# Patient Record
Sex: Male | Born: 1945 | Race: Black or African American | Hispanic: No | Marital: Married | Smoking: Former smoker
Health system: Southern US, Community
[De-identification: ages and names within clinical notes are randomized; demographics above are authoritative.]

## PROBLEM LIST (undated history)

## (undated) DIAGNOSIS — K219 Gastro-esophageal reflux disease without esophagitis: Secondary | ICD-10-CM

## (undated) DIAGNOSIS — I10 Essential (primary) hypertension: Secondary | ICD-10-CM

## (undated) DIAGNOSIS — E7800 Pure hypercholesterolemia, unspecified: Secondary | ICD-10-CM

## (undated) DIAGNOSIS — H409 Unspecified glaucoma: Secondary | ICD-10-CM

## (undated) DIAGNOSIS — D496 Neoplasm of unspecified behavior of brain: Secondary | ICD-10-CM

## (undated) DIAGNOSIS — C61 Malignant neoplasm of prostate: Secondary | ICD-10-CM

## (undated) DIAGNOSIS — K529 Noninfective gastroenteritis and colitis, unspecified: Secondary | ICD-10-CM

---

## 1992-10-23 HISTORY — PX: BRAIN TUMOR EXCISION: SHX577

## 2006-07-24 ENCOUNTER — Ambulatory Visit: Payer: Self-pay | Admitting: Gastroenterology

## 2006-07-24 ENCOUNTER — Ambulatory Visit (HOSPITAL_COMMUNITY): Admission: RE | Admit: 2006-07-24 | Discharge: 2006-07-24 | Payer: Self-pay | Admitting: Gastroenterology

## 2011-07-13 ENCOUNTER — Encounter: Payer: Self-pay | Admitting: Gastroenterology

## 2011-07-17 ENCOUNTER — Encounter: Payer: Self-pay | Admitting: Gastroenterology

## 2013-10-24 ENCOUNTER — Other Ambulatory Visit (HOSPITAL_COMMUNITY): Payer: Self-pay | Admitting: Urology

## 2013-10-24 DIAGNOSIS — C61 Malignant neoplasm of prostate: Secondary | ICD-10-CM

## 2013-10-28 ENCOUNTER — Other Ambulatory Visit: Payer: Self-pay | Admitting: Urology

## 2013-11-05 ENCOUNTER — Encounter (HOSPITAL_COMMUNITY): Payer: Self-pay

## 2013-11-07 ENCOUNTER — Encounter (HOSPITAL_COMMUNITY)
Admission: RE | Admit: 2013-11-07 | Discharge: 2013-11-07 | Disposition: A | Discharge status: Home / Self Care | Payer: 59 | Source: Ambulatory Visit | Attending: Urology | Admitting: Urology

## 2013-11-07 ENCOUNTER — Encounter (HOSPITAL_COMMUNITY): Payer: Self-pay | Admitting: Pharmacy Technician

## 2013-11-07 DIAGNOSIS — C61 Malignant neoplasm of prostate: Secondary | ICD-10-CM | POA: Insufficient documentation

## 2013-11-07 MED ORDER — TECHNETIUM TC 99M MEDRONATE IV KIT
25.0000 | PACK | Freq: Once | INTRAVENOUS | Status: AC | PRN
  Administered 2013-11-07: 25 via INTRAVENOUS

## 2013-11-11 ENCOUNTER — Encounter (HOSPITAL_COMMUNITY)
Admission: RE | Admit: 2013-11-11 | Discharge: 2013-11-11 | Disposition: A | Discharge status: Home / Self Care | Payer: 59 | Source: Ambulatory Visit | Attending: Urology | Admitting: Urology

## 2013-11-11 ENCOUNTER — Ambulatory Visit (HOSPITAL_COMMUNITY)
Admission: RE | Admit: 2013-11-11 | Discharge: 2013-11-11 | Disposition: A | Discharge status: Home / Self Care | Payer: 59 | Source: Ambulatory Visit | Attending: Urology | Admitting: Urology

## 2013-11-11 ENCOUNTER — Encounter (HOSPITAL_COMMUNITY): Payer: Self-pay

## 2013-11-11 ENCOUNTER — Other Ambulatory Visit (HOSPITAL_COMMUNITY): Payer: Self-pay | Admitting: Urology

## 2013-11-11 DIAGNOSIS — Z0183 Encounter for blood typing: Secondary | ICD-10-CM | POA: Insufficient documentation

## 2013-11-11 DIAGNOSIS — Z01810 Encounter for preprocedural cardiovascular examination: Secondary | ICD-10-CM | POA: Insufficient documentation

## 2013-11-11 DIAGNOSIS — Z01818 Encounter for other preprocedural examination: Secondary | ICD-10-CM | POA: Insufficient documentation

## 2013-11-11 DIAGNOSIS — R52 Pain, unspecified: Secondary | ICD-10-CM

## 2013-11-11 DIAGNOSIS — C61 Malignant neoplasm of prostate: Secondary | ICD-10-CM | POA: Insufficient documentation

## 2013-11-11 DIAGNOSIS — Z01812 Encounter for preprocedural laboratory examination: Secondary | ICD-10-CM | POA: Insufficient documentation

## 2013-11-11 DIAGNOSIS — M47814 Spondylosis without myelopathy or radiculopathy, thoracic region: Secondary | ICD-10-CM | POA: Insufficient documentation

## 2013-11-11 DIAGNOSIS — M259 Joint disorder, unspecified: Secondary | ICD-10-CM | POA: Insufficient documentation

## 2013-11-11 DIAGNOSIS — M949 Disorder of cartilage, unspecified: Secondary | ICD-10-CM

## 2013-11-11 DIAGNOSIS — M899 Disorder of bone, unspecified: Secondary | ICD-10-CM | POA: Insufficient documentation

## 2013-11-11 DIAGNOSIS — I10 Essential (primary) hypertension: Secondary | ICD-10-CM | POA: Insufficient documentation

## 2013-11-11 HISTORY — DX: Essential (primary) hypertension: I10

## 2013-11-11 HISTORY — DX: Gastro-esophageal reflux disease without esophagitis: K21.9

## 2013-11-11 HISTORY — DX: Malignant neoplasm of prostate: C61

## 2013-11-11 LAB — BASIC METABOLIC PANEL
BUN: 11 mg/dL (ref 6–23)
CALCIUM: 9.5 mg/dL (ref 8.4–10.5)
CO2: 27 mEq/L (ref 19–32)
Chloride: 103 mEq/L (ref 96–112)
Creatinine, Ser: 0.99 mg/dL (ref 0.50–1.35)
GFR calc Af Amer: >90 mL/min (ref >90)
GFR, EST NON AFRICAN AMERICAN: 84 mL/min — AB (ref >90)
Glucose, Bld: 109 mg/dL — ABNORMAL HIGH (ref 70–99)
Potassium: 3.4 mEq/L — ABNORMAL LOW (ref 3.7–5.3)
SODIUM: 143 meq/L (ref 137–147)

## 2013-11-11 LAB — CBC
HCT: 45.2 % (ref 39.0–52.0)
Hemoglobin: 15.3 g/dL (ref 13.0–17.0)
MCH: 29.2 pg (ref 26.0–34.0)
MCHC: 33.8 g/dL (ref 30.0–36.0)
MCV: 86.3 fL (ref 78.0–100.0)
PLATELETS: 298 10*3/uL (ref 150–400)
RBC: 5.24 MIL/uL (ref 4.22–5.81)
RDW: 13.8 % (ref 11.5–15.5)
WBC: 8.2 10*3/uL (ref 4.0–10.5)

## 2013-11-11 LAB — ABO/RH: ABO/RH(D): B POS

## 2013-11-11 NOTE — Patient Instructions (Signed)
Curtis Marquez  11/11/2013                           YOUR PROCEDURE IS SCHEDULED ON: 11/13/13               PLEASE REPORT TO SHORT STAY CENTER AT : 5:15 AM               CALL THIS NUMBER IF ANY PROBLEMS THE DAY OF SURGERY :               [phone]                      REMEMBER:   Do not eat food or drink liquids AFTER MIDNIGHT   Take these medicines the morning of surgery with A SIP OF WATER: OMEPRAZOLE   Do not wear jewelry, make-up   Do not wear lotions, powders, or perfumes.   Do not shave legs or underarms 12 hrs. before surgery (men may shave face)  Do not bring valuables to the hospital.  Contacts, dentures or bridgework may not be worn into surgery.  Leave suitcase in the car. After surgery it may be brought to your room.  For patients admitted to the hospital more than one night, checkout time is 11:00                          The day of discharge.   Patients discharged the day of surgery will not be allowed to drive home                             If going home same day of surgery, must have someone stay with you first                           24 hrs at home and arrange for some one to drive you home from hospital.    Special Instructions:   Please read over the following fact sheets that you were given:               1. [employer]ENT[employer]OM[employer]ER                      2. [place]                                                X_____________________________________________________________________        Failure to follow these instructions may result in cancellation of your surgery

## 2013-11-11 NOTE — Progress Notes (Signed)
11/11/13 1018  OBSTRUCTIVE SLEEP APNEA  Have you ever been diagnosed with sleep apnea through a sleep study? No  Do you snore loudly (loud enough to be heard through closed doors)?  1  Do you often feel tired, fatigued, or sleepy during the daytime? 0  Has anyone observed you stop breathing during your sleep? 1  Do you have, or are you being treated for high blood pressure? 1  BMI more than 35 kg/m2? 0  Age over 65 years old? 1  Neck circumference greater than 40 cm/18 inches? 0  Gender: 1  Obstructive Sleep Apnea Score 5  Score 4 or greater  Results sent to PCP

## 2013-11-12 NOTE — Anesthesia Preprocedure Evaluation (Addendum)
Anesthesia Evaluation  Patient identified by MRN, date of birth, ID band Patient awake    Reviewed: Allergy & Precautions, H&P , NPO status , Patient's Chart, lab work & pertinent test results  Airway Mallampati: II TM Distance: >3 FB Neck ROM: Full    Dental no notable dental hx.    Pulmonary neg pulmonary ROS,  breath sounds clear to auscultation  Pulmonary exam normal       Cardiovascular hypertension, Pt. on medications Rhythm:Regular Rate:Normal     Neuro/Psych negative neurological ROS  negative psych ROS   GI/Hepatic Neg liver ROS, GERD-  Medicated,  Endo/Other  negative endocrine ROS  Renal/GU negative Renal ROS  negative genitourinary   Musculoskeletal negative musculoskeletal ROS (+)   Abdominal   Peds negative pediatric ROS (+)  Hematology negative hematology ROS (+)   Anesthesia Other Findings   Reproductive/Obstetrics negative OB ROS                         Anesthesia Physical Anesthesia Plan  ASA: II  Anesthesia Plan: General   Post-op Pain Management:    Induction: Intravenous  Airway Management Planned: Oral ETT  Additional Equipment:   Intra-op Plan:   Post-operative Plan: Extubation in OR  Informed Consent: I have reviewed the patients History and Physical, chart, labs and discussed the procedure including the risks, benefits and alternatives for the proposed anesthesia with the patient or authorized representative who has indicated his/her understanding and acceptance.   Dental advisory given  Plan Discussed with: CRNA and Surgeon  Anesthesia Plan Comments:         Anesthesia Quick Evaluation

## 2013-11-12 NOTE — H&P (Signed)
Chief Complaint Prostate Cancer   Reason For Visit Reason for consult: To discuss treatment options for prostate cancer and specifically to consider a robotic prostatectomy.  Physician requesting consult: Dr. Clyde Lundborg  PCP: Dr. Monico Blitz   History of Present Illness Curtis Marquez is a 68 year old gentleman who was found to have an elevated PSA of 11.5. He was empirically treated with ciprofloxacin and his repeat PSA remained elevated at 12.3. He underwent a prostate biopsy for this reason on 09/23/13. 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 He has no family history of prostate cancer. He did undergo a CT scan of the pelvis on 10/03/13 which demonstrated no pelvic lymphadenopathy or other evidence of metastatic disease.    TNM stage: cT2a N0 M0 (L apical induration)  PSA: 12.3  Gleason score: 4+4=8  Biopsy (09/23/13): 12/16 cores positive    Left: L apex (4+4=8, 65% of tissue, 2/2 cores), L mid (4+3=7, 60% of tissue, 3/3 cores), L base (4+4=8, 5% of tissue, 1/3 cores)    Right: R apex (3+3=6, 6% of tissue, 1/2 cores), R mid (4+3=7, 30% of tissue, 3/3 cores), R base (4+3=7, 30%, 2/3 cores)  Prostate volume: 15.3 cc    Nomogram:  OC disease: 47%  EPE: 44%  SVI: 32%  LNI: 6.7%  PFS (surgery): 64% at 5 years, 50% at 10 years    Urinary function: He does describe some moderate lower urinary tract symptoms including a weak stream, urinary frequency, nocturia, and urgency. IPSS is 9. Overall, his symptoms are not particularly bothersome to him.  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 He states that he can attain an erection adequate for intercourse approximately 50% of the time. He has not previously undergone medical therapy. SHIM score is 17.   Past Medical History Problems  1. History of gastroesophageal reflux (GERD) (V12.79) 2. History of hypercholesterolemia  (V12.29) 3. History of hypertension (V12.59)  Surgical History Problems  1. 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   Current Meds 1. Allergy TABS;  Therapy: (Recorded:02Jan2015) to Recorded 2. Benicar HCT 40-12.5 MG Oral Tablet;  Therapy: (Recorded:02Jan2015) to Recorded 3. CloNIDine HCl TABS;  Therapy: (UYQIHKVQ:[id number]) to Recorded 4. Hydrochlorothiazide 25 MG Oral Tablet;  Therapy: (IEPPIRJJ:[id number]) to Recorded 5. Omeprazole 20 MG Oral Capsule Delayed Release;  Therapy: (Recorded:02Jan2015) to Recorded 6. Tamsulosin HCl - 0.4 MG Oral Capsule;  Therapy: (Recorded:02Jan2015) to Recorded  Allergies Medication  1. No Known Drug Allergies  Family History Problems  1. No pertinent family history : Mother  Social History Problems    Denied: History of Alcohol use   Caffeine use (V49.89)   1 cup   Former smoker [place])  Review of Systems Constitutional, skin, eye, otolaryngeal, hematologic/lymphatic, cardiovascular, pulmonary, endocrine, musculoskeletal, gastrointestinal, neurological and psychiatric system(s) were reviewed and pertinent findings if present are noted.  Gastrointestinal: heartburn.  ENT: sinus problems.  Musculoskeletal: back pain.  Neurological: headache.    Vitals Vital Signs [Data Includes: Last 1 Day]  Recorded: 02Jan2015 11:55AM  Height: 5 ft 8 in Weight: 185 lb  BMI Calculated: 28.13 BSA Calculated: 1.98 Blood Pressure: 209 / 120 Temperature: 98.1 F Heart Rate: 88  Physical Exam Constitutional: Well nourished and well developed . No acute distress.  ENT:. The ears and nose are normal in appearance.  Neck: The appearance of the neck is normal and  no neck mass is present.  Pulmonary: No respiratory distress, normal respiratory rhythm and effort and clear bilateral breath sounds.  Cardiovascular: Heart rate and rhythm are normal . No peripheral edema.  Abdomen: The  abdomen is rounded. The abdomen is soft and nontender. No masses are palpated. No CVA tenderness. No hernias are palpable. No hepatosplenomegaly noted.  Rectal: Rectal exam demonstrates normal sphincter tone, no tenderness and no masses. Prostate size is estimated to be 35 g. There is induration noted toward the left apex of the prostate. There is no definite extraprostatic extension or disease identified. The prostate is not tender. The left seminal vesicle is nonpalpable. The right seminal vesicle is nonpalpable. The perineum is normal on inspection.  Lymphatics: The femoral and inguinal nodes are not enlarged or tender.  Skin: Normal skin turgor, no visible rash and no visible skin lesions.  Neuro/Psych:. Mood and affect are appropriate.    Results/Data Urine [Data Includes: Last 1 Day]   [id number]  COLOR YELLOW   APPEARANCE CLEAR   SPECIFIC GRAVITY 1.015   pH 7.0   GLUCOSE NEG mg/dL  BILIRUBIN NEG   KETONE NEG mg/dL  BLOOD SMALL   PROTEIN NEG mg/dL  UROBILINOGEN 0.2 mg/dL  NITRITE NEG   LEUKOCYTE ESTERASE TRACE   SQUAMOUS EPITHELIAL/HPF RARE   WBC 0-2 WBC/hpf  RBC NONE SEEN RBC/hpf  BACTERIA NONE SEEN   CRYSTALS NONE SEEN   CASTS NONE SEEN   Other OCCASIONAL TRICHOMONADS    I have reviewed his medical records, PSA result, pathology report, and CT scan of the pelvis. Findings are dictated above.   Assessment Assessed  1. Prostate cancer (185)  Plan Health Maintenance  1. UA With REFLEX; [Do Not Release]; Status:Complete;   Done: [id number] 11:50AM Prostate cancer  2. Follow-up Office  Follow-up - Will call to schedule surgery  Status: Hold For -  Appointment,Date of Service  Requested for: 02Jan2015 3. PT/OT Referral Referral  Referral  Status: Hold For - Appointment,PreCert,Date of  Service,Physical Therapy  Requested for: [id number] 4. BONE SCAN; Status:Hold For - Appointment,PreCert,Print,Records; Requested  for:02Jan2015;   Discussion/Summary 1. Prostate cancer: I  had a detailed discussion with Mr. Rosas and his wife today regarding his prostate cancer diagnosis.   The patient was counseled about the natural history of prostate cancer and the standard treatment options that are available for prostate cancer. It was explained to him how his age and life expectancy, clinical stage, Gleason score, and PSA affect his prognosis, the decision to proceed with additional staging studies, as well as how that information influences recommended treatment strategies. We discussed the roles for active surveillance, radiation therapy, surgical therapy, androgen deprivation, as well as ablative therapy options for the treatment of prostate cancer as appropriate to his individual cancer situation. We discussed the risks and benefits of these options with regard to their impact on cancer control and also in terms of potential adverse events, complications, and impact on quiality of life particularly related to urinary, bowel, and sexual function. The patient was encouraged to ask questions throughout the discussion today and all questions were answered to his stated satisfaction. In addition, the patient was provided with and/or directed to appropriate resources and literature for further education about prostate cancer and treatment options.   We discussed surgical therapy for prostate cancer including the different available surgical approaches. We discussed, in detail, the risks and expectations of surgery with regard to cancer control, urinary control, and erectile function as well as the expected postoperative  recovery process. Additional risks of surgery including but not limited to bleeding, infection, hernia formation, nerve damage, lymphocele formation, bowel/rectal injury potentially necessitating colostomy, damage to the urinary tract resulting in urine leakage, urethral stricture, and the cardiopulmonary risks such as myocardial infarction, stroke, death, venothromboembolism,  etc. were explained. The risk of open surgical conversion for robotic/laparoscopic prostatectomy was also discussed.     He will proceed with a bone scan to complete his staging evaluation considering his high-risk disease. Assuming this is negative, we discussed the options of primary surgical therapy versus external beam radiation therapy/androgen deprivation therapy combination is reasonable treatment options. We reviewed the pros and cons of each approach and I did offer him an appointment in the multidisciplinary prostate cancer clinic next week to further review his options and specifically to be seen by radiation oncology. After our discussion today, he adamantly wishes to proceed with primary surgical therapy. Assuming that his bone scan is negative, he will be tentatively scheduled for a robotic-assisted laparoscopic radical prostatectomy and bilateral pelvic lymphadenectomy. We have discussed performing a right nerve sparing procedure although he understands that we will not take any chances compromising his cancer care if there are any findings to suggest more advanced disease on that side. He agrees to proceed with a wide excision on the left side of the prostate considering his digital rectal exam today.    Cc: Dr. Monico Blitz  Dr. Clyde Lundborg    SignaturesElectronically signed by : Raynelle Bring, M.D.; Oct 24 2013  1:13PM EST

## 2013-11-13 ENCOUNTER — Inpatient Hospital Stay (HOSPITAL_COMMUNITY)
Admission: RE | Admit: 2013-11-13 | Discharge: 2013-11-14 | DRG: 708 | Disposition: A | Discharge status: Home / Self Care | Payer: 59 | Source: Ambulatory Visit | Attending: Urology | Admitting: Urology

## 2013-11-13 ENCOUNTER — Encounter (HOSPITAL_COMMUNITY)
Admission: RE | Disposition: A | Discharge status: Home / Self Care | Payer: Self-pay | Source: Ambulatory Visit | Attending: Urology

## 2013-11-13 ENCOUNTER — Encounter (HOSPITAL_COMMUNITY): Payer: Self-pay | Admitting: *Deleted

## 2013-11-13 ENCOUNTER — Inpatient Hospital Stay (HOSPITAL_COMMUNITY): Payer: 59 | Admitting: Anesthesiology

## 2013-11-13 ENCOUNTER — Encounter (HOSPITAL_COMMUNITY): Payer: 59 | Admitting: Anesthesiology

## 2013-11-13 DIAGNOSIS — N529 Male erectile dysfunction, unspecified: Secondary | ICD-10-CM | POA: Diagnosis present

## 2013-11-13 DIAGNOSIS — R351 Nocturia: Secondary | ICD-10-CM | POA: Diagnosis present

## 2013-11-13 DIAGNOSIS — R3989 Other symptoms and signs involving the genitourinary system: Secondary | ICD-10-CM | POA: Diagnosis present

## 2013-11-13 DIAGNOSIS — C61 Malignant neoplasm of prostate: Secondary | ICD-10-CM

## 2013-11-13 DIAGNOSIS — Z87891 Personal history of nicotine dependence: Secondary | ICD-10-CM

## 2013-11-13 DIAGNOSIS — E7800 Pure hypercholesterolemia, unspecified: Secondary | ICD-10-CM | POA: Diagnosis present

## 2013-11-13 DIAGNOSIS — I10 Essential (primary) hypertension: Secondary | ICD-10-CM | POA: Diagnosis present

## 2013-11-13 DIAGNOSIS — R39198 Other difficulties with micturition: Secondary | ICD-10-CM | POA: Diagnosis present

## 2013-11-13 DIAGNOSIS — R350 Frequency of micturition: Secondary | ICD-10-CM | POA: Diagnosis present

## 2013-11-13 DIAGNOSIS — K219 Gastro-esophageal reflux disease without esophagitis: Secondary | ICD-10-CM | POA: Diagnosis present

## 2013-11-13 HISTORY — PX: ROBOT ASSISTED LAPAROSCOPIC RADICAL PROSTATECTOMY: SHX5141

## 2013-11-13 HISTORY — PX: LYMPHADENECTOMY: SHX5960

## 2013-11-13 HISTORY — DX: Malignant neoplasm of prostate: C61

## 2013-11-13 LAB — TYPE AND SCREEN
ABO/RH(D): B POS
Antibody Screen: NEGATIVE

## 2013-11-13 LAB — HEMOGLOBIN AND HEMATOCRIT, BLOOD
HCT: 40.3 % (ref 39.0–52.0)
Hemoglobin: 13.6 g/dL (ref 13.0–17.0)

## 2013-11-13 SURGERY — ROBOTIC ASSISTED LAPAROSCOPIC RADICAL PROSTATECTOMY LEVEL 2
Anesthesia: General

## 2013-11-13 MED ORDER — MIDAZOLAM HCL 2 MG/2ML IJ SOLN
INTRAMUSCULAR | Status: AC
  Filled 2013-11-13: qty 2

## 2013-11-13 MED ORDER — BUPIVACAINE-EPINEPHRINE 0.25% -1:200000 IJ SOLN
INTRAMUSCULAR | Status: DC | PRN
  Administered 2013-11-13: 30 mL

## 2013-11-13 MED ORDER — FENTANYL CITRATE 0.05 MG/ML IJ SOLN
INTRAMUSCULAR | Status: AC
  Filled 2013-11-13: qty 5

## 2013-11-13 MED ORDER — HYDROCHLOROTHIAZIDE 25 MG PO TABS
25.0000 mg | ORAL_TABLET | Freq: Every day | ORAL | Status: DC
  Administered 2013-11-14: 25 mg via ORAL
  Filled 2013-11-13: qty 1

## 2013-11-13 MED ORDER — GLYCOPYRROLATE 0.2 MG/ML IJ SOLN
INTRAMUSCULAR | Status: AC
  Filled 2013-11-13: qty 3

## 2013-11-13 MED ORDER — MIDAZOLAM HCL 5 MG/5ML IJ SOLN
INTRAMUSCULAR | Status: DC | PRN
  Administered 2013-11-13: 2 mg via INTRAVENOUS

## 2013-11-13 MED ORDER — CEFAZOLIN SODIUM-DEXTROSE 2-3 GM-% IV SOLR
INTRAVENOUS | Status: AC
  Filled 2013-11-13: qty 50

## 2013-11-13 MED ORDER — OLMESARTAN MEDOXOMIL-HCTZ 40-25 MG PO TABS: 1.0000 | ORAL_TABLET | Freq: Every morning | ORAL | Status: DC

## 2013-11-13 MED ORDER — CEFAZOLIN SODIUM 1-5 GM-% IV SOLN
1.0000 g | Freq: Three times a day (TID) | INTRAVENOUS | Status: AC
  Administered 2013-11-13 – 2013-11-14 (×2): 1 g via INTRAVENOUS
  Filled 2013-11-13 (×2): qty 50

## 2013-11-13 MED ORDER — ONDANSETRON HCL 4 MG/2ML IJ SOLN
INTRAMUSCULAR | Status: DC | PRN
  Administered 2013-11-13: 4 mg via INTRAVENOUS

## 2013-11-13 MED ORDER — PHENYLEPHRINE 40 MCG/ML (10ML) SYRINGE FOR IV PUSH (FOR BLOOD PRESSURE SUPPORT)
PREFILLED_SYRINGE | INTRAVENOUS | Status: AC
  Filled 2013-11-13: qty 10

## 2013-11-13 MED ORDER — KETOROLAC TROMETHAMINE 15 MG/ML IJ SOLN
15.0000 mg | Freq: Four times a day (QID) | INTRAMUSCULAR | Status: DC
  Administered 2013-11-13 – 2013-11-14 (×4): 15 mg via INTRAVENOUS
  Filled 2013-11-13 (×6): qty 1

## 2013-11-13 MED ORDER — BUPIVACAINE-EPINEPHRINE PF 0.25-1:200000 % IJ SOLN
INTRAMUSCULAR | Status: AC
  Filled 2013-11-13: qty 30

## 2013-11-13 MED ORDER — MORPHINE SULFATE 2 MG/ML IJ SOLN: 2.0000 mg | INTRAMUSCULAR | Status: DC | PRN

## 2013-11-13 MED ORDER — IRBESARTAN 300 MG PO TABS
300.0000 mg | ORAL_TABLET | Freq: Every day | ORAL | Status: DC
  Administered 2013-11-14: 300 mg via ORAL
  Filled 2013-11-13: qty 1

## 2013-11-13 MED ORDER — ROCURONIUM BROMIDE 100 MG/10ML IV SOLN
INTRAVENOUS | Status: DC | PRN
  Administered 2013-11-13 (×2): 10 mg via INTRAVENOUS
  Administered 2013-11-13: 60 mg via INTRAVENOUS

## 2013-11-13 MED ORDER — PANTOPRAZOLE SODIUM 40 MG PO TBEC
40.0000 mg | DELAYED_RELEASE_TABLET | Freq: Every day | ORAL | Status: DC
Start: 2013-11-13 — End: 2013-11-14
  Administered 2013-11-13 – 2013-11-14 (×2): 40 mg via ORAL
  Filled 2013-11-13 (×2): qty 1

## 2013-11-13 MED ORDER — KCL IN DEXTROSE-NACL 20-5-0.45 MEQ/L-%-% IV SOLN
INTRAVENOUS | Status: DC
  Administered 2013-11-13 – 2013-11-14 (×3): via INTRAVENOUS
  Filled 2013-11-13 (×5): qty 1000

## 2013-11-13 MED ORDER — CEFAZOLIN SODIUM-DEXTROSE 2-3 GM-% IV SOLR
2.0000 g | INTRAVENOUS | Status: AC
  Administered 2013-11-13: 2 g via INTRAVENOUS

## 2013-11-13 MED ORDER — KCL IN DEXTROSE-NACL 20-5-0.45 MEQ/L-%-% IV SOLN
INTRAVENOUS | Status: AC
  Filled 2013-11-13: qty 1000

## 2013-11-13 MED ORDER — ONDANSETRON HCL 4 MG/2ML IJ SOLN
INTRAMUSCULAR | Status: AC
  Filled 2013-11-13: qty 2

## 2013-11-13 MED ORDER — NEOSTIGMINE METHYLSULFATE 1 MG/ML IJ SOLN
INTRAMUSCULAR | Status: AC
  Filled 2013-11-13: qty 10

## 2013-11-13 MED ORDER — DIPHENHYDRAMINE HCL 50 MG/ML IJ SOLN: 12.5000 mg | Freq: Four times a day (QID) | INTRAMUSCULAR | Status: DC | PRN

## 2013-11-13 MED ORDER — DIPHENHYDRAMINE HCL 12.5 MG/5ML PO ELIX
12.5000 mg | ORAL_SOLUTION | Freq: Four times a day (QID) | ORAL | Status: DC | PRN
Start: 2013-11-13 — End: 2013-11-14

## 2013-11-13 MED ORDER — FENTANYL CITRATE 0.05 MG/ML IJ SOLN
INTRAMUSCULAR | Status: DC | PRN
  Administered 2013-11-13: 100 ug via INTRAVENOUS
  Administered 2013-11-13 (×3): 50 ug via INTRAVENOUS

## 2013-11-13 MED ORDER — SODIUM CHLORIDE 0.9 % IR SOLN
Status: DC | PRN
  Administered 2013-11-13: 1000 mL via INTRAVESICAL

## 2013-11-13 MED ORDER — LIDOCAINE HCL (CARDIAC) 20 MG/ML IV SOLN
INTRAVENOUS | Status: DC | PRN
  Administered 2013-11-13: 80 mg via INTRAVENOUS

## 2013-11-13 MED ORDER — ACETAMINOPHEN 325 MG PO TABS
650.0000 mg | ORAL_TABLET | ORAL | Status: DC | PRN
  Administered 2013-11-13: 650 mg via ORAL
  Filled 2013-11-13: qty 2

## 2013-11-13 MED ORDER — EPHEDRINE SULFATE 50 MG/ML IJ SOLN
INTRAMUSCULAR | Status: DC | PRN
  Administered 2013-11-13 (×2): 5 mg via INTRAVENOUS

## 2013-11-13 MED ORDER — HEPARIN SODIUM (PORCINE) 1000 UNIT/ML IJ SOLN
INTRAMUSCULAR | Status: AC
  Filled 2013-11-13: qty 1

## 2013-11-13 MED ORDER — NEOSTIGMINE METHYLSULFATE 1 MG/ML IJ SOLN
INTRAMUSCULAR | Status: DC | PRN
  Administered 2013-11-13: 4 mg via INTRAVENOUS

## 2013-11-13 MED ORDER — LACTATED RINGERS IV SOLN
INTRAVENOUS | Status: DC | PRN
  Administered 2013-11-13 (×2): via INTRAVENOUS

## 2013-11-13 MED ORDER — SODIUM CHLORIDE 0.9 % IJ SOLN
INTRAMUSCULAR | Status: AC
  Filled 2013-11-13: qty 10

## 2013-11-13 MED ORDER — HYDROCODONE-ACETAMINOPHEN 5-325 MG PO TABS: 1.0000 | ORAL_TABLET | Freq: Four times a day (QID) | ORAL | Status: DC | PRN

## 2013-11-13 MED ORDER — HYDROMORPHONE HCL PF 1 MG/ML IJ SOLN
INTRAMUSCULAR | Status: AC
  Administered 2013-11-13: 12:00:00
  Filled 2013-11-13: qty 1

## 2013-11-13 MED ORDER — PROPOFOL 10 MG/ML IV BOLUS
INTRAVENOUS | Status: DC | PRN
  Administered 2013-11-13: 160 mg via INTRAVENOUS

## 2013-11-13 MED ORDER — SODIUM CHLORIDE 0.9 % IV BOLUS (SEPSIS)
1000.0000 mL | Freq: Once | INTRAVENOUS | Status: AC
  Administered 2013-11-13: 1000 mL via INTRAVENOUS

## 2013-11-13 MED ORDER — HYDROMORPHONE HCL PF 2 MG/ML IJ SOLN
INTRAMUSCULAR | Status: AC
  Filled 2013-11-13: qty 1

## 2013-11-13 MED ORDER — DOCUSATE SODIUM 100 MG PO CAPS
100.0000 mg | ORAL_CAPSULE | Freq: Two times a day (BID) | ORAL | Status: DC
  Administered 2013-11-13 – 2013-11-14 (×2): 100 mg via ORAL
  Filled 2013-11-13 (×3): qty 1

## 2013-11-13 MED ORDER — PROMETHAZINE HCL 25 MG/ML IJ SOLN: 6.2500 mg | INTRAMUSCULAR | Status: DC | PRN

## 2013-11-13 MED ORDER — ROCURONIUM BROMIDE 100 MG/10ML IV SOLN
INTRAVENOUS | Status: AC
  Filled 2013-11-13: qty 1

## 2013-11-13 MED ORDER — HYDROMORPHONE HCL PF 1 MG/ML IJ SOLN
INTRAMUSCULAR | Status: AC
  Administered 2013-11-13: 11:00:00
  Filled 2013-11-13: qty 1

## 2013-11-13 MED ORDER — LACTATED RINGERS IV SOLN
INTRAVENOUS | Status: DC | PRN
  Administered 2013-11-13: 08:00:00

## 2013-11-13 MED ORDER — LIDOCAINE HCL (CARDIAC) 20 MG/ML IV SOLN
INTRAVENOUS | Status: AC
  Filled 2013-11-13: qty 5

## 2013-11-13 MED ORDER — PROPOFOL 10 MG/ML IV BOLUS
INTRAVENOUS | Status: AC
  Filled 2013-11-13: qty 20

## 2013-11-13 MED ORDER — GLYCOPYRROLATE 0.2 MG/ML IJ SOLN
INTRAMUSCULAR | Status: DC | PRN
  Administered 2013-11-13: 0.6 mg via INTRAVENOUS

## 2013-11-13 MED ORDER — EPHEDRINE SULFATE 50 MG/ML IJ SOLN
INTRAMUSCULAR | Status: AC
  Filled 2013-11-13: qty 1

## 2013-11-13 MED ORDER — CIPROFLOXACIN HCL 500 MG PO TABS: 500.0000 mg | ORAL_TABLET | Freq: Two times a day (BID) | ORAL | Status: DC

## 2013-11-13 MED ORDER — HYDROMORPHONE HCL PF 1 MG/ML IJ SOLN
0.2500 mg | INTRAMUSCULAR | Status: DC | PRN
  Administered 2013-11-13 (×4): 0.5 mg via INTRAVENOUS

## 2013-11-13 SURGICAL SUPPLY — 44 items
ADH SKN CLS APL DERMABOND .7 (GAUZE/BANDAGES/DRESSINGS) ×2
CABLE HIGH FREQUENCY MONO STRZ (ELECTRODE) ×4 IMPLANT
CANISTER SUCTION 2500CC (MISCELLANEOUS) ×1 IMPLANT
CATH FOLEY 2WAY SLVR 18FR 30CC (CATHETERS) ×4 IMPLANT
CATH ROBINSON RED A/P 16FR (CATHETERS) ×4 IMPLANT
CATH ROBINSON RED A/P 8FR (CATHETERS) ×4 IMPLANT
CATH TIEMANN FOLEY 18FR 5CC (CATHETERS) ×4 IMPLANT
CHLORAPREP W/TINT 26ML (MISCELLANEOUS) ×4 IMPLANT
CLIP LIGATING HEM O LOK PURPLE (MISCELLANEOUS) ×8 IMPLANT
CLOTH BEACON ORANGE TIMEOUT ST (SAFETY) ×4 IMPLANT
COVER SURGICAL LIGHT HANDLE (MISCELLANEOUS) ×4 IMPLANT
COVER TIP SHEARS 8 DVNC (MISCELLANEOUS) ×2 IMPLANT
COVER TIP SHEARS 8MM DA VINCI (MISCELLANEOUS) ×2
CUTTER ECHEON FLEX ENDO 45 340 (ENDOMECHANICALS) ×4 IMPLANT
DECANTER SPIKE VIAL GLASS SM (MISCELLANEOUS) ×2 IMPLANT
DERMABOND ADVANCED (GAUZE/BANDAGES/DRESSINGS) ×2
DERMABOND ADVANCED .7 DNX12 (GAUZE/BANDAGES/DRESSINGS) IMPLANT
DRAPE SURG IRRIG POUCH 19X23 (DRAPES) ×4 IMPLANT
DRSG TEGADERM 4X4.75 (GAUZE/BANDAGES/DRESSINGS) ×4 IMPLANT
DRSG TEGADERM 6X8 (GAUZE/BANDAGES/DRESSINGS) ×8 IMPLANT
ELECT REM PT RETURN 9FT ADLT (ELECTROSURGICAL) ×4
ELECTRODE REM PT RTRN 9FT ADLT (ELECTROSURGICAL) ×2 IMPLANT
GLOVE BIO SURGEON STRL SZ 6.5 (GLOVE) ×3 IMPLANT
GLOVE BIO SURGEONS STRL SZ 6.5 (GLOVE) ×1
GLOVE BIOGEL M STRL SZ7.5 (GLOVE) ×16 IMPLANT
GOWN STRL REUS W/TWL LRG LVL3 (GOWN DISPOSABLE) ×18 IMPLANT
GOWN STRL REUS W/TWL XL LVL3 (GOWN DISPOSABLE) ×5 IMPLANT
HOLDER FOLEY CATH W/STRAP (MISCELLANEOUS) ×4 IMPLANT
IV LACTATED RINGERS 1000ML (IV SOLUTION) ×1 IMPLANT
KIT ACCESSORY DA VINCI DISP (KITS) ×2
KIT ACCESSORY DVNC DISP (KITS) ×2 IMPLANT
NDL SAFETY ECLIPSE 18X1.5 (NEEDLE) ×2 IMPLANT
NEEDLE HYPO 18GX1.5 SHARP (NEEDLE) ×4
PACK ROBOT UROLOGY CUSTOM (CUSTOM PROCEDURE TRAY) ×4 IMPLANT
RELOAD GREEN ECHELON 45 (STAPLE) ×4 IMPLANT
SET TUBE IRRIG SUCTION NO TIP (IRRIGATION / IRRIGATOR) ×4 IMPLANT
SOLUTION ELECTROLUBE (MISCELLANEOUS) ×4 IMPLANT
SUT ETHILON 3 0 PS 1 (SUTURE) ×4 IMPLANT
SUT MNCRL AB 4-0 PS2 18 (SUTURE) ×8 IMPLANT
SUT VICRYL 0 UR6 27IN ABS (SUTURE) ×8 IMPLANT
SYR 27GX1/2 1ML LL SAFETY (SYRINGE) ×4 IMPLANT
TOWEL OR 17X26 10 PK STRL BLUE (TOWEL DISPOSABLE) ×4 IMPLANT
TOWEL OR NON WOVEN STRL DISP B (DISPOSABLE) ×4 IMPLANT
WATER STERILE IRR 1500ML POUR (IV SOLUTION) ×8 IMPLANT

## 2013-11-13 NOTE — Discharge Instructions (Signed)
1. Activity:  You are encouraged to ambulate frequently (about every hour during waking hours) to help prevent blood clots from forming in your legs or lungs.  However, you should not engage in any heavy lifting (> 10-15 lbs), strenuous activity, or straining. 2. Diet: You should continue a clear liquid diet until passing gas from below.  Once this occurs, you may advance your diet to a soft diet that would be easy to digest (i.e soups, scrambled eggs, mashed potatoes, etc.) for 24 hours just as you would if getting over a bad stomach flu.  If tolerating this diet well for 24 hours, you may then begin eating regular food.  It will be normal to have some amount of bloating, nausea, and abdominal discomfort intermittently. 3. Prescriptions:  You will be provided a prescription for pain medication to take as needed.  If your pain is not severe enough to require the prescription pain medication, you may take extra strength Tylenol instead.  You should also take an over the counter stool softener (Colace 100 mg twice daily) to avoid straining with bowel movements as the pain medication may constipate you. Finally, you will also be provided a prescription for an antibiotic to begin the day prior to your return visit in the office for catheter removal. 4. Catheter care: You will be taught how to take care of the catheter by the nursing staff prior to discharge from the hospital.  You may use both a leg bag and the larger bedside bag but it is recommended to at least use the bigger bedside bag at nighttime as the leg bag is small and will fill up overnight and also does not drain as well when lying flat. You may periodically feel a strong urge to void with the catheter in place.  This is a bladder spasm and most often can occur when having a bowel movement or when you are moving around. It is typically self-limited and usually will stop after a few minutes.  You may use some Vaseline or Neosporin around the tip of the  catheter to reduce friction at the tip of the penis. 5. Incisions: You may remove your dressing bandages the 2nd day after surgery.  You most likely will have a few small staples in each of the incisions and once the bandages are removed, the incisions may stay open to air.  You may start showering (not soaking or bathing in water) 48 hours after surgery and the incisions simply need to be patted dry after the shower.  No additional care is needed. 6. What to call us about: You should call the office [phone]) if you develop fever > 101, persistent vomiting, or the catheter stops draining. Also, feel free to call with any other questions you may have and remember the handout that was provided to you as a reference preoperatively which answers many of the common questions that arise after surgery.  You may resume aspirin, vitamins, supplements, and advil 7 days after surgery.

## 2013-11-13 NOTE — Op Note (Signed)
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 Robotic assisted laparoscopic radical prostatectomy (right nerve sparing) 2. Bilateral robotic assisted laparoscopic pelvic lymphadenectomy  Surgeon: [name]. M.D.  Assistant(s): Leta Baptist, PA-C  Anesthesia: General  Complications: None  EBL: 150 mL  IVF:  1500 mL crystalloid  Specimens: 1. Prostate and seminal vesicles 2. Right pelvic lymph nodes 3. Left pelvic lymph nodes  Disposition of specimens: Pathology  Drains: 1. 20 Fr coude catheter 2. # 19 Blake pelvic drain  Indication: Curtis Marquez is a 68 y.o. patient with clinically localized prostate cancer.  After a thorough review of the management options for treatment of prostate cancer, he elected to proceed with surgical therapy and the above procedure(s).  We have discussed the potential benefits and risks of the procedure, side effects of the proposed treatment, the likelihood of the patient achieving the goals of the procedure, and any potential problems that might occur during the procedure or recuperation. Informed consent has been obtained.  Description of procedure:  The patient was taken to the operating room and a general anesthetic was administered. He was given preoperative antibiotics, placed in the dorsal lithotomy position, and prepped and draped in the usual sterile fashion. Next a preoperative timeout was performed. A urethral catheter was placed into the bladder and a site was selected near the umbilicus for placement of the camera port. This was placed using a standard open Hassan technique which allowed entry into the peritoneal cavity under direct vision and without difficulty. A 12 mm port was placed and a pneumoperitoneum established. The camera was then used to  inspect the abdomen and there was no evidence of any intra-abdominal injuries or other abnormalities. The remaining abdominal ports were then placed. 8 mm robotic ports were placed in the right lower quadrant, left lower quadrant, and far left lateral abdominal wall. A 5 mm port was placed in the right upper quadrant and a 12 mm port was placed in the right lateral abdominal wall for laparoscopic assistance. All ports were placed under direct vision without difficulty. The surgical cart was then docked.   Utilizing the cautery scissors, the bladder was reflected posteriorly allowing entry into the space of Retzius and identification of the endopelvic fascia and prostate. The periprostatic fat was then removed from the prostate allowing full exposure of the endopelvic fascia. The endopelvic fascia was then incised from the apex back to the base of the prostate bilaterally and the underlying levator muscle fibers were swept laterally off the prostate thereby isolating the dorsal venous complex. The dorsal vein was then stapled and divided with a 45 mm Flex Echelon stapler. Attention then turned to the bladder neck which was divided anteriorly thereby allowing entry into the bladder and exposure of the urethral catheter. The catheter balloon was deflated and the catheter was brought into the operative field and used to retract the prostate anteriorly. The posterior bladder neck was then examined and was divided allowing further dissection between the bladder and prostate posteriorly until the vasa deferentia and seminal vessels were identified. The vasa deferentia were isolated, divided, and lifted anteriorly. The seminal vesicles were dissected down to their tips with care to control the seminal vascular arterial blood supply. These structures were then lifted anteriorly and the space between Denonvillier's fascia and the anterior rectum was developed with a combination of sharp and  blunt dissection. This isolated  the vascular pedicles of the prostate.  The lateral prostatic fascia on the right side of the prostate was then sharply incised allowing release of the neurovascular bundle. The vascular pedicle of the prostate on the right side was then ligated with Weck clips between the prostate and neurovascular bundle and divided with sharp cold scissor dissection resulting in neurovascular bundle preservation. On the left side, a wide non nerve sparing dissection was performed with Weck clips used to ligate the vascular pedicle of the prostate. The neurovascular bundle on the right side was then separated off the apex of the prostate and urethra.  The urethra was then sharply transected allowing the prostate specimen to be disarticulated. The pelvis was copiously irrigated and hemostasis was ensured. There was no evidence for rectal injury.  Attention then turned to the right pelvic sidewall. The fibrofatty tissue between the external iliac vein, confluence of the iliac vessels, hypogastric artery, and Cooper's ligament was dissected free from the pelvic sidewall with care to preserve the obturator nerve. Weck clips were used for lymphostasis and hemostasis. An identical procedure was performed on the contralateral side and the lymphatic packets were removed for permanent pathologic analysis.  Attention then turned to the urethral anastomosis. A 2-0 Vicryl slip knot was placed between Denonvillier's fascia, the posterior bladder neck, and the posterior urethra to reapproximate these structures. A double-armed 3-0 Monocryl suture was then used to perform a 360 running tension-free anastomosis between the bladder neck and urethra. A new urethral catheter was then placed into the bladder and irrigated. There were no blood clots within the bladder and the anastomosis appeared to be watertight. A #19 Blake drain was then brought through the left lateral 8 mm port site and positioned appropriately within the pelvis. It was  secured to the skin with a nylon suture. The surgical cart was then undocked. The right lateral 12 mm port site was closed at the fascial level with a 0 Vicryl suture placed laparoscopically. All remaining ports were then removed under direct vision. The prostate specimen was removed intact within the Endopouch retrieval bag via the periumbilical camera port site. This fascial opening was closed with two running 0 Vicryl sutures. 0.25% Marcaine was then injected into all port sites and all incisions were reapproximated at the skin level with 4-0 Monocryl subcuticular sutures and Dermabond. Sterile dressings were applied. The patient appeared to tolerate the procedure well and without complications. The patient was able to be extubated and transferred to the recovery unit in satisfactory condition.   [name] MD

## 2013-11-13 NOTE — Interval H&P Note (Signed)
History and Physical Interval Note:  11/13/2013 7:23 AM  Curtis Marquez  has presented today for surgery, with the diagnosis of PROSTATE CANCER  The various methods of treatment have been discussed with the patient and family. After consideration of risks, benefits and other options for treatment, the patient has consented to  Procedure(s): ROBOTIC ASSISTED LAPAROSCOPIC RADICAL PROSTATECTOMY LEVEL 2 (N/A) LYMPHADENECTOMY "PELVIC LYMPH NODE DISSECTION" (Bilateral) as a surgical intervention .  The patient's history has been reviewed, patient examined, no change in status, stable for surgery.  I have reviewed the patient's chart and labs.  Questions were answered to the patient's satisfaction.    We have discussed his bone scan and plain film findings.  There is a suspicion for possible metastatic disease but this is far from definite.  After discussion, the patient wishes to proceed with an attempt at curative therapy and this would appear to be very reasonable.   Zanyla Klebba,LES

## 2013-11-13 NOTE — Progress Notes (Signed)
Patient ID: Curtis Marquez, male   DOB: [birth year], 68 y.o.   MRN: [id number] Post-op note  Subjective: The patient is doing well.  No complaints.  Denies N/V. Wants to amb.  Objective: Vital signs in last 24 hours: Temp:  [97.6 F (36.4 C)-98 F (36.7 C)] 98 F (36.7 C) (01/22 1145) Pulse Rate:  [75-92] 86 (01/22 1145) Resp:  [15-18] 18 (01/22 1145) BP: (145-191)/(82-115) 191/106 mmHg (01/22 1145) SpO2:  [99 %-100 %] 99 % (01/22 1145) Weight:  [82.555 kg (182 lb)-88.633 kg (195 lb 6.4 oz)] 88.633 kg (195 lb 6.4 oz) (01/22 1145)  Intake/Output from previous day: 01/21 0701 - 01/22 0700 In: -  Out: 275 [Urine:275] Intake/Output this shift: Total I/O In: 2300 [I.V.:1600; IV Piggyback:700] Out: 1725 [Urine:1475; Drains:100; Blood:150]  Physical Exam:  General: Alert and oriented. Abdomen: Soft, Nondistended. Incisions: Clean and dry. Urine: pink  Lab Results:  Recent Labs  11/11/13 1055 11/13/13 1114  HGB 15.3 13.6  HCT 45.2 40.3    Assessment/Plan: POD#0   1) Continue to monitor  2) DVT prophy, clears, amb, IS, pain control     LOS: 0 days   Marcie Bal. 11/13/2013, 2:21 PM

## 2013-11-13 NOTE — Care Management Note (Addendum)
    Page 1 of 1   11/14/2013     2:20:30 PM   CARE MANAGEMENT NOTE 11/14/2013  Patient:  Curtis Marquez, Curtis Marquez   Account Number:  [id number]  Date Initiated:  11/13/2013  Documentation initiated by:  Curtis Marquez  Subjective/Objective Assessment:   68 Y/O M ADMITTED W/PROSTATE CA.     Action/Plan:   FROM HOME.HAS PCP,PHARMACY.   Anticipated DC Date:  11/14/2013   Anticipated DC Plan:  [name]  CM consult      Choice offered to / List presented to:             Status of service:  Completed, signed off Medicare Important Message given?   (If response is "NO", the following Medicare IM given date fields will be blank) Date Medicare IM given:   Date Additional Medicare IM given:    Discharge Disposition:  HOME/SELF CARE  Per UR Regulation:  Reviewed for med. necessity/level of care/duration of stay  If discussed at [place] Curtis Marquez of Stay Meetings, dates discussed:    Comments:  11/14/13 Curtis Nagele RN,BSN NCM [phone] D/C [place].  11/13/13 Curtis Rumer RN,BSN NCM [phone] S/P LAP RAD PROSTATECTOMY.NO ANTICIPATED D/C NEEDS.

## 2013-11-13 NOTE — Preoperative (Signed)
Beta Blockers   Reason not to administer Beta Blockers:Not Applicable 

## 2013-11-13 NOTE — Anesthesia Postprocedure Evaluation (Signed)
  Anesthesia Post-op Note  Patient: Curtis Marquez  Procedure(s) Performed: Procedure(s) (LRB): ROBOTIC ASSISTED LAPAROSCOPIC RADICAL PROSTATECTOMY LEVEL 2 (N/A) LYMPHADENECTOMY "PELVIC LYMPH NODE DISSECTION" (Bilateral)  Patient Location: PACU  Anesthesia Type: General  Level of Consciousness: awake and alert   Airway and Oxygen Therapy: Patient Spontanous Breathing  Post-op Pain: mild  Post-op Assessment: Post-op Vital signs reviewed, Patient's Cardiovascular Status Stable, Respiratory Function Stable, Patent Airway and No signs of Nausea or vomiting  Last Vitals:  Filed Vitals:   11/13/13 2112  BP: 171/95  Pulse: 104  Temp: 38.3 C  Resp: 18    Post-op Vital Signs: stable   Complications: No apparent anesthesia complications

## 2013-11-13 NOTE — Progress Notes (Signed)
The vitals were: 101 F, HR 104,RR 18,B/P 171/95 and 98% RA  oxygen sats. The PCP on call was notified.

## 2013-11-13 NOTE — Transfer of Care (Signed)
Immediate Anesthesia Transfer of Care Note  Patient: Gillian Halpert  Procedure(s) Performed: Procedure(s): ROBOTIC ASSISTED LAPAROSCOPIC RADICAL PROSTATECTOMY LEVEL 2 (N/A) LYMPHADENECTOMY "PELVIC LYMPH NODE DISSECTION" (Bilateral)  Patient Location: PACU  Anesthesia Type:General  Level of Consciousness: awake, alert  and oriented  Airway & Oxygen Therapy: Patient Spontanous Breathing and Patient connected to face mask oxygen  Post-op Assessment: Report given to PACU RN, Post -op Vital signs reviewed and stable and Patient moving all extremities X 4  Post vital signs: Reviewed and stable  Complications: No apparent anesthesia complications

## 2013-11-14 ENCOUNTER — Encounter (HOSPITAL_COMMUNITY): Payer: Self-pay | Admitting: Urology

## 2013-11-14 ENCOUNTER — Encounter (HOSPITAL_COMMUNITY): Payer: Self-pay

## 2013-11-14 ENCOUNTER — Ambulatory Visit (HOSPITAL_COMMUNITY): Payer: Self-pay

## 2013-11-14 LAB — URINE CULTURE
COLONY COUNT: NO GROWTH
CULTURE: NO GROWTH

## 2013-11-14 LAB — HEMOGLOBIN AND HEMATOCRIT, BLOOD
HCT: 40.8 % (ref 39.0–52.0)
Hemoglobin: 13.4 g/dL (ref 13.0–17.0)

## 2013-11-14 MED ORDER — BISACODYL 10 MG RE SUPP
10.0000 mg | Freq: Once | RECTAL | Status: AC
  Administered 2013-11-14: 10 mg via RECTAL
  Filled 2013-11-14: qty 1

## 2013-11-14 MED ORDER — HYDROCODONE-ACETAMINOPHEN 5-325 MG PO TABS: 1.0000 | ORAL_TABLET | Freq: Four times a day (QID) | ORAL | Status: DC | PRN

## 2013-11-14 NOTE — Discharge Summary (Signed)
  Date of admission: 11/13/2013  Date of discharge: 11/14/2013  Admission diagnosis: Prostate Cancer  Discharge diagnosis: Prostate Cancer  History and Physical: For full details, please see admission history and physical. Briefly, Curtis Marquez is a 68 y.o. gentleman with localized prostate cancer.  After discussing management/treatment options, he elected to proceed with surgical treatment.  Hospital Course: Curtis Marquez was taken to the operating room on 11/13/2013 and underwent a robotic assisted laparoscopic radical prostatectomy. He tolerated this procedure well and without complications. Postoperatively, he was able to be transferred to a regular hospital room following recovery from anesthesia.  He was able to begin ambulating the night of surgery. He remained hemodynamically stable overnight.  He had excellent urine output with appropriately minimal output from his pelvic drain and his pelvic drain was removed on POD #1.  He was transitioned to oral pain medication, tolerated a clear liquid diet, and had met all discharge criteria and was able to be discharged home later on POD#1.  Laboratory values:  Recent Labs  11/13/13 1114 11/14/13 0502  HGB 13.6 13.4  HCT 40.3 40.8    Disposition: Home  Discharge instruction: He was instructed to be ambulatory but to refrain from heavy lifting, strenuous activity, or driving. He was instructed on urethral catheter care.  Discharge medications:     Medication List    STOP taking these medications       ibuprofen 200 MG tablet  Commonly known as:  ADVIL,MOTRIN      TAKE these medications       ciprofloxacin 500 MG tablet  Commonly known as:  CIPRO  Take 1 tablet (500 mg total) by mouth 2 (two) times daily. Start day prior to office visit for foley removal     HYDROcodone-acetaminophen 5-325 MG per tablet  Commonly known as:  NORCO  Take 1-2 tablets by mouth every 6 (six) hours as needed.     olmesartan-hydrochlorothiazide 40-25 MG  per tablet  Commonly known as:  BENICAR HCT  Take 1 tablet by mouth every morning.     omeprazole 20 MG capsule  Commonly known as:  PRILOSEC  Take 20 mg by mouth daily.        Followup: He will followup in 1 week for catheter removal and to discuss his surgical pathology results.

## 2013-11-14 NOTE — Progress Notes (Signed)
Patient ID: Curtis Marquez, male   DOB: 04-05-1946, 68 y.o.   MRN: [id number]  1 Day Post-Op Subjective: The patient is doing well.  No nausea or vomiting. Pain is adequately controlled.  Objective: Vital signs in last 24 hours: Temp:  [97.6 F (36.4 C)-101 F (38.3 C)] 99.3 F (37.4 C) (01/23 0555) Pulse Rate:  [75-115] 98 (01/23 0555) Resp:  [15-20] 18 (01/23 0555) BP: (145-191)/(80-109) 155/93 mmHg (01/23 0555) SpO2:  [97 %-100 %] 100 % (01/23 0555) Weight:  [82.555 kg (182 lb)-88.633 kg (195 lb 6.4 oz)] 88.633 kg (195 lb 6.4 oz) (01/22 1145)  Intake/Output from previous day: 01/22 0701 - 01/23 0700 In: 6065 [P.O.:960; I.V.:4305; IV Piggyback:800] Out: 1438 [Urine:3800; Drains:235; Blood:150] Intake/Output this shift:    Physical Exam:  General: Alert and oriented. CV: RRR Lungs: Clear bilaterally. GI: Soft, Nondistended. Incisions: Dressings intact. Urine: Clear Extremities: Nontender, no erythema, no edema.  Lab Results:  Recent Labs  11/11/13 1055 11/13/13 1114 11/14/13 0502  HGB 15.3 13.6 13.4  HCT 45.2 40.3 40.8      Assessment/Plan: POD# 1 s/p robotic prostatectomy.  1) SL IVF 2) Ambulate, Incentive spirometry 3) Transition to oral pain medication 4) Dulcolax suppository 5) D/C pelvic drain 6) Plan for likely discharge later today   Curtis Marquez. MD   LOS: 1 day   Curtis Marquez,LES 11/14/2013, 7:50 AM

## 2015-03-31 ENCOUNTER — Ambulatory Visit: Payer: Self-pay | Admitting: Radiation Oncology

## 2015-05-04 ENCOUNTER — Telehealth: Payer: Self-pay | Admitting: Medical Oncology

## 2015-05-04 NOTE — Telephone Encounter (Signed)
Oncology Nurse Navigator Documentation  Oncology Nurse Navigator Flowsheets 05/04/2015  Referral date to RadOnc/MedOnc 04/29/2015  Navigator Encounter Type Introductory phone call   I called pt to introduce myself as the Prostate Nurse Navigator and the Coordinator of the Prostate [place].  1. I confirmed with the patient he is aware of his referral to the clinic July 26 arriving at 12:15 pm.   2. I discussed the format of the clinic and the physicians he will be seeing that day.  3. I discussed where the clinic is located and how to contact me.  4. I confirmed his address and informed him I would be mailing a packet of information and forms to be completed. I asked him to bring them with him the day of his appointment.   He voiced understanding of the above. I asked him to call me if he has any questions or concerns regarding his appointments or the forms he needs to complete.   Cira Rue, RN, BSN, CRNI Prostate Nurse [place] Office [phone] Fax [phone]

## 2015-05-13 ENCOUNTER — Encounter: Payer: Self-pay | Admitting: Radiation Oncology

## 2015-05-13 NOTE — Progress Notes (Signed)
GU Location of Tumor / Histology: Adenocarcinoma of the Prostate  If Prostate Cancer, Gleason Score is ( 4+3 ) and PSA is (12.3) in 2015  Adonis Brook presented for prostate cancer surveillance approximately 15 months out from his radical prostatectomy  with "persistently elevated PSA, although below the threshold definition of cancer recurrence". However, it has been stable    Biopsies of Prostate Biopsy  11/13/13 Radical Resection of the Prostate  1. Prostate, radical resection    - PROSTACTIC ADENOCARCINOMA, GLEASON'S SCORE 4+3=7, INVOLVING BOTH LOBES    - NO EVIDENCE OF ANGIOLYMPHATIC INVASION, EXTRAPROSTATIC EXTENSION OR SEMINAL VESICAL INVOLVEMENT IDENTIFIED 2. Lymph nodes,regional resection    - EIGHT LYMPH NODES, NEGATIVE FOR METASTATIC CARCINOMA (0/8) 3.  Lymph nodes, regional resection    - FIVE LYMPH NODES, NEGATIVE FOR METASTATIC CARCINOMA (0/5)  Past/Anticipated interventions by urology, if any: Dr. Raynelle Bring- Prostate [facility]  Past/Anticipated interventions by medical oncology, if any: No  Weight changes, if any: None  Bowel/Bladder complaints, if any:  1 pad per day for protective purposes. as of April 2015.  Nocturia x 1.  /denies any urgency, straining, nor forced stream  Nausea/Vomiting, if any: None  Pain issues, if any: None  SAFETY ISSUES:  Prior radiation? No  Pacemaker/ICD? No  Possible current pregnancy? N/A  Is the patient on methotrexate? No  Current Complaints / other details:  Partial Nocturnal erections with difficult time obtaining an erection on demand.   SHIM Scores 2

## 2015-05-17 ENCOUNTER — Telehealth: Payer: Self-pay | Admitting: Medical Oncology

## 2015-05-17 NOTE — Telephone Encounter (Signed)
Oncology Nurse Navigator Documentation  Oncology Nurse Navigator Flowsheets 05/04/2015 05/17/2015  Referral date to RadOnc/MedOnc 04/29/2015 -  Navigator Encounter Type Introductory phone call Telephone- Called Curtis Marquez and spoke with his wife to confirm appointment 05/18/15 in the Prostate [place] arriving at 12:15pm. I reminded them to bring completed medical forms and to have lunch. She voiced understanding and is aware of our location.   Time Spent with Patient 15 -

## 2015-05-18 ENCOUNTER — Ambulatory Visit
Admission: RE | Admit: 2015-05-18 | Discharge: 2015-05-18 | Disposition: A | Discharge status: Home / Self Care | Payer: Medicare Other | Source: Ambulatory Visit | Attending: Radiation Oncology | Admitting: Radiation Oncology

## 2015-05-18 ENCOUNTER — Encounter: Payer: Self-pay | Admitting: General Practice

## 2015-05-18 ENCOUNTER — Ambulatory Visit (HOSPITAL_BASED_OUTPATIENT_CLINIC_OR_DEPARTMENT_OTHER): Payer: Medicare Other | Admitting: Oncology

## 2015-05-18 ENCOUNTER — Encounter: Payer: Self-pay | Admitting: Radiation Oncology

## 2015-05-18 ENCOUNTER — Encounter: Payer: Self-pay | Admitting: Medical Oncology

## 2015-05-18 VITALS — BP 168/103 | HR 116 | Temp 97.5°F | Ht 68.0 in

## 2015-05-18 DIAGNOSIS — C61 Malignant neoplasm of prostate: Secondary | ICD-10-CM

## 2015-05-18 HISTORY — DX: Pure hypercholesterolemia, unspecified: E78.00

## 2015-05-18 NOTE — Progress Notes (Signed)
Spiritual Care Note  Met with Curtis Marquez and his wife in [facility] to introduce [facility] team/resources, providing pastoral presence, reflective listening, and print materials re resources/contact info.  Per pt, he gets bored/restless and likes to keep busy; introduced support center programming as additional resource for meaning-making activity. He verbalized little distress.  Pt's wife shared extensively about other situations of cancer, caregiving, and loss that she has experienced; provided emotional support, normalization of feelings, and encouragement to use Support Team and caregiver workshop for additional support.  She reports that hospice has been a very helpful resource for her coping, as well.    Pt/family aware of ongoing chaplain availability for support, but please also page as needs arise.  Thank you.  [place], [place] Pager [phone] Voicemail  [phone]

## 2015-05-18 NOTE — Consult Note (Addendum)
Chief Complaint  Prostate Cancer   Reason For Visit  Location of consult: Prostate Cancer Multidisciplinary Clinic at the [facility]   History of Present Illness  Curtis Marquez is a 69 year old who was noted to have an elevated PSA of 12.3 with left apical induration of the prostate.  He ultimately underwent a biopsy and was diagnosed with Gleason 8 prostate cancer. His staging evaluation including a CT scan of the pelvis that was negative for lymphadenopathy and a bone scan that demonstrated an area of focal uptake near the right scapula.  Plain films demonstrated a possible lytic area at the right 5th rib and it was felt that this could have explained his bone scan findings.  No blastic lesions were identified.  He ultimately elected to undergo primary surgical therapy and is s/p a UNS RAL radical prostatectomy and BPLND on 11/13/13.  His PSA was persistently elevated at 0.08 postoperatively but remained at this level until April 2016 when his PSA increased to 0.19. His PSA further increased to 0.24 when checked last week.  Diagnosis: pT2c N0 Mx, Gleason 4+3=7 adenocarcinoma with negative surgical margins Pretreatment PSA: 12.3 Pretreatment SHIM: 17 (He was able to achieve adequate erections about 50% of the time preoperatively.)  Interval history:  Curtis Marquez follows up today to discuss options for management of his biochemically recurrent prostate cancer.  He remains asymptomatic.  He does continue to use one pack per day for safety protective purposes.  He continues to have erectile dysfunction obtained partial erections.  PDE 5 inhibitor has not been helpful.     Past Medical History  1. History of gastroesophageal reflux (GERD) (Z87.19)  2. History of hypercholesterolemia (Z86.39)  3. History of hypertension (Z86.79)  Surgical History  1. History of Brain Surgery  2. History of Laparoscopy With Bilateral Total Pelvic Lymphadenectomy  3. History of Prostatect Retropubic  Radical W/ Nerve Sparing Laparoscopic  Current Meds  1. Benicar HCT 40-25 MG Oral Tablet;  Therapy: [id number] to Recorded  2. Cialis 20 MG Oral Tablet; TAKE 1 TABLET As Directed;  Therapy: 27Oct2015 to (Evaluate:24Apr2016)  Requested for: 27Oct2015; Last  Rx:27Oct2015 Ordered  3. Claritin-D 12 Hour TB12;  Therapy: (Recorded:27Apr2016) to Recorded  4. CloNIDine HCl TABS;  Therapy: (HGDJMEQA:[id number]) to Recorded  5. Fluticasone Propionate 50 MCG/ACT Nasal Suspension;  Therapy: 16Apr2015 to Recorded  6. Hydrochlorothiazide 25 MG Oral Tablet;  Therapy: (Recorded:02Jan2015) to Recorded  7. Nystatin 100000 UNIT/GM External Ointment;  Therapy: [id number] to Recorded  8. Omeprazole 20 MG Oral Capsule Delayed Release;  Therapy: (Recorded:02Jan2015) to Recorded  Allergies  1. No Known Drug Allergies  Family History  1. No pertinent family history : Mother  Social History   Denied: History of Alcohol use   Exercise: Walking   Former smoker [phone])  Physical Exam Constitutional: Well nourished and well developed . No acute distress.    Results/Data  We have reviewed his medical records, PSA results, pathology reports, and imaging reports today in conference.  Furthermore, I have independently reviewed his pathology slides and imaging studies.  Specifically, we have reviewed his initial imaging studies from January 2015.  Although there was an area on the right fifth rib that raise concern, it was clear that this was a lytic area and unlikely to represent solitary bone metastasis.  Considering his PSA decreased following surgery, it was also felt that this would be unlikely.  Nonetheless, this area remains of mild suspicion.     Assessment  1.  Prostate cancer (C61)  Discussion/Summary  1.  Biochemically recurrent prostate cancer: I explained to Mr. Thau and his wife that it appears confirm that he does have biochemically recurrent prostate cancer.  Considering this finding, we  discussed the option of additional treatment.  Specifically, he understands the option of salvage radiation therapy which would be best performed while his PSA is still low.  That being said, considering his various disease parameters from his pathology report which suggested an organ confined cancer and considering the fairly quick time to development of biochemical recurrence, and understands the likelihood of salvage cure with radiation is probably low and may be less than 10%.  He did meet with Dr. Valere Dross earlier today and has a clear understanding about the potential risks involved with radiation therapy and understands that he needs to balance his potential risks against the success rates as discussed.  Currently, he plans to consider his options.  Tentatively, he will the very least is scheduled for an appointment with me in 3 months for his next PSA.  If he does elect to proceed with radiation therapy, he would likely benefit him to further evaluate his right rib lesion with a repeat bone scan followed by possible CT imaging if the lesion continues to raise concern on bone scan imaging.  He then may benefit from biopsy to absolutely determine whether he has systemic metastatic disease.  If he does not elect to proceed with salvage curative radiation therapy, it is likely of no clinical significance to further evaluate his rib lesion as further therapy would be systemic therapy without curative intent regardless.  All questions were answered to his stated satisfaction.  He feels well informed and will notify me how he would like to proceed.  Cc: Dr. Zola Button Dr. Arloa Koh Dr. Monico Blitz   A total of 25 minutes were spent in the overall care of the patient today with 25 minutes in direct face to face consultation.    Signatures Electronically signed by : Raynelle Bring, M.D.; May 18 2015  4:07PM EST Electronically signed by : Raynelle Bring, M.D.; May 18 2015  4:08PM EST

## 2015-05-18 NOTE — Consult Note (Signed)
Reason for Referral: prostate cancer.  HPI: 69 year old gentleman of [place] where he lived the majority of his life. He is a gentleman in reasonably good health with history of hyperlipidemia and hypotension. He was found to have an elevated PSA up to 12.3 in December 2014. He underwent a biopsy that showed prostate cancer at that time with a Gleason score 4+3 = 7. His staging workup including a bone scan and a CT scan is essentially unremarkable. There was one area of sclerotic lesion at the scapula that was rather subtle and could not be confirmed on plain film imaging. At that time, he underwent a robotic-assisted radical prostatectomy and bilateral lymphadenectomy on 11/13/2013. This was under the care of Dr. Alinda Money and have been on follow-up since that time. His pathology at that time revealed the presence of T2cN0 disease. The final pathology showed a Gleason score 4+3 = 7 involving both lobes without any evidence of angiolymphatic invasion.  His follow-up PSAs over. Time showed slight increase. PSA nadir was 0.08 that increased to 0.19 in April 2016 and up to 0.24 in July 2016. Patient is asymptomatic at this time and was seen as a part of the prostate cancer multidisciplinary clinic.  Clinically he does not report any headaches, blurry vision, syncope or seizures. He does not report any fevers or chills or sweats. Does not report any cough, hemoptysis or hematemesis. Does not report any nausea, vomiting or abdominal pain. He does not report any frequency urgency or hesitancy. He is not report any skeletal complaints of arthralgias or myalgias. Remaining review of systems unremarkable.  Past Medical History  Diagnosis Date  . Hypertension   . GERD (gastroesophageal reflux disease)   . Prostate cancer 11/13/13  . Hypercholesteremia   :  Past Surgical History  Procedure Laterality Date  . Brain tumor excision  1994  . Robot assisted laparoscopic radical prostatectomy N/A  11/13/2013    Procedure: ROBOTIC ASSISTED LAPAROSCOPIC RADICAL PROSTATECTOMY LEVEL 2;  Surgeon: Dutch Gray, MD;  Location: [facility];  Service: Urology;  Laterality: N/A;  . Lymphadenectomy Bilateral 11/13/2013    Procedure: LYMPHADENECTOMY "PELVIC LYMPH NODE DISSECTION";  Surgeon: Dutch Gray, MD;  Location: [facility];  Service: Urology;  Laterality: Bilateral;  :   Current outpatient prescriptions:  .  Loratadine-Pseudoephedrine (CLARITIN-D 12 HOUR PO), Take by mouth., Disp: , Rfl:  .  losartan (COZAAR) 25 MG tablet, Take 25 mg by mouth daily., Disp: , Rfl:  .  ranitidine (ZANTAC) 150 MG tablet, Take 150 mg by mouth 2 (two) times daily., Disp: , Rfl: :  No Known Allergies:  Family History  Problem Relation Age of Onset  . Other Mother   :  History   Social History  . Marital Status: Married    Spouse Name: N/A  . Number of Children: N/A  . Years of Education: N/A   Occupational History  . Not on file.   Social History Main Topics  . Smoking status: Former Research scientist (life sciences)  . Smokeless tobacco: Not on file  . Alcohol Use: No  . Drug Use: No  . Sexual Activity: Not on file   Other Topics Concern  . Not on file   Social History Narrative  :  Pertinent items are noted in HPI.  Exam: ECOG 0 General appearance: alert and cooperative Nose: Nares normal. Septum midline. Mucosa normal. No drainage or sinus tenderness. Throat: lips, mucosa, and tongue normal; teeth and gums normal Neck: no adenopathy Back: negative Resp: clear to auscultation  bilaterally Chest wall: no tenderness Cardio: regular rate and rhythm, S1, S2 normal, no murmur, click, rub or gallop GI: soft, non-tender; bowel sounds normal; no masses,  no organomegaly Extremities: extremities normal, atraumatic, no cyanosis or edema Pulses: 2+ and symmetric Skin: Skin color, texture, turgor normal. No rashes or lesions Lymph nodes: Cervical, supraclavicular, and axillary nodes normal.  CBC    Component Value Date/Time    WBC 8.2 11/11/2013 1055   RBC 5.24 11/11/2013 1055   HGB 13.4 11/14/2013 0502   HCT 40.8 11/14/2013 0502   PLT 298 11/11/2013 1055   MCV 86.3 11/11/2013 1055   MCH 29.2 11/11/2013 1055   MCHC 33.8 11/11/2013 1055   RDW 13.8 11/11/2013 1055      Assessment and Plan:   69 year old gentleman with the following issues:  1. Prostate cancer diagnosed in January 2015 with a PSA of 12.3, Gleason score 4+3 = 7 and a pathological staging of T2cN0 after a radical prostatectomy. His PSA nadir was 0.08 and over a period of time it has risen up to 0.24 in July 2016. The case was reviewed today and the prostate cancer multidisciplinary clinic. His pathology was discussed with the reviewing pathologist. Imaging studies were also reviewed by radiology.  Options of treatment were discussed with the patient today. These options would include continued observation and surveillance and following his PSA. His PSA continues to rise rapidly than he might require systemic therapy with androgen depravation.  Alternatively, salvage radiation therapy could be offered. Given the slow rise in his PSA and might offer him salvage therapy although the therapeutic effect is marginal. Complications associated with radiation therapy was discussed by Dr. Valere Dross today.  I discussed with him the role of adding in vision depravation to salvage radiation therapy. This is a data that is available to [place] based on the RTOG 9601 study that showed significant improvement in overall survival after adding 2 years of antigen deprivation to salvage radiation therapy.  Complications associated with this therapy were discussed in detail and all his questions were answered. Complications include hot flashes, breast tenderness, weight gain, muscle mass loss among others.  If he is to consider salvage radiation therapy, he'll probably require restaging workup to ensure he does not have metastatic disease.  2. Questionable lytic lesion in the  scapula: I doubt this is of any significance but certainly worth monitoring. I see no signs to suggest multiple myeloma with a normal hemoglobin, calcium and kidney function. Obtaining a serum protein electrophoresis would be low yield at this time.

## 2015-05-18 NOTE — Progress Notes (Signed)
                               Care Plan Summary  Name: Curtis Marquez DOB: 11/25/1945   Your Medical Team:   Urologist -  Dr. Raynelle Bring, [facility]  Radiation Oncologist - Dr. Arloa Koh, [facility]   Medical Oncologist - Dr. Zola Button, [facility]  Recommendations: 1) Continue to follow PSA and see Dr. Alinda Money  2) Radiation Therapy   * These recommendations are based on information available as of today's consult.      Recommendations may change depending on the results of further tests or exams.    Next Steps: 1) Follow up with Dr. Alinda Money with PSA 2) If you  decide to proceed with radiation before visit with Dr. Alinda Money, please call Cira Rue, RN or Dr. Valere Dross  When appointments need to be scheduled, you will be contacted by [facility] and/or [facility].  Questions?  Please do not hesitate to call Cira Rue, RN, BSN, CRNI at [phone] any questions or concerns.  Shirlean Mylar is your Oncology Nurse Navigator and is available to assist you while you're receiving your medical care at [facility].

## 2015-05-18 NOTE — Progress Notes (Signed)
[facility] NEW PATIENT EVALUATION  Name: Baltasar Visalli MRN: [id number]  Date:   05/18/2015           DOB: 01-10-1946  Status: outpatient   CC: Minta Balsam, MD  Raynelle Bring, MD    REFERRING PHYSICIAN: Raynelle Bring, MD   DIAGNOSIS: PSA recurrent carcinoma the prostate   HISTORY OF PRESENT ILLNESS:  Jaking Brasington is a 69 y.o. male who is seen today through the courtesy of Dr. Alinda Money at the prostate multidisciplinary clinic for discussion of possible salvage radiation therapy in the management of his PSA recurrent carcinoma the prostate.  He presented in 2014 with an elevated PSA of 11.5 and then a repeat PSA following antibiotic support 12.3.  He was felt to have palpable disease along the left apex.  Biopsies outside were diagnostic for Gleason 8 (4+4) adenocarcinoma on 09/23/2013.  1216 cores were involved.  His staging workup at that time did show uptake along the left scapula or right rib with plain films showing a subtle lytic lesion along the posterior lateral right fifth rib adjacent to the scapula.  Being isolated, this was not felt to represent metastatic disease.  He proceeded with a robotic prostatectomy on 11/13/2013.  He was found have Gleason 7 (4+3) involving both lobes.  25% of the prostate tissue was involved by tumor.  Margins were negative and there was no extra prostatic extension or involvement of the seminal vesicles.  13 lymph nodes were free of metastatic disease.  He did well postoperatively and his first PSA was 0.08 on 01/08/2014.  A follow-up PSA was 0.08 on 04/01/2014 and also on 07/17/2014.  His PSA rose to 0.19 on 02/10/2015 and 0.24 this month.  He is doing well from a GU standpoint, and he is dry.  No GI difficulties.  He lives in [place].  PREVIOUS RADIATION THERAPY: No   PAST MEDICAL HISTORY:  has a past medical history of Hypertension; GERD (gastroesophageal reflux disease); Prostate cancer (11/13/13); and Hypercholesteremia.     PAST  SURGICAL HISTORY:  Past Surgical History  Procedure Laterality Date  . Brain tumor excision  1994  . Robot assisted laparoscopic radical prostatectomy N/A 11/13/2013    Procedure: ROBOTIC ASSISTED LAPAROSCOPIC RADICAL PROSTATECTOMY LEVEL 2;  Surgeon: Dutch Gray, MD;  Location: [facility];  Service: Urology;  Laterality: N/A;  . Lymphadenectomy Bilateral 11/13/2013    Procedure: LYMPHADENECTOMY "PELVIC LYMPH NODE DISSECTION";  Surgeon: Dutch Gray, MD;  Location: [facility];  Service: Urology;  Laterality: Bilateral;     FAMILY HISTORY: family history includes Other in his mother.  His father died at age 84, unknown cause.  His mother is alive and well at age 72.  No family history of prostate cancer.     SOCIAL HISTORY:  reports that he has quit smoking. He does not have any smokeless tobacco history on file. He reports that he does not drink alcohol or use illicit drugs.  Married, one child.  He worked in Product/process development scientist.     ALLERGIES: Review of patient's allergies indicates no known allergies.   MEDICATIONS:  Current Outpatient Prescriptions  Medication Sig Dispense Refill  . Loratadine-Pseudoephedrine (CLARITIN-D 12 HOUR PO) Take by mouth.    . losartan (COZAAR) 25 MG tablet Take 25 mg by mouth daily.    . ranitidine (ZANTAC) 150 MG tablet Take 150 mg by mouth 2 (two) times daily.     No current facility-administered medications for this encounter.     REVIEW OF  SYSTEMS:  Pertinent items are noted in HPI.    PHYSICAL EXAM:  height is 5\' 8"  (1.727 m). His temperature is 97.5 F (36.4 C). His blood pressure is 168/103 and his pulse is 116.   Alert and oriented 69 year old African-American male appearing younger than his stated age.  Rectal examination not performed today.     LABORATORY DATA:  Lab Results  Component Value Date   WBC 8.2 11/11/2013   HGB 13.4 11/14/2013   HCT 40.8 11/14/2013   MCV 86.3 11/11/2013   PLT 298 11/11/2013   Lab Results  Component Value Date    NA 143 11/11/2013   K 3.4* 11/11/2013   CL 103 11/11/2013   CO2 27 11/11/2013   No results found for: ALT, AST, GGT, ALKPHOS, BILITOT    PSA 0.24 from earlier this month.  IMPRESSION: PSA recurrent carcinoma the prostate.  I explained to Mr. Roughton  That clinical predictors for local recurrence alone, and therefore possible cure with radiation therapy include a positive surgical margin, extracapsular extension, seminal vesicle involvement, a disease-free interval and initial PSA of less than 10 and Gleason score 6 or 7.  He does not have any these clinical predictors.  Therefore, he is likely to have occult nodal or distant metastases.  I explained to him that the likelihood for cure with localized radiation therapy is probably no higher than 10-15%.  We discussed the potential acute and late toxicities of radiation therapy.  If he wants radiation therapy he would benefit from more technically advanced treatment here in [facility] rather than [facility] where there is no image guidance to safely deliver the recommended target dose of at least 6500 cGy.  There would not be image guidance to assess bladder filling which would also be recommended to decrease urinary toxicity.  He will think things over and decide on whether not he would like to consider possible salvage radiation therapy.  If he want to consider salvage radiation therapy then we should restage him with a bone scan and perhaps plain films of his right lateral fifth rib.   I spent 30 minutes face to face with the patient and more than 50% of that time was spent in counseling and/or coordination of care.

## 2015-05-18 NOTE — Progress Notes (Signed)
Please see consult note.  

## 2015-06-01 ENCOUNTER — Encounter: Payer: Self-pay | Admitting: Medical Oncology

## 2015-09-07 ENCOUNTER — Telehealth: Payer: Self-pay | Admitting: Medical Oncology

## 2015-09-07 ENCOUNTER — Other Ambulatory Visit (HOSPITAL_COMMUNITY): Payer: Self-pay | Admitting: Urology

## 2015-09-07 DIAGNOSIS — C61 Malignant neoplasm of prostate: Secondary | ICD-10-CM

## 2015-09-07 NOTE — Progress Notes (Signed)
Oncology Nurse Navigator Documentation  Oncology Nurse Navigator Flowsheets 05/18/2015 06/01/2015 09/07/2015  Referral date to RadOnc/MedOnc 05/18/2015 - -  Navigator Encounter Type Clinic/MDC Telephone Telephone-Mr. Grady working but spoke with his wife. He was seen yesterday by Dr. Lynne Logan PA. He is scheduled for a bone scan later this month. He has decided to do the radiation treatments. I will continue to follow.  Patient Visit Type - Follow-up Follow-up  Time Spent with Patient [id number]

## 2015-09-07 NOTE — Telephone Encounter (Signed)
I called Mr. Timbers but unable to reach him due to work. Spoke with his wife and she states they saw Dr. Lynne Logan PA yesterday. He is scheduled for a bone scan and then he will proceed with radiation. I will continue to follow. I asked them to call me with any questions or concerns. She voiced understanding.

## 2015-09-15 ENCOUNTER — Ambulatory Visit (HOSPITAL_COMMUNITY)
Admission: RE | Admit: 2015-09-15 | Discharge: 2015-09-15 | Disposition: A | Discharge status: Home / Self Care | Payer: Medicare Other | Source: Ambulatory Visit | Attending: Urology | Admitting: Urology

## 2015-09-15 ENCOUNTER — Encounter (HOSPITAL_COMMUNITY)
Admission: RE | Admit: 2015-09-15 | Discharge: 2015-09-15 | Disposition: A | Discharge status: Home / Self Care | Payer: Medicare Other | Source: Ambulatory Visit | Attending: Urology | Admitting: Urology

## 2015-09-15 DIAGNOSIS — R937 Abnormal findings on diagnostic imaging of other parts of musculoskeletal system: Secondary | ICD-10-CM | POA: Insufficient documentation

## 2015-09-15 DIAGNOSIS — C61 Malignant neoplasm of prostate: Secondary | ICD-10-CM | POA: Diagnosis not present

## 2015-09-15 MED ORDER — TECHNETIUM TC 99M MEDRONATE IV KIT
26.8000 | PACK | Freq: Once | INTRAVENOUS | Status: AC | PRN
  Administered 2015-09-15: 26.8 via INTRAVENOUS

## 2015-10-26 DIAGNOSIS — Z79899 Other long term (current) drug therapy: Secondary | ICD-10-CM | POA: Diagnosis not present

## 2015-10-26 DIAGNOSIS — I10 Essential (primary) hypertension: Secondary | ICD-10-CM | POA: Diagnosis not present

## 2015-10-26 DIAGNOSIS — Z87891 Personal history of nicotine dependence: Secondary | ICD-10-CM | POA: Diagnosis not present

## 2015-10-26 DIAGNOSIS — Z9079 Acquired absence of other genital organ(s): Secondary | ICD-10-CM | POA: Diagnosis not present

## 2015-10-26 DIAGNOSIS — K219 Gastro-esophageal reflux disease without esophagitis: Secondary | ICD-10-CM | POA: Diagnosis not present

## 2015-10-26 DIAGNOSIS — E7800 Pure hypercholesterolemia, unspecified: Secondary | ICD-10-CM | POA: Diagnosis not present

## 2015-10-26 DIAGNOSIS — C61 Malignant neoplasm of prostate: Secondary | ICD-10-CM | POA: Diagnosis not present

## 2015-10-26 DIAGNOSIS — R9721 Rising PSA following treatment for malignant neoplasm of prostate: Secondary | ICD-10-CM | POA: Diagnosis not present

## 2015-11-08 DIAGNOSIS — C61 Malignant neoplasm of prostate: Secondary | ICD-10-CM | POA: Diagnosis not present

## 2015-11-24 DIAGNOSIS — Z510 Encounter for antineoplastic radiation therapy: Secondary | ICD-10-CM | POA: Diagnosis not present

## 2015-11-24 DIAGNOSIS — C61 Malignant neoplasm of prostate: Secondary | ICD-10-CM | POA: Diagnosis not present

## 2015-12-08 DIAGNOSIS — C61 Malignant neoplasm of prostate: Secondary | ICD-10-CM | POA: Diagnosis not present

## 2015-12-09 DIAGNOSIS — C61 Malignant neoplasm of prostate: Secondary | ICD-10-CM | POA: Diagnosis not present

## 2015-12-10 DIAGNOSIS — C61 Malignant neoplasm of prostate: Secondary | ICD-10-CM | POA: Diagnosis not present

## 2015-12-13 DIAGNOSIS — C61 Malignant neoplasm of prostate: Secondary | ICD-10-CM | POA: Diagnosis not present

## 2015-12-14 DIAGNOSIS — C61 Malignant neoplasm of prostate: Secondary | ICD-10-CM | POA: Diagnosis not present

## 2015-12-15 DIAGNOSIS — C61 Malignant neoplasm of prostate: Secondary | ICD-10-CM | POA: Diagnosis not present

## 2015-12-16 DIAGNOSIS — C61 Malignant neoplasm of prostate: Secondary | ICD-10-CM | POA: Diagnosis not present

## 2015-12-17 DIAGNOSIS — C61 Malignant neoplasm of prostate: Secondary | ICD-10-CM | POA: Diagnosis not present

## 2015-12-20 DIAGNOSIS — C61 Malignant neoplasm of prostate: Secondary | ICD-10-CM | POA: Diagnosis not present

## 2015-12-21 DIAGNOSIS — C61 Malignant neoplasm of prostate: Secondary | ICD-10-CM | POA: Diagnosis not present

## 2015-12-22 DIAGNOSIS — Z510 Encounter for antineoplastic radiation therapy: Secondary | ICD-10-CM | POA: Diagnosis not present

## 2015-12-22 DIAGNOSIS — R300 Dysuria: Secondary | ICD-10-CM | POA: Diagnosis not present

## 2015-12-22 DIAGNOSIS — I10 Essential (primary) hypertension: Secondary | ICD-10-CM | POA: Diagnosis not present

## 2015-12-22 DIAGNOSIS — R3915 Urgency of urination: Secondary | ICD-10-CM | POA: Diagnosis not present

## 2015-12-22 DIAGNOSIS — C61 Malignant neoplasm of prostate: Secondary | ICD-10-CM | POA: Diagnosis not present

## 2015-12-22 DIAGNOSIS — R350 Frequency of micturition: Secondary | ICD-10-CM | POA: Diagnosis not present

## 2015-12-23 DIAGNOSIS — C61 Malignant neoplasm of prostate: Secondary | ICD-10-CM | POA: Diagnosis not present

## 2015-12-24 DIAGNOSIS — C61 Malignant neoplasm of prostate: Secondary | ICD-10-CM | POA: Diagnosis not present

## 2015-12-27 DIAGNOSIS — C61 Malignant neoplasm of prostate: Secondary | ICD-10-CM | POA: Diagnosis not present

## 2015-12-28 DIAGNOSIS — C61 Malignant neoplasm of prostate: Secondary | ICD-10-CM | POA: Diagnosis not present

## 2015-12-29 DIAGNOSIS — C61 Malignant neoplasm of prostate: Secondary | ICD-10-CM | POA: Diagnosis not present

## 2015-12-30 DIAGNOSIS — C61 Malignant neoplasm of prostate: Secondary | ICD-10-CM | POA: Diagnosis not present

## 2015-12-31 DIAGNOSIS — C61 Malignant neoplasm of prostate: Secondary | ICD-10-CM | POA: Diagnosis not present

## 2016-01-03 DIAGNOSIS — C61 Malignant neoplasm of prostate: Secondary | ICD-10-CM | POA: Diagnosis not present

## 2016-01-04 DIAGNOSIS — C61 Malignant neoplasm of prostate: Secondary | ICD-10-CM | POA: Diagnosis not present

## 2016-01-05 DIAGNOSIS — C61 Malignant neoplasm of prostate: Secondary | ICD-10-CM | POA: Diagnosis not present

## 2016-01-06 DIAGNOSIS — C61 Malignant neoplasm of prostate: Secondary | ICD-10-CM | POA: Diagnosis not present

## 2016-01-07 DIAGNOSIS — C61 Malignant neoplasm of prostate: Secondary | ICD-10-CM | POA: Diagnosis not present

## 2016-01-10 DIAGNOSIS — C61 Malignant neoplasm of prostate: Secondary | ICD-10-CM | POA: Diagnosis not present

## 2016-01-11 DIAGNOSIS — C61 Malignant neoplasm of prostate: Secondary | ICD-10-CM | POA: Diagnosis not present

## 2016-01-12 DIAGNOSIS — C61 Malignant neoplasm of prostate: Secondary | ICD-10-CM | POA: Diagnosis not present

## 2016-01-13 DIAGNOSIS — C61 Malignant neoplasm of prostate: Secondary | ICD-10-CM | POA: Diagnosis not present

## 2016-01-14 DIAGNOSIS — C61 Malignant neoplasm of prostate: Secondary | ICD-10-CM | POA: Diagnosis not present

## 2016-01-17 DIAGNOSIS — C61 Malignant neoplasm of prostate: Secondary | ICD-10-CM | POA: Diagnosis not present

## 2016-01-18 DIAGNOSIS — C61 Malignant neoplasm of prostate: Secondary | ICD-10-CM | POA: Diagnosis not present

## 2016-01-19 DIAGNOSIS — C61 Malignant neoplasm of prostate: Secondary | ICD-10-CM | POA: Diagnosis not present

## 2016-01-20 DIAGNOSIS — C61 Malignant neoplasm of prostate: Secondary | ICD-10-CM | POA: Diagnosis not present

## 2016-01-21 DIAGNOSIS — C61 Malignant neoplasm of prostate: Secondary | ICD-10-CM | POA: Diagnosis not present

## 2016-01-24 DIAGNOSIS — Z510 Encounter for antineoplastic radiation therapy: Secondary | ICD-10-CM | POA: Diagnosis not present

## 2016-01-24 DIAGNOSIS — C61 Malignant neoplasm of prostate: Secondary | ICD-10-CM | POA: Diagnosis not present

## 2016-01-25 DIAGNOSIS — C61 Malignant neoplasm of prostate: Secondary | ICD-10-CM | POA: Diagnosis not present

## 2016-01-26 DIAGNOSIS — I10 Essential (primary) hypertension: Secondary | ICD-10-CM | POA: Diagnosis not present

## 2016-01-26 DIAGNOSIS — C61 Malignant neoplasm of prostate: Secondary | ICD-10-CM | POA: Diagnosis not present

## 2016-01-26 DIAGNOSIS — K529 Noninfective gastroenteritis and colitis, unspecified: Secondary | ICD-10-CM | POA: Diagnosis not present

## 2016-02-09 ENCOUNTER — Emergency Department (HOSPITAL_COMMUNITY): Payer: PPO

## 2016-02-09 ENCOUNTER — Encounter (HOSPITAL_COMMUNITY): Payer: Self-pay | Admitting: Emergency Medicine

## 2016-02-09 ENCOUNTER — Emergency Department (HOSPITAL_COMMUNITY)
Admission: EM | Admit: 2016-02-09 | Discharge: 2016-02-09 | Disposition: A | Discharge status: Home / Self Care | Payer: PPO | Attending: Emergency Medicine | Admitting: Emergency Medicine

## 2016-02-09 ENCOUNTER — Inpatient Hospital Stay (HOSPITAL_COMMUNITY)
Admission: EM | Admit: 2016-02-09 | Discharge: 2016-02-12 | DRG: 305 | Disposition: A | Discharge status: Home / Self Care | Payer: PPO | Attending: Internal Medicine | Admitting: Internal Medicine

## 2016-02-09 DIAGNOSIS — R42 Dizziness and giddiness: Secondary | ICD-10-CM | POA: Diagnosis not present

## 2016-02-09 DIAGNOSIS — Z9079 Acquired absence of other genital organ(s): Secondary | ICD-10-CM | POA: Diagnosis not present

## 2016-02-09 DIAGNOSIS — K219 Gastro-esophageal reflux disease without esophagitis: Secondary | ICD-10-CM | POA: Diagnosis present

## 2016-02-09 DIAGNOSIS — C61 Malignant neoplasm of prostate: Secondary | ICD-10-CM

## 2016-02-09 DIAGNOSIS — R188 Other ascites: Secondary | ICD-10-CM | POA: Diagnosis not present

## 2016-02-09 DIAGNOSIS — Y998 Other external cause status: Secondary | ICD-10-CM | POA: Diagnosis not present

## 2016-02-09 DIAGNOSIS — S3991XA Unspecified injury of abdomen, initial encounter: Secondary | ICD-10-CM | POA: Diagnosis present

## 2016-02-09 DIAGNOSIS — Y92410 Unspecified street and highway as the place of occurrence of the external cause: Secondary | ICD-10-CM | POA: Diagnosis not present

## 2016-02-09 DIAGNOSIS — Z8546 Personal history of malignant neoplasm of prostate: Secondary | ICD-10-CM | POA: Insufficient documentation

## 2016-02-09 DIAGNOSIS — C7951 Secondary malignant neoplasm of bone: Secondary | ICD-10-CM | POA: Diagnosis not present

## 2016-02-09 DIAGNOSIS — I10 Essential (primary) hypertension: Secondary | ICD-10-CM | POA: Diagnosis not present

## 2016-02-09 DIAGNOSIS — R5383 Other fatigue: Secondary | ICD-10-CM | POA: Diagnosis not present

## 2016-02-09 DIAGNOSIS — R55 Syncope and collapse: Secondary | ICD-10-CM | POA: Insufficient documentation

## 2016-02-09 DIAGNOSIS — Z87891 Personal history of nicotine dependence: Secondary | ICD-10-CM | POA: Insufficient documentation

## 2016-02-09 DIAGNOSIS — Z79899 Other long term (current) drug therapy: Secondary | ICD-10-CM | POA: Insufficient documentation

## 2016-02-09 DIAGNOSIS — Z8639 Personal history of other endocrine, nutritional and metabolic disease: Secondary | ICD-10-CM | POA: Insufficient documentation

## 2016-02-09 DIAGNOSIS — E7800 Pure hypercholesterolemia, unspecified: Secondary | ICD-10-CM | POA: Diagnosis not present

## 2016-02-09 DIAGNOSIS — Z923 Personal history of irradiation: Secondary | ICD-10-CM

## 2016-02-09 DIAGNOSIS — Z041 Encounter for examination and observation following transport accident: Secondary | ICD-10-CM | POA: Diagnosis not present

## 2016-02-09 DIAGNOSIS — S3981XA Other specified injuries of abdomen, initial encounter: Secondary | ICD-10-CM | POA: Diagnosis not present

## 2016-02-09 DIAGNOSIS — R404 Transient alteration of awareness: Secondary | ICD-10-CM | POA: Diagnosis not present

## 2016-02-09 DIAGNOSIS — D72829 Elevated white blood cell count, unspecified: Secondary | ICD-10-CM | POA: Diagnosis not present

## 2016-02-09 DIAGNOSIS — I160 Hypertensive urgency: Secondary | ICD-10-CM | POA: Diagnosis not present

## 2016-02-09 DIAGNOSIS — Y9389 Activity, other specified: Secondary | ICD-10-CM | POA: Insufficient documentation

## 2016-02-09 DIAGNOSIS — E876 Hypokalemia: Secondary | ICD-10-CM | POA: Diagnosis not present

## 2016-02-09 DIAGNOSIS — R1031 Right lower quadrant pain: Secondary | ICD-10-CM | POA: Diagnosis not present

## 2016-02-09 DIAGNOSIS — S0990XA Unspecified injury of head, initial encounter: Secondary | ICD-10-CM | POA: Diagnosis not present

## 2016-02-09 DIAGNOSIS — S299XXA Unspecified injury of thorax, initial encounter: Secondary | ICD-10-CM | POA: Diagnosis not present

## 2016-02-09 DIAGNOSIS — T1490 Injury, unspecified: Secondary | ICD-10-CM | POA: Diagnosis not present

## 2016-02-09 DIAGNOSIS — R531 Weakness: Secondary | ICD-10-CM | POA: Diagnosis not present

## 2016-02-09 LAB — CBC WITH DIFFERENTIAL/PLATELET
BASOS ABS: 0 10*3/uL (ref 0.0–0.1)
BASOS PCT: 0 %
Basophils Absolute: 0 10*3/uL (ref 0.0–0.1)
Basophils Relative: 0 %
EOS ABS: 0.2 10*3/uL (ref 0.0–0.7)
EOS ABS: 0.1 10*3/uL (ref 0.0–0.7)
Eosinophils Relative: 2 %
Eosinophils Relative: 1 %
HCT: 43.7 % (ref 39.0–52.0)
HCT: 48 % (ref 39.0–52.0)
Hemoglobin: 14.6 g/dL (ref 13.0–17.0)
Hemoglobin: 16.2 g/dL (ref 13.0–17.0)
LYMPHS ABS: 1 10*3/uL (ref 0.7–4.0)
Lymphocytes Relative: 9 %
Lymphocytes Relative: 7 %
Lymphs Abs: 1 10*3/uL (ref 0.7–4.0)
MCH: 29.7 pg (ref 26.0–34.0)
MCH: 30.1 pg (ref 26.0–34.0)
MCHC: 33.4 g/dL (ref 30.0–36.0)
MCHC: 33.8 g/dL (ref 30.0–36.0)
MCV: 88.8 fL (ref 78.0–100.0)
MCV: 89.1 fL (ref 78.0–100.0)
MONO ABS: 0.5 10*3/uL (ref 0.1–1.0)
Monocytes Absolute: 0.5 10*3/uL (ref 0.1–1.0)
Monocytes Relative: 5 %
Monocytes Relative: 4 %
NEUTROS ABS: 12.2 10*3/uL — AB (ref 1.7–7.7)
Neutro Abs: 10 10*3/uL — ABNORMAL HIGH (ref 1.7–7.7)
Neutrophils Relative %: 84 %
Neutrophils Relative %: 88 %
PLATELETS: 339 10*3/uL (ref 150–400)
Platelets: 304 10*3/uL (ref 150–400)
RBC: 4.92 MIL/uL (ref 4.22–5.81)
RBC: 5.39 MIL/uL (ref 4.22–5.81)
RDW: 14.4 % (ref 11.5–15.5)
RDW: 14.5 % (ref 11.5–15.5)
WBC: 11.7 10*3/uL — ABNORMAL HIGH (ref 4.0–10.5)
WBC: 13.8 10*3/uL — ABNORMAL HIGH (ref 4.0–10.5)

## 2016-02-09 LAB — COMPREHENSIVE METABOLIC PANEL
ALBUMIN: 3.7 g/dL (ref 3.5–5.0)
ALT: 37 U/L (ref 17–63)
AST: 18 U/L (ref 15–41)
Alkaline Phosphatase: 112 U/L (ref 38–126)
Anion gap: 9 (ref 5–15)
BUN: 11 mg/dL (ref 6–20)
CHLORIDE: 104 mmol/L (ref 101–111)
CO2: 27 mmol/L (ref 22–32)
Calcium: 9.2 mg/dL (ref 8.9–10.3)
Creatinine, Ser: 0.88 mg/dL (ref 0.61–1.24)
GFR calc Af Amer: >60 mL/min (ref >60)
GFR calc non Af Amer: >60 mL/min (ref >60)
GLUCOSE: 144 mg/dL — AB (ref 65–99)
POTASSIUM: 3.9 mmol/L (ref 3.5–5.1)
SODIUM: 140 mmol/L (ref 135–145)
Total Bilirubin: 0.5 mg/dL (ref 0.3–1.2)
Total Protein: 7.5 g/dL (ref 6.5–8.1)

## 2016-02-09 LAB — TSH: TSH: 1.7 u[IU]/mL (ref 0.350–4.500)

## 2016-02-09 LAB — I-STAT VENOUS BLOOD GAS, ED
BICARBONATE: 25.9 meq/L — AB (ref 20.0–24.0)
O2 Saturation: 85 %
TCO2: 27 mmol/L (ref 0–100)
pCO2, Ven: 44.1 mmHg — ABNORMAL LOW (ref 45.0–50.0)
pH, Ven: 7.378 — ABNORMAL HIGH (ref 7.250–7.300)
pO2, Ven: 51 mmHg — ABNORMAL HIGH (ref 31.0–45.0)

## 2016-02-09 LAB — BASIC METABOLIC PANEL
Anion gap: 12 (ref 5–15)
BUN: 11 mg/dL (ref 6–20)
CO2: 24 mmol/L (ref 22–32)
CREATININE: 1.03 mg/dL (ref 0.61–1.24)
Calcium: 9.2 mg/dL (ref 8.9–10.3)
Chloride: 107 mmol/L (ref 101–111)
GFR calc Af Amer: >60 mL/min (ref >60)
GFR calc non Af Amer: >60 mL/min (ref >60)
Glucose, Bld: 175 mg/dL — ABNORMAL HIGH (ref 65–99)
Potassium: 3.4 mmol/L — ABNORMAL LOW (ref 3.5–5.1)
SODIUM: 143 mmol/L (ref 135–145)

## 2016-02-09 LAB — BRAIN NATRIURETIC PEPTIDE: B Natriuretic Peptide: 31 pg/mL (ref 0.0–100.0)

## 2016-02-09 LAB — CBG MONITORING, ED
GLUCOSE-CAPILLARY: 155 mg/dL — AB (ref 65–99)
Glucose-Capillary: 138 mg/dL — ABNORMAL HIGH (ref 65–99)

## 2016-02-09 LAB — TROPONIN I: Troponin I: <0.03 ng/mL (ref <0.031)

## 2016-02-09 MED ORDER — ONDANSETRON HCL 4 MG PO TABS: 4.0000 mg | ORAL_TABLET | Freq: Four times a day (QID) | ORAL | Status: DC | PRN

## 2016-02-09 MED ORDER — PANTOPRAZOLE SODIUM 40 MG PO TBEC: 40.0000 mg | DELAYED_RELEASE_TABLET | Freq: Every day | ORAL | Status: DC

## 2016-02-09 MED ORDER — ACETAMINOPHEN 325 MG PO TABS: 650.0000 mg | ORAL_TABLET | Freq: Four times a day (QID) | ORAL | Status: DC | PRN

## 2016-02-09 MED ORDER — SODIUM CHLORIDE 0.9% FLUSH
3.0000 mL | Freq: Two times a day (BID) | INTRAVENOUS | Status: DC
  Administered 2016-02-10 – 2016-02-11 (×3): 3 mL via INTRAVENOUS

## 2016-02-09 MED ORDER — SODIUM CHLORIDE 0.9 % IV BOLUS (SEPSIS)
1000.0000 mL | Freq: Once | INTRAVENOUS | Status: AC
  Administered 2016-02-09: 1000 mL via INTRAVENOUS

## 2016-02-09 MED ORDER — NICARDIPINE HCL IN NACL 20-0.86 MG/200ML-% IV SOLN
3.0000 mg/h | Freq: Once | INTRAVENOUS | Status: AC
  Administered 2016-02-09: 3 mg/h via INTRAVENOUS
  Filled 2016-02-09: qty 200

## 2016-02-09 MED ORDER — LOSARTAN POTASSIUM 25 MG PO TABS
25.0000 mg | ORAL_TABLET | Freq: Every day | ORAL | Status: DC
  Administered 2016-02-10 – 2016-02-12 (×3): 25 mg via ORAL
  Filled 2016-02-09 (×5): qty 1

## 2016-02-09 MED ORDER — HYDROCODONE-ACETAMINOPHEN 5-325 MG PO TABS
1.0000 | ORAL_TABLET | ORAL | Status: DC | PRN
  Administered 2016-02-10: 1 via ORAL
  Administered 2016-02-11: 2 via ORAL
  Filled 2016-02-09: qty 2
  Filled 2016-02-09: qty 1

## 2016-02-09 MED ORDER — SODIUM CHLORIDE 0.9 % IV SOLN
INTRAVENOUS | Status: DC
Start: 2016-02-09 — End: 2016-02-10
  Administered 2016-02-09: 23:00:00 via INTRAVENOUS

## 2016-02-09 MED ORDER — HYDRALAZINE HCL 20 MG/ML IJ SOLN
10.0000 mg | Freq: Once | INTRAMUSCULAR | Status: AC
  Administered 2016-02-09: 10 mg via INTRAVENOUS
  Filled 2016-02-09: qty 1

## 2016-02-09 MED ORDER — ONDANSETRON HCL 4 MG/2ML IJ SOLN: 4.0000 mg | Freq: Four times a day (QID) | INTRAMUSCULAR | Status: DC | PRN

## 2016-02-09 MED ORDER — FAMOTIDINE 20 MG PO TABS
20.0000 mg | ORAL_TABLET | Freq: Every day | ORAL | Status: DC
  Administered 2016-02-10 – 2016-02-12 (×3): 20 mg via ORAL
  Filled 2016-02-09 (×3): qty 1

## 2016-02-09 MED ORDER — HYDROMORPHONE HCL 1 MG/ML IJ SOLN: 1.0000 mg | INTRAMUSCULAR | Status: DC | PRN

## 2016-02-09 MED ORDER — POLYETHYLENE GLYCOL 3350 17 G PO PACK: 17.0000 g | PACK | Freq: Every day | ORAL | Status: DC | PRN

## 2016-02-09 MED ORDER — MORPHINE SULFATE (PF) 4 MG/ML IV SOLN
4.0000 mg | Freq: Once | INTRAVENOUS | Status: AC
  Administered 2016-02-09: 4 mg via INTRAVENOUS
  Filled 2016-02-09: qty 1

## 2016-02-09 MED ORDER — SODIUM CHLORIDE 0.9 % IV BOLUS (SEPSIS)
1000.0000 mL | Freq: Once | INTRAVENOUS | Status: AC
Start: 2016-02-09 — End: 2016-02-09
  Administered 2016-02-09: 1000 mL via INTRAVENOUS

## 2016-02-09 MED ORDER — IOPAMIDOL (ISOVUE-300) INJECTION 61%
100.0000 mL | Freq: Once | INTRAVENOUS | Status: AC | PRN
  Administered 2016-02-09: 100 mL via INTRAVENOUS

## 2016-02-09 MED ORDER — LORATADINE 10 MG PO TABS
10.0000 mg | ORAL_TABLET | Freq: Every day | ORAL | Status: DC
  Administered 2016-02-10 – 2016-02-12 (×3): 10 mg via ORAL
  Filled 2016-02-09 (×3): qty 1

## 2016-02-09 MED ORDER — ACETAMINOPHEN 650 MG RE SUPP: 650.0000 mg | Freq: Four times a day (QID) | RECTAL | Status: DC | PRN

## 2016-02-09 NOTE — ED Notes (Signed)
Approached MD regarding patient BP of 203/120 before discharging patient. MD has no recommendations at this time.
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
## 2016-02-09 NOTE — ED Notes (Signed)
Dr Lockwood at bedside,  

## 2016-02-09 NOTE — Discharge Instructions (Signed)

## 2016-02-09 NOTE — ED Notes (Signed)
Dr Vanita Panda notified of pt's bp and pain level
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
## 2016-02-09 NOTE — ED Notes (Signed)
Pt and family updated on plan of care,  
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
## 2016-02-09 NOTE — ED Notes (Signed)
Pt ambulatory without difficulty at departure. Patient advised to see his PCP as soon as possible for concern of elevated BP. Pt reports he will try to see his doctor today. Pt is alert and oriented at this time. Wheeled to vehicle via wheelchair. Driven home by wife.
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
## 2016-02-10 ENCOUNTER — Inpatient Hospital Stay (HOSPITAL_COMMUNITY): Payer: PPO

## 2016-02-10 DIAGNOSIS — R1084 Generalized abdominal pain: Secondary | ICD-10-CM | POA: Diagnosis not present

## 2016-02-10 DIAGNOSIS — R55 Syncope and collapse: Secondary | ICD-10-CM | POA: Diagnosis not present

## 2016-02-10 DIAGNOSIS — I160 Hypertensive urgency: Principal | ICD-10-CM

## 2016-02-10 LAB — CBC
HCT: 41.9 % (ref 39.0–52.0)
Hemoglobin: 13.6 g/dL (ref 13.0–17.0)
MCH: 28.9 pg (ref 26.0–34.0)
MCHC: 32.5 g/dL (ref 30.0–36.0)
MCV: 89.1 fL (ref 78.0–100.0)
PLATELETS: 347 10*3/uL (ref 150–400)
RBC: 4.7 MIL/uL (ref 4.22–5.81)
RDW: 14.7 % (ref 11.5–15.5)
WBC: 11.1 10*3/uL — ABNORMAL HIGH (ref 4.0–10.5)

## 2016-02-10 LAB — GLUCOSE, CAPILLARY: GLUCOSE-CAPILLARY: 108 mg/dL — AB (ref 65–99)

## 2016-02-10 LAB — URINALYSIS, ROUTINE W REFLEX MICROSCOPIC
Bilirubin Urine: NEGATIVE
Glucose, UA: NEGATIVE mg/dL
Hgb urine dipstick: NEGATIVE
Ketones, ur: NEGATIVE mg/dL
LEUKOCYTES UA: NEGATIVE
NITRITE: NEGATIVE
PH: 5.5 (ref 5.0–8.0)
Protein, ur: NEGATIVE mg/dL

## 2016-02-10 LAB — ECHOCARDIOGRAM COMPLETE
HEIGHTINCHES: 68 in
Weight: 2917.13 oz

## 2016-02-10 LAB — BASIC METABOLIC PANEL
ANION GAP: 8 (ref 5–15)
BUN: 10 mg/dL (ref 6–20)
CALCIUM: 8.5 mg/dL — AB (ref 8.9–10.3)
CO2: 25 mmol/L (ref 22–32)
CREATININE: 0.93 mg/dL (ref 0.61–1.24)
Chloride: 107 mmol/L (ref 101–111)
Glucose, Bld: 121 mg/dL — ABNORMAL HIGH (ref 65–99)
Potassium: 3 mmol/L — ABNORMAL LOW (ref 3.5–5.1)
SODIUM: 140 mmol/L (ref 135–145)

## 2016-02-10 LAB — PROTIME-INR
INR: 1.11 (ref 0.00–1.49)
PROTHROMBIN TIME: 14.5 s (ref 11.6–15.2)

## 2016-02-10 LAB — APTT: APTT: 25 s (ref 24–37)

## 2016-02-10 LAB — MRSA PCR SCREENING: MRSA by PCR: NEGATIVE

## 2016-02-10 MED ORDER — NICARDIPINE HCL IN NACL 20-0.86 MG/200ML-% IV SOLN
3.0000 mg/h | INTRAVENOUS | Status: DC
  Administered 2016-02-10 – 2016-02-11 (×2): 3 mg/h via INTRAVENOUS
  Filled 2016-02-10 (×3): qty 200

## 2016-02-10 MED ORDER — KCL IN DEXTROSE-NACL 20-5-0.45 MEQ/L-%-% IV SOLN
INTRAVENOUS | Status: DC
  Administered 2016-02-10: 09:00:00 via INTRAVENOUS
  Administered 2016-02-11: 1000 mL via INTRAVENOUS

## 2016-02-10 MED ORDER — METOPROLOL TARTRATE 25 MG PO TABS
25.0000 mg | ORAL_TABLET | Freq: Two times a day (BID) | ORAL | Status: DC
  Administered 2016-02-10 – 2016-02-11 (×3): 25 mg via ORAL
  Filled 2016-02-10 (×3): qty 1

## 2016-02-10 MED ORDER — POTASSIUM CHLORIDE 10 MEQ/100ML IV SOLN
10.0000 meq | INTRAVENOUS | Status: AC
Start: 2016-02-10 — End: 2016-02-10
  Administered 2016-02-10 (×4): 10 meq via INTRAVENOUS
  Filled 2016-02-10 (×4): qty 100

## 2016-02-10 NOTE — Consult Note (Signed)
   [facility] CM Inpatient Consult   02/10/2016  Curtis Marquez [birth year] [id number]  Spoke with patient wife at bedside, as patient sleeping, regarding [facility] services. Patient wife wanted more information regarding [employer] program and will discuss with patient when he is awake and feeling better. Patient given [facility] brochure and contact information for future reference.  Of note, [facility] Care Management services would not replace or interfere with any services that are arranged by inpatient case management or social work. For additional questions or referrals please contact:  Royetta Crochet. Laymond Purser, RN, BSN, [facility] Liaison [phone]

## 2016-02-10 NOTE — Progress Notes (Signed)
*  PRELIMINARY RESULTS* Echocardiogram 2D Echocardiogram has been performed.  Curtis Marquez 02/10/2016, 3:14 PM
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
## 2016-02-11 DIAGNOSIS — I160 Hypertensive urgency: Secondary | ICD-10-CM | POA: Diagnosis not present

## 2016-02-11 LAB — MAGNESIUM: Magnesium: 2 mg/dL (ref 1.7–2.4)

## 2016-02-11 LAB — CBC
HEMATOCRIT: 41.4 % (ref 39.0–52.0)
Hemoglobin: 13.6 g/dL (ref 13.0–17.0)
MCH: 29.4 pg (ref 26.0–34.0)
MCHC: 32.9 g/dL (ref 30.0–36.0)
MCV: 89.6 fL (ref 78.0–100.0)
PLATELETS: 326 10*3/uL (ref 150–400)
RBC: 4.62 MIL/uL (ref 4.22–5.81)
RDW: 14.9 % (ref 11.5–15.5)
WBC: 9.9 10*3/uL (ref 4.0–10.5)

## 2016-02-11 LAB — BASIC METABOLIC PANEL WITH GFR
Anion gap: 9 (ref 5–15)
BUN: 11 mg/dL (ref 6–20)
CO2: 25 mmol/L (ref 22–32)
Calcium: 8.6 mg/dL — ABNORMAL LOW (ref 8.9–10.3)
Chloride: 109 mmol/L (ref 101–111)
Creatinine, Ser: 0.98 mg/dL (ref 0.61–1.24)
GFR calc Af Amer: >60 mL/min
GFR calc non Af Amer: >60 mL/min
Glucose, Bld: 119 mg/dL — ABNORMAL HIGH (ref 65–99)
Potassium: 3.4 mmol/L — ABNORMAL LOW (ref 3.5–5.1)
Sodium: 143 mmol/L (ref 135–145)

## 2016-02-11 MED ORDER — HYDRALAZINE HCL 20 MG/ML IJ SOLN
10.0000 mg | Freq: Four times a day (QID) | INTRAMUSCULAR | Status: DC | PRN
  Administered 2016-02-11 – 2016-02-12 (×3): 10 mg via INTRAVENOUS
  Filled 2016-02-11 (×3): qty 1

## 2016-02-11 MED ORDER — METOPROLOL TARTRATE 50 MG PO TABS
50.0000 mg | ORAL_TABLET | Freq: Two times a day (BID) | ORAL | Status: DC
  Administered 2016-02-11 – 2016-02-12 (×2): 50 mg via ORAL
  Filled 2016-02-11 (×2): qty 1

## 2016-02-11 MED ORDER — POTASSIUM CHLORIDE CRYS ER 20 MEQ PO TBCR
40.0000 meq | EXTENDED_RELEASE_TABLET | Freq: Once | ORAL | Status: AC
  Administered 2016-02-11: 40 meq via ORAL
  Filled 2016-02-11: qty 2
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
## 2016-02-11 NOTE — Care Management Important Message (Signed)
Important Message  Patient Details  Name: Curtis Marquez MRN: [id number] Date of Birth: [birth year]   Medicare Important Message Given:  Yes    Sherald Barge, RN 02/11/2016, 11:35 AM
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
## 2016-02-11 NOTE — Care Management Note (Signed)
Case Management Note  Patient Details  Name: Daryk Bohanan MRN: [id number] Date of Birth: Jan 04, 1946  Subjective/Objective:                  Pt is from home, lives with wife and is ind with ADL's. Pt has PCP, transportation to appointments and no difficulty affording medications. Pt plans to return home with self care at DC.   Action/Plan: No CM needs.   Expected Discharge Date:      02/13/2016            Expected Discharge Plan:  Home/Self Care  In-House Referral:  NA  Discharge planning Services  CM Consult  Post Acute Care Choice:  NA Choice offered to:  NA  DME Arranged:    DME Agency:     HH Arranged:    HH Agency:     Status of Service:  Completed, signed off  Medicare Important Message Given:  Yes Date Medicare IM Given:    Medicare IM give by:    Date Additional Medicare IM Given:    Additional Medicare Important Message give by:     If discussed at [place] of Stay Meetings, dates discussed:    Additional Comments:  Sherald Barge, RN 02/11/2016, 11:35 AM

## 2016-02-12 DIAGNOSIS — I160 Hypertensive urgency: Secondary | ICD-10-CM | POA: Diagnosis not present

## 2016-02-12 LAB — GLUCOSE, CAPILLARY: Glucose-Capillary: 94 mg/dL (ref 65–99)

## 2016-02-12 MED ORDER — METOPROLOL TARTRATE 50 MG PO TABS: 50.0000 mg | ORAL_TABLET | Freq: Two times a day (BID) | ORAL | Status: DC

## 2016-02-12 NOTE — Progress Notes (Signed)
Patient's blood pressure seems under better control. He denies any abdominal pain. Is tolerating soft diet well. Abdomen unremarkable. Okay for discharge from surgery standpoint. Patient is to follow-up in the emergency room should his abdominal pain recur or worsen.

## 2016-02-12 NOTE — Progress Notes (Signed)
Discharged to home, all IV removed and gave patient and wife prescriptions and then rolled to outside by wheel chair and aided into car and wife drove patient home.
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
## 2016-02-14 DIAGNOSIS — Z299 Encounter for prophylactic measures, unspecified: Secondary | ICD-10-CM | POA: Diagnosis not present

## 2016-02-14 DIAGNOSIS — Z789 Other specified health status: Secondary | ICD-10-CM | POA: Diagnosis not present

## 2016-02-14 DIAGNOSIS — R55 Syncope and collapse: Secondary | ICD-10-CM | POA: Diagnosis not present

## 2016-02-14 DIAGNOSIS — I10 Essential (primary) hypertension: Secondary | ICD-10-CM | POA: Diagnosis not present

## 2016-02-14 DIAGNOSIS — Z6828 Body mass index (BMI) 28.0-28.9, adult: Secondary | ICD-10-CM | POA: Diagnosis not present

## 2016-02-23 DIAGNOSIS — I10 Essential (primary) hypertension: Secondary | ICD-10-CM | POA: Diagnosis not present

## 2016-02-23 DIAGNOSIS — R9721 Rising PSA following treatment for malignant neoplasm of prostate: Secondary | ICD-10-CM | POA: Diagnosis not present

## 2016-02-23 DIAGNOSIS — Z923 Personal history of irradiation: Secondary | ICD-10-CM | POA: Diagnosis not present

## 2016-02-23 DIAGNOSIS — E7800 Pure hypercholesterolemia, unspecified: Secondary | ICD-10-CM | POA: Diagnosis not present

## 2016-03-15 DIAGNOSIS — I10 Essential (primary) hypertension: Secondary | ICD-10-CM | POA: Diagnosis not present

## 2016-05-15 DIAGNOSIS — Z1389 Encounter for screening for other disorder: Secondary | ICD-10-CM | POA: Diagnosis not present

## 2016-05-15 DIAGNOSIS — R5383 Other fatigue: Secondary | ICD-10-CM | POA: Diagnosis not present

## 2016-05-15 DIAGNOSIS — Z1211 Encounter for screening for malignant neoplasm of colon: Secondary | ICD-10-CM | POA: Diagnosis not present

## 2016-05-15 DIAGNOSIS — Z7189 Other specified counseling: Secondary | ICD-10-CM | POA: Diagnosis not present

## 2016-05-15 DIAGNOSIS — Z6828 Body mass index (BMI) 28.0-28.9, adult: Secondary | ICD-10-CM | POA: Diagnosis not present

## 2016-05-15 DIAGNOSIS — Z299 Encounter for prophylactic measures, unspecified: Secondary | ICD-10-CM | POA: Diagnosis not present

## 2016-05-15 DIAGNOSIS — Z0000 Encounter for general adult medical examination without abnormal findings: Secondary | ICD-10-CM | POA: Diagnosis not present

## 2016-05-15 DIAGNOSIS — Z79899 Other long term (current) drug therapy: Secondary | ICD-10-CM | POA: Diagnosis not present

## 2016-05-15 DIAGNOSIS — Z125 Encounter for screening for malignant neoplasm of prostate: Secondary | ICD-10-CM | POA: Diagnosis not present

## 2016-05-15 DIAGNOSIS — E7800 Pure hypercholesterolemia, unspecified: Secondary | ICD-10-CM | POA: Diagnosis not present

## 2016-05-19 DIAGNOSIS — R7309 Other abnormal glucose: Secondary | ICD-10-CM | POA: Diagnosis not present

## 2016-05-19 DIAGNOSIS — R946 Abnormal results of thyroid function studies: Secondary | ICD-10-CM | POA: Diagnosis not present

## 2016-05-19 DIAGNOSIS — C61 Malignant neoplasm of prostate: Secondary | ICD-10-CM | POA: Diagnosis not present

## 2016-05-19 DIAGNOSIS — N393 Stress incontinence (female) (male): Secondary | ICD-10-CM | POA: Diagnosis not present

## 2016-05-19 DIAGNOSIS — E7800 Pure hypercholesterolemia, unspecified: Secondary | ICD-10-CM | POA: Diagnosis not present

## 2016-05-19 DIAGNOSIS — I10 Essential (primary) hypertension: Secondary | ICD-10-CM | POA: Diagnosis not present

## 2016-06-15 DIAGNOSIS — I10 Essential (primary) hypertension: Secondary | ICD-10-CM | POA: Diagnosis not present

## 2016-06-15 DIAGNOSIS — E7800 Pure hypercholesterolemia, unspecified: Secondary | ICD-10-CM | POA: Diagnosis not present

## 2016-06-27 ENCOUNTER — Encounter: Payer: Self-pay | Admitting: Medical Oncology

## 2016-06-27 NOTE — Progress Notes (Signed)
Curtis Marquez completed radiation in [place], [place] under the care of Dr. Tammi Klippel March-April. He missed 4 treatments due to a syncopal episode. He was seen by Dr. Alinda Money  05/19/16 and is doing well. He will follow up in December to recheck PSA and see Dr. Alinda Money.

## 2016-07-10 ENCOUNTER — Encounter: Payer: Self-pay | Admitting: Emergency Medicine

## 2016-07-10 ENCOUNTER — Emergency Department: Payer: PPO

## 2016-07-10 ENCOUNTER — Observation Stay
Admission: EM | Admit: 2016-07-10 | Discharge: 2016-07-11 | Disposition: A | Discharge status: Home / Self Care | Payer: PPO | Attending: Internal Medicine | Admitting: Internal Medicine

## 2016-07-10 DIAGNOSIS — E7800 Pure hypercholesterolemia, unspecified: Secondary | ICD-10-CM | POA: Diagnosis not present

## 2016-07-10 DIAGNOSIS — I10 Essential (primary) hypertension: Secondary | ICD-10-CM | POA: Diagnosis not present

## 2016-07-10 DIAGNOSIS — Z87891 Personal history of nicotine dependence: Secondary | ICD-10-CM | POA: Insufficient documentation

## 2016-07-10 DIAGNOSIS — Z8603 Personal history of neoplasm of uncertain behavior: Secondary | ICD-10-CM | POA: Insufficient documentation

## 2016-07-10 DIAGNOSIS — Z8249 Family history of ischemic heart disease and other diseases of the circulatory system: Secondary | ICD-10-CM | POA: Insufficient documentation

## 2016-07-10 DIAGNOSIS — R531 Weakness: Secondary | ICD-10-CM | POA: Diagnosis not present

## 2016-07-10 DIAGNOSIS — Z823 Family history of stroke: Secondary | ICD-10-CM | POA: Diagnosis not present

## 2016-07-10 DIAGNOSIS — Z8546 Personal history of malignant neoplasm of prostate: Secondary | ICD-10-CM | POA: Insufficient documentation

## 2016-07-10 DIAGNOSIS — K219 Gastro-esophageal reflux disease without esophagitis: Secondary | ICD-10-CM | POA: Insufficient documentation

## 2016-07-10 DIAGNOSIS — Z833 Family history of diabetes mellitus: Secondary | ICD-10-CM | POA: Diagnosis not present

## 2016-07-10 DIAGNOSIS — R55 Syncope and collapse: Secondary | ICD-10-CM

## 2016-07-10 DIAGNOSIS — Z9079 Acquired absence of other genital organ(s): Secondary | ICD-10-CM | POA: Diagnosis not present

## 2016-07-10 DIAGNOSIS — E876 Hypokalemia: Principal | ICD-10-CM | POA: Diagnosis present

## 2016-07-10 DIAGNOSIS — R918 Other nonspecific abnormal finding of lung field: Secondary | ICD-10-CM | POA: Diagnosis not present

## 2016-07-10 HISTORY — DX: Neoplasm of unspecified behavior of brain: D49.6

## 2016-07-10 LAB — URINALYSIS COMPLETE WITH MICROSCOPIC (ARMC ONLY)
BILIRUBIN URINE: NEGATIVE
GLUCOSE, UA: NEGATIVE mg/dL
Hgb urine dipstick: NEGATIVE
KETONES UR: NEGATIVE mg/dL
Leukocytes, UA: NEGATIVE
NITRITE: NEGATIVE
PH: 5 (ref 5.0–8.0)
Protein, ur: 100 mg/dL — AB
RBC / HPF: NONE SEEN RBC/hpf (ref 0–5)
Specific Gravity, Urine: 1.02 (ref 1.005–1.030)

## 2016-07-10 LAB — COMPREHENSIVE METABOLIC PANEL
ALK PHOS: 92 U/L (ref 38–126)
ALT: 24 U/L (ref 17–63)
ANION GAP: 10 (ref 5–15)
AST: 25 U/L (ref 15–41)
Albumin: 3.8 g/dL (ref 3.5–5.0)
BILIRUBIN TOTAL: 0.7 mg/dL (ref 0.3–1.2)
BUN: 13 mg/dL (ref 6–20)
CALCIUM: 9 mg/dL (ref 8.9–10.3)
CO2: 26 mmol/L (ref 22–32)
CREATININE: 1.18 mg/dL (ref 0.61–1.24)
Chloride: 104 mmol/L (ref 101–111)
GFR calc non Af Amer: >60 mL/min (ref >60)
GLUCOSE: 139 mg/dL — AB (ref 65–99)
Potassium: 2.8 mmol/L — ABNORMAL LOW (ref 3.5–5.1)
Sodium: 140 mmol/L (ref 135–145)
TOTAL PROTEIN: 7.5 g/dL (ref 6.5–8.1)

## 2016-07-10 LAB — TROPONIN I: Troponin I: <0.03 ng/mL (ref <0.03)

## 2016-07-10 LAB — CBC
HCT: 39.2 % — ABNORMAL LOW (ref 40.0–52.0)
HEMOGLOBIN: 13.6 g/dL (ref 13.0–18.0)
MCH: 29.9 pg (ref 26.0–34.0)
MCHC: 34.6 g/dL (ref 32.0–36.0)
MCV: 86.3 fL (ref 80.0–100.0)
PLATELETS: 355 10*3/uL (ref 150–440)
RBC: 4.54 MIL/uL (ref 4.40–5.90)
RDW: 14.1 % (ref 11.5–14.5)
WBC: 11.4 10*3/uL — ABNORMAL HIGH (ref 3.8–10.6)

## 2016-07-10 LAB — GLUCOSE, CAPILLARY: GLUCOSE-CAPILLARY: 125 mg/dL — AB (ref 65–99)

## 2016-07-10 LAB — LIPASE, BLOOD: Lipase: 26 U/L (ref 11–51)

## 2016-07-10 LAB — MAGNESIUM: MAGNESIUM: 2.1 mg/dL (ref 1.7–2.4)

## 2016-07-10 MED ORDER — METOPROLOL TARTRATE 50 MG PO TABS
50.0000 mg | ORAL_TABLET | Freq: Two times a day (BID) | ORAL | Status: DC
  Administered 2016-07-10 – 2016-07-11 (×3): 50 mg via ORAL
  Filled 2016-07-10 (×3): qty 1

## 2016-07-10 MED ORDER — MAGNESIUM SULFATE 2 GM/50ML IV SOLN
2.0000 g | Freq: Once | INTRAVENOUS | Status: AC
  Administered 2016-07-10: 11:00:00 2 g via INTRAVENOUS
  Filled 2016-07-10: qty 50

## 2016-07-10 MED ORDER — ACETAMINOPHEN 325 MG PO TABS
650.0000 mg | ORAL_TABLET | Freq: Four times a day (QID) | ORAL | Status: DC | PRN
Start: 2016-07-10 — End: 2016-07-11

## 2016-07-10 MED ORDER — POTASSIUM CHLORIDE IN NACL 20-0.9 MEQ/L-% IV SOLN
Freq: Once | INTRAVENOUS | Status: AC
  Administered 2016-07-10: 08:00:00 via INTRAVENOUS
  Filled 2016-07-10: qty 1000

## 2016-07-10 MED ORDER — ACETAMINOPHEN 650 MG RE SUPP
650.0000 mg | Freq: Four times a day (QID) | RECTAL | Status: DC | PRN
Start: 2016-07-10 — End: 2016-07-11

## 2016-07-10 MED ORDER — POTASSIUM CHLORIDE CRYS ER 20 MEQ PO TBCR
40.0000 meq | EXTENDED_RELEASE_TABLET | Freq: Once | ORAL | Status: AC
  Administered 2016-07-10: 40 meq via ORAL
  Filled 2016-07-10: qty 2

## 2016-07-10 MED ORDER — FAMOTIDINE 20 MG PO TABS
20.0000 mg | ORAL_TABLET | Freq: Every day | ORAL | Status: DC
  Administered 2016-07-10: 20 mg via ORAL
  Filled 2016-07-10: qty 1

## 2016-07-10 MED ORDER — ASPIRIN 81 MG PO CHEW
324.0000 mg | CHEWABLE_TABLET | Freq: Once | ORAL | Status: AC
  Administered 2016-07-10: 324 mg via ORAL
  Filled 2016-07-10: qty 4

## 2016-07-10 MED ORDER — ENOXAPARIN SODIUM 40 MG/0.4ML ~~LOC~~ SOLN
40.0000 mg | SUBCUTANEOUS | Status: DC
  Administered 2016-07-10: 21:00:00 40 mg via SUBCUTANEOUS
  Filled 2016-07-10: qty 0.4

## 2016-07-10 MED ORDER — SODIUM CHLORIDE 0.9% FLUSH
3.0000 mL | Freq: Two times a day (BID) | INTRAVENOUS | Status: DC
  Administered 2016-07-10 – 2016-07-11 (×3): 3 mL via INTRAVENOUS

## 2016-07-10 MED ORDER — LOSARTAN POTASSIUM 50 MG PO TABS
25.0000 mg | ORAL_TABLET | Freq: Every evening | ORAL | Status: DC
  Administered 2016-07-10: 18:00:00 25 mg via ORAL
  Filled 2016-07-10: qty 1

## 2016-07-10 MED ORDER — AMLODIPINE BESYLATE 5 MG PO TABS
10.0000 mg | ORAL_TABLET | Freq: Every evening | ORAL | Status: DC
  Administered 2016-07-10: 10 mg via ORAL
  Filled 2016-07-10: qty 2
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
## 2016-07-10 NOTE — H&P (Signed)
[facility] [place] NAME: Curtis Marquez    MR#:  [id number]  DATE OF BIRTH:  1946-03-07  DATE OF ADMISSION:  07/10/2016  PRIMARY CARE PHYSICIAN: Minta Balsam, MD   REQUESTING/REFERRING PHYSICIAN: Dr Harvest Dark  CHIEF COMPLAINT:   Felt a little drowsy got hot and dizzy and sweaty.  HISTORY OF PRESENT ILLNESS:  Curtis Marquez  is a 70 y.o. male with a known history of Hypertension. Today he was on his way to work in a car. He was not driving. He got a little drowsy got hot and dizzy and sweaty and not feeling good. He felt faint and decided to come to the emergency room. No complaints of chest pain or shortness of breath. In the ER his potassium was found to be 2.8. ER physician was concerned that he had some flipped T's laterally on EKG. Hospitalist services were contacted for further evaluation.  PAST MEDICAL HISTORY:   Past Medical History:  Diagnosis Date  . Brain tumor ([place])   . GERD (gastroesophageal reflux disease)   . Hypercholesteremia   . Hypertension   . Prostate cancer ([place]) 11/13/13    PAST SURGICAL HISTORY:   Past Surgical History:  Procedure Laterality Date  . BRAIN TUMOR EXCISION  1994  . LYMPHADENECTOMY Bilateral 11/13/2013   Procedure: LYMPHADENECTOMY "PELVIC LYMPH NODE DISSECTION";  Surgeon: Dutch Gray, MD;  Location: [facility];  Service: Urology;  Laterality: Bilateral;  . ROBOT ASSISTED LAPAROSCOPIC RADICAL PROSTATECTOMY N/A 11/13/2013   Procedure: ROBOTIC ASSISTED LAPAROSCOPIC RADICAL PROSTATECTOMY LEVEL 2;  Surgeon: Dutch Gray, MD;  Location: [facility];  Service: Urology;  Laterality: N/A;    SOCIAL HISTORY:   Social History  Substance Use Topics  . Smoking status: Former Research scientist (life sciences)  . Smokeless tobacco: Never Used  . Alcohol use No    FAMILY HISTORY:   Family History  Problem Relation Age of Onset  . CVA Father   . Diabetes Mother   . Hypertension Mother   . Diabetes Sister   . Hypertension Sister   .  Cancer Brother     DRUG ALLERGIES:  No Known Allergies  REVIEW OF SYSTEMS:  CONSTITUTIONAL: No fever, fatigue or weakness. Positive for sweating. Positive for weight gain. EYES: No blurred or double vision.  EARS, NOSE, AND THROAT: No tinnitus or ear pain. No sore throat RESPIRATORY: No cough, shortness of breath, wheezing or hemoptysis.  CARDIOVASCULAR: No chest pain, orthopnea, edema.  GASTROINTESTINAL: No nausea, vomiting, diarrhea or abdominal pain. No blood in bowel movements GENITOURINARY: No dysuria, hematuria.  ENDOCRINE: No polyuria, nocturia,  HEMATOLOGY: No anemia, easy bruising or bleeding SKIN: No rash or lesion. MUSCULOSKELETAL: No joint pain or arthritis.   NEUROLOGIC: Near syncope PSYCHIATRY: No anxiety or depression.   MEDICATIONS AT HOME:   Prior to Admission medications   Medication Sig Start Date End Date Taking? Authorizing Provider  amLODipine (NORVASC) 10 MG tablet Take 10 mg by mouth every evening.    Yes Historical Provider, MD  Loratadine-Pseudoephedrine (CLARITIN-D 12 HOUR PO) Take 1 tablet by mouth every evening.    Yes Historical Provider, MD  metoprolol (LOPRESSOR) 50 MG tablet Take 1 tablet (50 mg total) by mouth 2 (two) times daily. Patient taking differently: Take 50 mg by mouth every evening.  02/12/16  Yes Estela Leonie Green, MD  ranitidine (ZANTAC) 150 MG tablet Take 150 mg by mouth daily.    Yes Historical Provider, MD  losartan (COZAAR) 25 MG tablet Take 25 mg  by mouth every evening.     Historical Provider, MD      VITAL SIGNS:  Blood pressure 137/85, pulse (!) 58, temperature 97.9 F (36.6 C), temperature source Oral, resp. rate 17, height 5' 8"  (1.727 m), weight 87.1 kg (192 lb), SpO2 94 %.  PHYSICAL EXAMINATION:  GENERAL:  70 y.o.-year-old patient lying in the bed with no acute distress.  EYES: Pupils equal, round, reactive to light and accommodation. No scleral icterus. Extraocular muscles intact.  HEENT: Head atraumatic,  normocephalic. Oropharynx and nasopharynx clear.  NECK:  Supple, no jugular venous distention. No thyroid enlargement, no tenderness.  LUNGS: Normal breath sounds bilaterally, no wheezing, rales,rhonchi or crepitation. No use of accessory muscles of respiration.  CARDIOVASCULAR: S1, S2 normal. No murmurs, rubs, or gallops.  ABDOMEN: Soft, nontender, nondistended. Bowel sounds present. No organomegaly or mass.  EXTREMITIES: No pedal edema, cyanosis, or clubbing.  NEUROLOGIC: Cranial nerves II through XII are intact. Muscle strength 5/5 in all extremities. Sensation intact. Gait not checked.  PSYCHIATRIC: The patient is alert and oriented x 3.  SKIN: No rash, lesion, or ulcer.   LABORATORY PANEL:   CBC  Recent Labs Lab 07/10/16 0619  WBC 11.4*  HGB 13.6  HCT 39.2*  PLT 355   ------------------------------------------------------------------------------------------------------------------  Chemistries   Recent Labs Lab 07/10/16 0619  NA 140  K 2.8*  CL 104  CO2 26  GLUCOSE 139*  BUN 13  CREATININE 1.18  CALCIUM 9.0  AST 25  ALT 24  ALKPHOS 92  BILITOT 0.7   ------------------------------------------------------------------------------------------------------------------  Cardiac Enzymes  Recent Labs Lab 07/10/16 0619  TROPONINI <0.03   ------------------------------------------------------------------------------------------------------------------  RADIOLOGY:  Dg Chest 2 View  Result Date: 07/10/2016 CLINICAL DATA:  70 year old male with vomiting, diaphoresis and near syncope this morning. Initial encounter. EXAM: CHEST  2 VIEW COMPARISON:  [facility] chest radiographs 02/09/2016. [facility] portable chest 09/29/2015, and earlier. FINDINGS: Chronically low lung volumes. Stable cardiac size at the upper limits of normal to mildly enlarged. Other mediastinal contours are within normal limits. Visualized tracheal air column is within normal  limits. No pneumothorax, pulmonary edema, pleural effusion or confluent pulmonary opacity. Crowding of markings at both lung bases. No acute osseous abnormality identified. IMPRESSION: Chronically low lung volumes.  No acute cardiopulmonary abnormality. Electronically Signed   By: Genevie Ann M.D.   On: 07/10/2016 07:34    EKG:   Normal sinus rhythm. Flipped T waves laterally, 1 and aVL  IMPRESSION AND PLAN:   1. Hypokalemia. Patient is not on any medications that can cause this. Replace potassium IV and orally. Check a magnesium and replace that also. Check a renin and aldosterone. 2. Essential hypertension continue usual medications 3. Near syncope. Monitor on telemetry for arrhythmias. Patient had syncope workup back in April side will not repeat any further studies. 4. History of prostate cancer 5. History of brain tumor   All the records are reviewed and case discussed with ED provider. Management plans discussed with the patient, family and they are in agreement.  CODE STATUS: Full code  TOTAL TIME TAKING CARE OF THIS PATIENT: 50 minutes.    Loletha Grayer M.D on 07/10/2016 at 8:03 AM  Between 7am to 6pm - Pager - [phone]  After 6pm call admission pager [phone]  [facility] Office  [phone]  CC: Primary care physician; Minta Balsam, MD

## 2016-07-10 NOTE — ED Triage Notes (Signed)
Patient started vomiting and feeling hot this morning. Patient very diaphoretic.

## 2016-07-11 LAB — CBC
HEMATOCRIT: 39.6 % — AB (ref 40.0–52.0)
HEMOGLOBIN: 13.9 g/dL (ref 13.0–18.0)
MCH: 30.4 pg (ref 26.0–34.0)
MCHC: 35.1 g/dL (ref 32.0–36.0)
MCV: 86.5 fL (ref 80.0–100.0)
Platelets: 288 10*3/uL (ref 150–440)
RBC: 4.57 MIL/uL (ref 4.40–5.90)
RDW: 14 % (ref 11.5–14.5)
WBC: 7.8 10*3/uL (ref 3.8–10.6)

## 2016-07-11 LAB — BASIC METABOLIC PANEL
ANION GAP: 6 (ref 5–15)
BUN: 15 mg/dL (ref 6–20)
CO2: 25 mmol/L (ref 22–32)
Calcium: 8.7 mg/dL — ABNORMAL LOW (ref 8.9–10.3)
Chloride: 110 mmol/L (ref 101–111)
Creatinine, Ser: 0.96 mg/dL (ref 0.61–1.24)
GFR calc Af Amer: >60 mL/min (ref >60)
GLUCOSE: 155 mg/dL — AB (ref 65–99)
POTASSIUM: 3.6 mmol/L (ref 3.5–5.1)
Sodium: 141 mmol/L (ref 135–145)

## 2016-07-11 MED ORDER — METOPROLOL SUCCINATE ER 100 MG PO TB24: 100.0000 mg | ORAL_TABLET | Freq: Every day | ORAL | 0 refills | Status: DC

## 2016-07-11 MED ORDER — POTASSIUM CHLORIDE CRYS ER 20 MEQ PO TBCR
40.0000 meq | EXTENDED_RELEASE_TABLET | Freq: Once | ORAL | Status: AC
  Administered 2016-07-11: 40 meq via ORAL
  Filled 2016-07-11: qty 2

## 2016-07-11 MED ORDER — POTASSIUM CHLORIDE CRYS ER 10 MEQ PO TBCR: 10.0000 meq | EXTENDED_RELEASE_TABLET | Freq: Every day | ORAL | 0 refills | Status: DC

## 2016-07-11 MED ORDER — METOPROLOL SUCCINATE ER 50 MG PO TB24: 100.0000 mg | ORAL_TABLET | Freq: Every day | ORAL | Status: DC

## 2016-07-11 MED ORDER — POTASSIUM CHLORIDE CRYS ER 10 MEQ PO TBCR: 10.0000 meq | EXTENDED_RELEASE_TABLET | Freq: Every day | ORAL | Status: DC

## 2016-07-11 NOTE — Progress Notes (Signed)
Pt being discharged home, discharge instructions and prescriptions reviewed with pt, states understanding, pt with no complaints of pain, no distress or discomfort noted

## 2016-07-11 NOTE — Care Management (Signed)
Admitted to [facility] with the diagnosis of hypokalemia under observation status. Lives with wife, Kennyth Lose [phone]) and lives in [place]. Seen Dr. Manuella Ghazi last month, next appointment is scheduled for October 5th. Takes care of all basic and instrumental activities of daily living himself, drives and works in [place]. Prescriptions are filled at [facility] in [place]. Good appetite. No falls. Wife will transport. Shelbie Ammons RN MSN CCM Care Management [phone]

## 2016-07-11 NOTE — Discharge Summary (Signed)
Discharge summary [facility] [place] NAME: Curtis Marquez    MR#:  [id number]  DATE OF BIRTH:  January 01, 1946  DATE OF ADMISSION:  07/10/2016 ADMITTING PHYSICIAN: Loletha Grayer, MD  DATE OF DISCHARGE: 07/11/2016 12:20 PM  PRIMARY CARE PHYSICIAN: Minta Balsam, MD    ADMISSION DIAGNOSIS:  Near syncope [R55]  DISCHARGE DIAGNOSIS:  Active Problems:   Hypokalemia   SECONDARY DIAGNOSIS:   Past Medical History:  Diagnosis Date  . Brain tumor ([place])   . GERD (gastroesophageal reflux disease)   . Hypercholesteremia   . Hypertension   . Prostate cancer ([place]) 11/13/13    HOSPITAL COURSE:   1. Hypokalemia. This was replaced during the hospital course and since he does have lower numbers every time I see a value in the computer I will send ammonia on 10 mEq of [place] daily. 2. Essential hypertension. I increased her Toprol-XL to 100 mg daily. Continue other blood pressure medications. 3. Near syncope. Likely secondary to hypokalemia and weakness. Patient had syncope workup back in April which was negative. Telemetry monitoring unremarkable. 4. History of prostate cancer 5. History of brain tumor   DISCHARGE CONDITIONS:   Satisfactory  CONSULTS OBTAINED:   none  DRUG ALLERGIES:  No Known Allergies  DISCHARGE MEDICATIONS:   Discharge Medication List as of 07/11/2016 10:21 AM    START taking these medications   Details  metoprolol succinate (TOPROL-XL) 100 MG 24 hr tablet Take 1 tablet (100 mg total) by mouth daily. Take with or immediately following a meal., Starting Tue 07/11/2016, Print    potassium chloride (K-DUR,KLOR-CON) 10 MEQ tablet Take 1 tablet (10 mEq total) by mouth daily., Starting Wed 07/12/2016, Print      CONTINUE these medications which have NOT CHANGED   Details  amLODipine (NORVASC) 10 MG tablet Take 10 mg by mouth every evening. , Historical Med    losartan (COZAAR) 25 MG tablet Take 25 mg by mouth every evening. ,  Historical Med    ranitidine (ZANTAC) 150 MG tablet Take 150 mg by mouth daily. , Historical Med      STOP taking these medications     Loratadine-Pseudoephedrine (CLARITIN-D 12 HOUR PO)      metoprolol (LOPRESSOR) 50 MG tablet          DISCHARGE INSTRUCTIONS:   Follow-up PMD one week  If you experience worsening of your admission symptoms, develop shortness of breath, life threatening emergency, suicidal or homicidal thoughts you must seek medical attention immediately by calling 911 or calling your MD immediately  if symptoms less severe.  You Must read complete instructions/literature along with all the possible adverse reactions/side effects for all the Medicines you take and that have been prescribed to you. Take any new Medicines after you have completely understood and accept all the possible adverse reactions/side effects.   Please note  You were cared for by a hospitalist during your hospital stay. If you have any questions about your discharge medications or the care you received while you were in the hospital after you are discharged, you can call the unit and asked to speak with the hospitalist on call if the hospitalist that took care of you is not available. Once you are discharged, your primary care physician will handle any further medical issues. Please note that NO REFILLS for any discharge medications will be authorized once you are discharged, as it is imperative that you return to your primary care physician (or establish a relationship  with a primary care physician if you do not have one) for your aftercare needs so that they can reassess your need for medications and monitor your lab values.    Today   CHIEF COMPLAINT:   Chief Complaint  Patient presents with  . Emesis    HISTORY OF PRESENT ILLNESS:  Curtis Marquez  is a 70 y.o. male with a known history of Presented with near syncope sweating and feeling weak   VITAL SIGNS:  Blood pressure (!) 154/82,  pulse 85, temperature 98 F (36.7 C), temperature source Oral, resp. rate 18, height 5' 8"  (1.727 m), weight 84.5 kg (186 lb 4.8 oz), SpO2 100 %.    PHYSICAL EXAMINATION:  GENERAL:  70 y.o.-year-old patient lying in the bed with no acute distress.  EYES: Pupils equal, round, reactive to light and accommodation. No scleral icterus. Extraocular muscles intact.  HEENT: Head atraumatic, normocephalic. Oropharynx and nasopharynx clear.  NECK:  Supple, no jugular venous distention. No thyroid enlargement, no tenderness.  LUNGS: Normal breath sounds bilaterally, no wheezing, rales,rhonchi or crepitation. No use of accessory muscles of respiration.  CARDIOVASCULAR: S1, S2 normal. No murmurs, rubs, or gallops.  ABDOMEN: Soft, non-tender, non-distended. Bowel sounds present. No organomegaly or mass.  EXTREMITIES: No pedal edema, cyanosis, or clubbing.  NEUROLOGIC: Cranial nerves II through XII are intact. Muscle strength 5/5 in all extremities. Sensation intact. Gait not checked.  PSYCHIATRIC: The patient is alert and oriented x 3.  SKIN: No obvious rash, lesion, or ulcer.   DATA REVIEW:   CBC  Recent Labs Lab 07/11/16 0448  WBC 7.8  HGB 13.9  HCT 39.6*  PLT 288    Chemistries   Recent Labs Lab 07/10/16 0619 07/10/16 1110 07/11/16 0448  NA 140  --  141  K 2.8*  --  3.6  CL 104  --  110  CO2 26  --  25  GLUCOSE 139*  --  155*  BUN 13  --  15  CREATININE 1.18  --  0.96  CALCIUM 9.0  --  8.7*  MG  --  2.1  --   AST 25  --   --   ALT 24  --   --   ALKPHOS 92  --   --   BILITOT 0.7  --   --     Cardiac Enzymes  Recent Labs Lab 07/10/16 1110  TROPONINI <0.03    Microbiology Results  Results for orders placed or performed during the hospital encounter of 02/09/16  MRSA PCR Screening     Status: None   Collection Time: 02/09/16 11:08 PM  Result Value Ref Range Status   MRSA by PCR NEGATIVE NEGATIVE Final    Comment:        The GeneXpert MRSA Assay (FDA approved for  NASAL specimens only), is one component of a comprehensive MRSA colonization surveillance program. It is not intended to diagnose MRSA infection nor to guide or monitor treatment for MRSA infections.     RADIOLOGY:  Dg Chest 2 View  Result Date: 07/10/2016 CLINICAL DATA:  70 year old male with vomiting, diaphoresis and near syncope this morning. Initial encounter. EXAM: CHEST  2 VIEW COMPARISON:  [facility] chest radiographs 02/09/2016. [facility] portable chest 09/29/2015, and earlier. FINDINGS: Chronically low lung volumes. Stable cardiac size at the upper limits of normal to mildly enlarged. Other mediastinal contours are within normal limits. Visualized tracheal air column is within normal limits. No pneumothorax, pulmonary edema, pleural effusion or  confluent pulmonary opacity. Crowding of markings at both lung bases. No acute osseous abnormality identified. IMPRESSION: Chronically low lung volumes.  No acute cardiopulmonary abnormality. Electronically Signed   By: Genevie Ann M.D.   On: 07/10/2016 07:34    Management plans discussed with the patient, family and they are in agreement.  CODE STATUS:  Code Status History    Date Active Date Inactive Code Status Order ID Comments User Context   07/10/2016  7:58 AM 07/11/2016  3:50 PM Full Code [id number]  Loletha Grayer, MD ED   02/09/2016 10:06 PM 02/12/2016  3:05 PM Full Code [id number]  Vianne Bulls, MD ED   02/09/2016 10:00 PM 02/09/2016 10:06 PM Full Code [id number]  Vianne Bulls, MD ED      TOTAL TIME TAKING CARE OF THIS PATIENT: 35 minutes.    Loletha Grayer M.D on 07/11/2016 at 5:01 PM  Between 7am to 6pm - Pager - [phone]  After 6pm go to [email or web address] - password [email or web address]  [facility] Office  [phone]  CC: Primary care physician; Minta Balsam, MD

## 2016-07-11 NOTE — Care Management Obs Status (Signed)
[facility] NOTIFICATION   Patient Details  Name: Curtis Marquez MRN: [id number] Date of Birth: Jun 23, 1946   Medicare Observation Status Notification Given:  Yes    Shelbie Ammons, RN 07/11/2016, 9:23 AM

## 2016-07-11 NOTE — Discharge Instructions (Signed)

## 2016-07-13 LAB — ALDOSTERONE + RENIN ACTIVITY W/ RATIO
ALDO / PRA RATIO: 11.5 (ref 0.0–30.0)
ALDOSTERONE: 5.4 ng/dL (ref 0.0–30.0)
PRA LC/MS/MS: 0.471 ng/mL/h (ref 0.167–5.380)

## 2016-07-27 DIAGNOSIS — I10 Essential (primary) hypertension: Secondary | ICD-10-CM | POA: Diagnosis not present

## 2016-07-27 DIAGNOSIS — E876 Hypokalemia: Secondary | ICD-10-CM | POA: Diagnosis not present

## 2016-07-27 DIAGNOSIS — Z713 Dietary counseling and surveillance: Secondary | ICD-10-CM | POA: Diagnosis not present

## 2016-10-02 DIAGNOSIS — Z713 Dietary counseling and surveillance: Secondary | ICD-10-CM | POA: Diagnosis not present

## 2016-10-02 DIAGNOSIS — Z299 Encounter for prophylactic measures, unspecified: Secondary | ICD-10-CM | POA: Diagnosis not present

## 2016-10-02 DIAGNOSIS — I10 Essential (primary) hypertension: Secondary | ICD-10-CM | POA: Diagnosis not present

## 2016-10-02 DIAGNOSIS — R7309 Other abnormal glucose: Secondary | ICD-10-CM | POA: Diagnosis not present

## 2016-10-02 DIAGNOSIS — Z6828 Body mass index (BMI) 28.0-28.9, adult: Secondary | ICD-10-CM | POA: Diagnosis not present

## 2016-11-22 DIAGNOSIS — C61 Malignant neoplasm of prostate: Secondary | ICD-10-CM | POA: Diagnosis not present

## 2016-11-29 DIAGNOSIS — N5201 Erectile dysfunction due to arterial insufficiency: Secondary | ICD-10-CM | POA: Diagnosis not present

## 2016-11-29 DIAGNOSIS — C61 Malignant neoplasm of prostate: Secondary | ICD-10-CM | POA: Diagnosis not present

## 2017-01-08 DIAGNOSIS — Z713 Dietary counseling and surveillance: Secondary | ICD-10-CM | POA: Diagnosis not present

## 2017-01-08 DIAGNOSIS — Z6828 Body mass index (BMI) 28.0-28.9, adult: Secondary | ICD-10-CM | POA: Diagnosis not present

## 2017-01-08 DIAGNOSIS — Z299 Encounter for prophylactic measures, unspecified: Secondary | ICD-10-CM | POA: Diagnosis not present

## 2017-01-08 DIAGNOSIS — Z789 Other specified health status: Secondary | ICD-10-CM | POA: Diagnosis not present

## 2017-01-08 DIAGNOSIS — E7800 Pure hypercholesterolemia, unspecified: Secondary | ICD-10-CM | POA: Diagnosis not present

## 2017-01-08 DIAGNOSIS — I10 Essential (primary) hypertension: Secondary | ICD-10-CM | POA: Diagnosis not present

## 2017-02-08 DIAGNOSIS — R0981 Nasal congestion: Secondary | ICD-10-CM | POA: Diagnosis not present

## 2017-02-08 DIAGNOSIS — E7800 Pure hypercholesterolemia, unspecified: Secondary | ICD-10-CM | POA: Diagnosis not present

## 2017-02-08 DIAGNOSIS — K219 Gastro-esophageal reflux disease without esophagitis: Secondary | ICD-10-CM | POA: Diagnosis not present

## 2017-02-08 DIAGNOSIS — Z789 Other specified health status: Secondary | ICD-10-CM | POA: Diagnosis not present

## 2017-02-08 DIAGNOSIS — R946 Abnormal results of thyroid function studies: Secondary | ICD-10-CM | POA: Diagnosis not present

## 2017-02-08 DIAGNOSIS — C61 Malignant neoplasm of prostate: Secondary | ICD-10-CM | POA: Diagnosis not present

## 2017-02-08 DIAGNOSIS — Z713 Dietary counseling and surveillance: Secondary | ICD-10-CM | POA: Diagnosis not present

## 2017-02-08 DIAGNOSIS — Z299 Encounter for prophylactic measures, unspecified: Secondary | ICD-10-CM | POA: Diagnosis not present

## 2017-02-08 DIAGNOSIS — I10 Essential (primary) hypertension: Secondary | ICD-10-CM | POA: Diagnosis not present

## 2017-02-08 DIAGNOSIS — Z6827 Body mass index (BMI) 27.0-27.9, adult: Secondary | ICD-10-CM | POA: Diagnosis not present

## 2017-03-15 DIAGNOSIS — Z6828 Body mass index (BMI) 28.0-28.9, adult: Secondary | ICD-10-CM | POA: Diagnosis not present

## 2017-03-15 DIAGNOSIS — R946 Abnormal results of thyroid function studies: Secondary | ICD-10-CM | POA: Diagnosis not present

## 2017-03-15 DIAGNOSIS — I10 Essential (primary) hypertension: Secondary | ICD-10-CM | POA: Diagnosis not present

## 2017-03-15 DIAGNOSIS — K219 Gastro-esophageal reflux disease without esophagitis: Secondary | ICD-10-CM | POA: Diagnosis not present

## 2017-03-15 DIAGNOSIS — E7800 Pure hypercholesterolemia, unspecified: Secondary | ICD-10-CM | POA: Diagnosis not present

## 2017-03-15 DIAGNOSIS — Z299 Encounter for prophylactic measures, unspecified: Secondary | ICD-10-CM | POA: Diagnosis not present

## 2017-03-15 DIAGNOSIS — C61 Malignant neoplasm of prostate: Secondary | ICD-10-CM | POA: Diagnosis not present

## 2017-05-17 DIAGNOSIS — Z79899 Other long term (current) drug therapy: Secondary | ICD-10-CM | POA: Diagnosis not present

## 2017-05-17 DIAGNOSIS — Z6828 Body mass index (BMI) 28.0-28.9, adult: Secondary | ICD-10-CM | POA: Diagnosis not present

## 2017-05-17 DIAGNOSIS — Z7189 Other specified counseling: Secondary | ICD-10-CM | POA: Diagnosis not present

## 2017-05-17 DIAGNOSIS — I10 Essential (primary) hypertension: Secondary | ICD-10-CM | POA: Diagnosis not present

## 2017-05-17 DIAGNOSIS — Z0000 Encounter for general adult medical examination without abnormal findings: Secondary | ICD-10-CM | POA: Diagnosis not present

## 2017-05-17 DIAGNOSIS — Z299 Encounter for prophylactic measures, unspecified: Secondary | ICD-10-CM | POA: Diagnosis not present

## 2017-05-17 DIAGNOSIS — E7800 Pure hypercholesterolemia, unspecified: Secondary | ICD-10-CM | POA: Diagnosis not present

## 2017-05-17 DIAGNOSIS — Z1211 Encounter for screening for malignant neoplasm of colon: Secondary | ICD-10-CM | POA: Diagnosis not present

## 2017-05-17 DIAGNOSIS — Z1389 Encounter for screening for other disorder: Secondary | ICD-10-CM | POA: Diagnosis not present

## 2017-05-17 DIAGNOSIS — Z125 Encounter for screening for malignant neoplasm of prostate: Secondary | ICD-10-CM | POA: Diagnosis not present

## 2017-05-17 DIAGNOSIS — R5383 Other fatigue: Secondary | ICD-10-CM | POA: Diagnosis not present

## 2017-07-06 DIAGNOSIS — C61 Malignant neoplasm of prostate: Secondary | ICD-10-CM | POA: Diagnosis not present

## 2017-07-06 DIAGNOSIS — N5201 Erectile dysfunction due to arterial insufficiency: Secondary | ICD-10-CM | POA: Diagnosis not present

## 2017-09-17 DIAGNOSIS — Z2821 Immunization not carried out because of patient refusal: Secondary | ICD-10-CM | POA: Diagnosis not present

## 2017-09-17 DIAGNOSIS — J069 Acute upper respiratory infection, unspecified: Secondary | ICD-10-CM | POA: Diagnosis not present

## 2017-09-17 DIAGNOSIS — Z299 Encounter for prophylactic measures, unspecified: Secondary | ICD-10-CM | POA: Diagnosis not present

## 2017-09-17 DIAGNOSIS — R3915 Urgency of urination: Secondary | ICD-10-CM | POA: Diagnosis not present

## 2017-09-17 DIAGNOSIS — I10 Essential (primary) hypertension: Secondary | ICD-10-CM | POA: Diagnosis not present

## 2017-09-17 DIAGNOSIS — Z6827 Body mass index (BMI) 27.0-27.9, adult: Secondary | ICD-10-CM | POA: Diagnosis not present

## 2017-11-23 DIAGNOSIS — I10 Essential (primary) hypertension: Secondary | ICD-10-CM | POA: Diagnosis not present

## 2017-11-23 DIAGNOSIS — Z6827 Body mass index (BMI) 27.0-27.9, adult: Secondary | ICD-10-CM | POA: Diagnosis not present

## 2017-11-23 DIAGNOSIS — Z299 Encounter for prophylactic measures, unspecified: Secondary | ICD-10-CM | POA: Diagnosis not present

## 2017-11-23 DIAGNOSIS — Z789 Other specified health status: Secondary | ICD-10-CM | POA: Diagnosis not present

## 2017-11-23 DIAGNOSIS — E7800 Pure hypercholesterolemia, unspecified: Secondary | ICD-10-CM | POA: Diagnosis not present

## 2018-05-20 DIAGNOSIS — Z0000 Encounter for general adult medical examination without abnormal findings: Secondary | ICD-10-CM | POA: Diagnosis not present

## 2018-05-20 DIAGNOSIS — I10 Essential (primary) hypertension: Secondary | ICD-10-CM | POA: Diagnosis not present

## 2018-05-20 DIAGNOSIS — Z1211 Encounter for screening for malignant neoplasm of colon: Secondary | ICD-10-CM | POA: Diagnosis not present

## 2018-05-20 DIAGNOSIS — Z1331 Encounter for screening for depression: Secondary | ICD-10-CM | POA: Diagnosis not present

## 2018-05-20 DIAGNOSIS — Z7189 Other specified counseling: Secondary | ICD-10-CM | POA: Diagnosis not present

## 2018-05-20 DIAGNOSIS — E7800 Pure hypercholesterolemia, unspecified: Secondary | ICD-10-CM | POA: Diagnosis not present

## 2018-05-20 DIAGNOSIS — Z6826 Body mass index (BMI) 26.0-26.9, adult: Secondary | ICD-10-CM | POA: Diagnosis not present

## 2018-05-20 DIAGNOSIS — Z1339 Encounter for screening examination for other mental health and behavioral disorders: Secondary | ICD-10-CM | POA: Diagnosis not present

## 2018-05-20 DIAGNOSIS — Z299 Encounter for prophylactic measures, unspecified: Secondary | ICD-10-CM | POA: Diagnosis not present

## 2018-05-20 DIAGNOSIS — Z125 Encounter for screening for malignant neoplasm of prostate: Secondary | ICD-10-CM | POA: Diagnosis not present

## 2018-05-20 DIAGNOSIS — Z79899 Other long term (current) drug therapy: Secondary | ICD-10-CM | POA: Diagnosis not present

## 2018-05-20 DIAGNOSIS — R5383 Other fatigue: Secondary | ICD-10-CM | POA: Diagnosis not present

## 2018-11-28 ENCOUNTER — Encounter (HOSPITAL_COMMUNITY): Payer: Self-pay | Admitting: Emergency Medicine

## 2018-11-28 ENCOUNTER — Emergency Department (HOSPITAL_COMMUNITY)
Admission: EM | Admit: 2018-11-28 | Discharge: 2018-11-28 | Disposition: A | Discharge status: Home / Self Care | Payer: PPO | Attending: Emergency Medicine | Admitting: Emergency Medicine

## 2018-11-28 ENCOUNTER — Emergency Department (HOSPITAL_COMMUNITY): Payer: PPO

## 2018-11-28 ENCOUNTER — Other Ambulatory Visit: Payer: Self-pay

## 2018-11-28 DIAGNOSIS — I10 Essential (primary) hypertension: Secondary | ICD-10-CM | POA: Insufficient documentation

## 2018-11-28 DIAGNOSIS — J101 Influenza due to other identified influenza virus with other respiratory manifestations: Secondary | ICD-10-CM | POA: Insufficient documentation

## 2018-11-28 DIAGNOSIS — R000 Tachycardia, unspecified: Secondary | ICD-10-CM | POA: Diagnosis not present

## 2018-11-28 DIAGNOSIS — R509 Fever, unspecified: Secondary | ICD-10-CM | POA: Diagnosis not present

## 2018-11-28 DIAGNOSIS — J111 Influenza due to unidentified influenza virus with other respiratory manifestations: Secondary | ICD-10-CM | POA: Diagnosis not present

## 2018-11-28 DIAGNOSIS — Z79899 Other long term (current) drug therapy: Secondary | ICD-10-CM | POA: Diagnosis not present

## 2018-11-28 DIAGNOSIS — Z87891 Personal history of nicotine dependence: Secondary | ICD-10-CM | POA: Insufficient documentation

## 2018-11-28 DIAGNOSIS — R51 Headache: Secondary | ICD-10-CM | POA: Diagnosis present

## 2018-11-28 DIAGNOSIS — Z8546 Personal history of malignant neoplasm of prostate: Secondary | ICD-10-CM | POA: Insufficient documentation

## 2018-11-28 DIAGNOSIS — A419 Sepsis, unspecified organism: Secondary | ICD-10-CM | POA: Diagnosis not present

## 2018-11-28 LAB — CBC WITH DIFFERENTIAL/PLATELET
Abs Immature Granulocytes: 0.04 10*3/uL (ref 0.00–0.07)
Basophils Absolute: 0 10*3/uL (ref 0.0–0.1)
Basophils Relative: 0 %
EOS ABS: 0.1 10*3/uL (ref 0.0–0.5)
Eosinophils Relative: 1 %
HCT: 49.2 % (ref 39.0–52.0)
Hemoglobin: 15.6 g/dL (ref 13.0–17.0)
Immature Granulocytes: 0 %
Lymphocytes Relative: 4 %
Lymphs Abs: 0.5 10*3/uL — ABNORMAL LOW (ref 0.7–4.0)
MCH: 28.3 pg (ref 26.0–34.0)
MCHC: 31.7 g/dL (ref 30.0–36.0)
MCV: 89.3 fL (ref 80.0–100.0)
Monocytes Absolute: 0.8 10*3/uL (ref 0.1–1.0)
Monocytes Relative: 7 %
Neutro Abs: 9.5 10*3/uL — ABNORMAL HIGH (ref 1.7–7.7)
Neutrophils Relative %: 88 %
PLATELETS: 284 10*3/uL (ref 150–400)
RBC: 5.51 MIL/uL (ref 4.22–5.81)
RDW: 14 % (ref 11.5–15.5)
WBC: 11 10*3/uL — AB (ref 4.0–10.5)
nRBC: 0 % (ref 0.0–0.2)

## 2018-11-28 LAB — COMPREHENSIVE METABOLIC PANEL
ALT: 32 U/L (ref 0–44)
ANION GAP: 9 (ref 5–15)
AST: 28 U/L (ref 15–41)
Albumin: 4.1 g/dL (ref 3.5–5.0)
Alkaline Phosphatase: 102 U/L (ref 38–126)
BUN: 14 mg/dL (ref 8–23)
CO2: 28 mmol/L (ref 22–32)
Calcium: 9 mg/dL (ref 8.9–10.3)
Chloride: 104 mmol/L (ref 98–111)
Creatinine, Ser: 1.06 mg/dL (ref 0.61–1.24)
GFR calc Af Amer: >60 mL/min (ref >60)
GFR calc non Af Amer: >60 mL/min (ref >60)
Glucose, Bld: 116 mg/dL — ABNORMAL HIGH (ref 70–99)
Potassium: 3.2 mmol/L — ABNORMAL LOW (ref 3.5–5.1)
Sodium: 141 mmol/L (ref 135–145)
Total Bilirubin: 0.8 mg/dL (ref 0.3–1.2)
Total Protein: 8.1 g/dL (ref 6.5–8.1)

## 2018-11-28 LAB — URINALYSIS, ROUTINE W REFLEX MICROSCOPIC
Bacteria, UA: NONE SEEN
Bilirubin Urine: NEGATIVE
Glucose, UA: NEGATIVE mg/dL
Ketones, ur: NEGATIVE mg/dL
Leukocytes, UA: NEGATIVE
Nitrite: NEGATIVE
Protein, ur: NEGATIVE mg/dL
Specific Gravity, Urine: 1.01 (ref 1.005–1.030)
pH: 6 (ref 5.0–8.0)

## 2018-11-28 LAB — INFLUENZA PANEL BY PCR (TYPE A & B)
Influenza A By PCR: POSITIVE — AB
Influenza B By PCR: NEGATIVE

## 2018-11-28 LAB — LACTIC ACID, PLASMA: Lactic Acid, Venous: 1.3 mmol/L (ref 0.5–1.9)

## 2018-11-28 LAB — MAGNESIUM: Magnesium: 2 mg/dL (ref 1.7–2.4)

## 2018-11-28 MED ORDER — METOPROLOL TARTRATE 5 MG/5ML IV SOLN
5.0000 mg | Freq: Once | INTRAVENOUS | Status: AC
  Administered 2018-11-28: 5 mg via INTRAVENOUS

## 2018-11-28 MED ORDER — IBUPROFEN 400 MG PO TABS
ORAL_TABLET | ORAL | Status: AC
  Filled 2018-11-28: qty 1

## 2018-11-28 MED ORDER — SODIUM CHLORIDE 0.9 % IV BOLUS (SEPSIS)
1000.0000 mL | Freq: Once | INTRAVENOUS | Status: AC
  Administered 2018-11-28: 1000 mL via INTRAVENOUS

## 2018-11-28 MED ORDER — IBUPROFEN 400 MG PO TABS
400.0000 mg | ORAL_TABLET | Freq: Once | ORAL | Status: AC
  Administered 2018-11-28: 400 mg via ORAL

## 2018-11-28 MED ORDER — ACETAMINOPHEN 325 MG PO TABS
650.0000 mg | ORAL_TABLET | Freq: Once | ORAL | Status: AC
  Administered 2018-11-28: 650 mg via ORAL
  Filled 2018-11-28: qty 2

## 2018-11-28 MED ORDER — METOPROLOL TARTRATE 5 MG/5ML IV SOLN
5.0000 mg | Freq: Once | INTRAVENOUS | Status: AC
  Administered 2018-11-28: 5 mg via INTRAVENOUS
  Filled 2018-11-28: qty 5

## 2018-11-28 MED ORDER — ACETAMINOPHEN 325 MG PO TABS
325.0000 mg | ORAL_TABLET | Freq: Once | ORAL | Status: AC
  Administered 2018-11-28: 325 mg via ORAL
  Filled 2018-11-28: qty 1

## 2018-11-28 MED ORDER — POTASSIUM CHLORIDE CRYS ER 20 MEQ PO TBCR
40.0000 meq | EXTENDED_RELEASE_TABLET | Freq: Once | ORAL | Status: AC
  Administered 2018-11-28: 40 meq via ORAL
  Filled 2018-11-28: qty 2

## 2018-11-28 MED ORDER — OSELTAMIVIR PHOSPHATE 75 MG PO CAPS
75.0000 mg | ORAL_CAPSULE | Freq: Once | ORAL | Status: AC
  Administered 2018-11-28: 75 mg via ORAL
  Filled 2018-11-28: qty 1

## 2018-11-28 MED ORDER — METOPROLOL TARTRATE 5 MG/5ML IV SOLN
INTRAVENOUS | Status: AC
  Filled 2018-11-28: qty 5

## 2018-11-28 MED ORDER — SODIUM CHLORIDE 0.9 % IV BOLUS (SEPSIS): 1000.0000 mL | Freq: Once | INTRAVENOUS | Status: DC

## 2018-11-28 MED ORDER — OSELTAMIVIR PHOSPHATE 75 MG PO CAPS: 75.0000 mg | ORAL_CAPSULE | Freq: Two times a day (BID) | ORAL | 0 refills | Status: DC

## 2018-11-28 NOTE — ED Provider Notes (Signed)
[facility] EMERGENCY DEPARTMENT Provider Note   CSN: [id number] Arrival date & time: 11/28/18  1910     History   Chief Complaint Chief Complaint  Patient presents with  . Fever    HPI Curtis Marquez is a 73 y.o. male.  He is brought in by his wife for evaluation for not feeling well.  He said he started not feeling well around noon and she checked his blood pressure tonight and found it was elevated.  He was feeling cold.  She said she noticed he is felt warm for the last couple of days but he has not had any complaints.  No real infectious symptoms.  Mild headache no blurry vision double vision numbness or weakness.  Dry cough.  No chest pain abdominal pain nausea vomiting diarrhea or urinary symptoms.  No rashes or swollen joints.  No recent travel or sick contacts.  Did not get a flu shot this year.  He said his blood pressure is usually high and goes even higher when he goes to the doctor.  It sounds like he inconsistently takes his medicines but did take them this morning.  He was not aware that he had a fever.  The history is provided by the patient and the spouse.  Fever  Max temp prior to arrival:  103.3 Temp source:  Oral Onset quality:  Unable to specify Timing:  Unable to specify Chronicity:  New Relieved by:  None tried Worsened by:  Nothing Ineffective treatments:  None tried Associated symptoms: chills, cough and headaches   Associated symptoms: no chest pain, no confusion, no congestion, no diarrhea, no dysuria, no ear pain, no myalgias, no nausea, no rash, no rhinorrhea, no sore throat and no vomiting   Risk factors: no recent sickness, no recent travel and no sick contacts     Past Medical History:  Diagnosis Date  . Brain tumor ([place])   . GERD (gastroesophageal reflux disease)   . Hypercholesteremia   . Hypertension   . Prostate cancer ([place]) 11/13/13    Patient Active Problem List   Diagnosis Date Noted  . Hypokalemia 07/10/2016  . Hypertensive urgency  02/09/2016  . GERD (gastroesophageal reflux disease) 02/09/2016  . Blunt abdominal trauma 02/09/2016  . Syncope   . Prostate cancer ([place]) 11/13/2013    Past Surgical History:  Procedure Laterality Date  . BRAIN TUMOR EXCISION  1994  . LYMPHADENECTOMY Bilateral 11/13/2013   Procedure: LYMPHADENECTOMY "PELVIC LYMPH NODE DISSECTION";  Surgeon: Dutch Gray, MD;  Location: [facility];  Service: Urology;  Laterality: Bilateral;  . ROBOT ASSISTED LAPAROSCOPIC RADICAL PROSTATECTOMY N/A 11/13/2013   Procedure: ROBOTIC ASSISTED LAPAROSCOPIC RADICAL PROSTATECTOMY LEVEL 2;  Surgeon: Dutch Gray, MD;  Location: [facility];  Service: Urology;  Laterality: N/A;        Home Medications    Prior to Admission medications   Medication Sig Start Date End Date Taking? Authorizing Provider  amLODipine (NORVASC) 10 MG tablet Take 10 mg by mouth every evening.     [provider]  losartan (COZAAR) 25 MG tablet Take 25 mg by mouth every evening.     [provider]  metoprolol succinate (TOPROL-XL) 100 MG 24 hr tablet Take 1 tablet (100 mg total) by mouth daily. Take with or immediately following a meal. 07/11/16   Loletha Grayer, MD  potassium chloride (K-DUR,KLOR-CON) 10 MEQ tablet Take 1 tablet (10 mEq total) by mouth daily. 07/12/16   Loletha Grayer, MD  ranitidine (ZANTAC) 150 MG tablet Take 150  mg by mouth daily.     [provider]    Family History Family History  Problem Relation Age of Onset  . CVA Father   . Diabetes Mother   . Hypertension Mother   . Diabetes Sister   . Hypertension Sister   . Cancer Brother     Social History Social History   Tobacco Use  . Smoking status: Former Research scientist (life sciences)  . Smokeless tobacco: Never Used  Substance Use Topics  . Alcohol use: No    Alcohol/week: 0.0 standard drinks  . Drug use: No     Allergies   Patient has no known allergies.   Review of Systems Review of Systems  Constitutional: Positive for chills and fever.  HENT:  Negative for congestion, ear pain, rhinorrhea and sore throat.   Eyes: Negative for visual disturbance.  Respiratory: Positive for cough. Negative for shortness of breath.   Cardiovascular: Negative for chest pain.  Gastrointestinal: Negative for abdominal pain, diarrhea, nausea and vomiting.  Genitourinary: Negative for dysuria.  Musculoskeletal: Negative for myalgias.  Skin: Negative for rash.  Neurological: Positive for headaches.  Psychiatric/Behavioral: Negative for confusion.     Physical Exam Updated Vital Signs BP (!) 208/114 (BP Location: Left Arm)   Pulse (!) 115   Temp (!) 103.3 F (39.6 C) (Oral)   Resp 18   Ht 5' 8"  (1.727 m)   Wt 83.9 kg   SpO2 94%   BMI 28.13 kg/m   Physical Exam Vitals signs and nursing note reviewed.  Constitutional:      Appearance: He is well-developed.  HENT:     Head: Normocephalic and atraumatic.  Eyes:     Conjunctiva/sclera: Conjunctivae normal.  Neck:     Musculoskeletal: Neck supple.  Cardiovascular:     Rate and Rhythm: Regular rhythm. Tachycardia present.     Pulses: Normal pulses.     Heart sounds: No murmur.  Pulmonary:     Effort: Pulmonary effort is normal. No respiratory distress.     Breath sounds: Normal breath sounds.  Abdominal:     Palpations: Abdomen is soft.     Tenderness: There is no abdominal tenderness. There is no guarding or rebound.  Musculoskeletal: Normal range of motion.        General: No tenderness.     Right lower leg: No edema.     Left lower leg: No edema.  Skin:    General: Skin is warm and dry.     Capillary Refill: Capillary refill takes less than 2 seconds.  Neurological:     General: No focal deficit present.     Mental Status: He is alert and oriented to person, place, and time.     Cranial Nerves: No cranial nerve deficit.     Sensory: No sensory deficit.     Motor: No weakness.      ED Treatments / Results  Labs (all labs ordered are listed, but only abnormal results are  displayed) Labs Reviewed  COMPREHENSIVE METABOLIC PANEL - Abnormal; Notable for the following components:      Result Value   Potassium 3.2 (*)    Glucose, Bld 116 (*)    All other components within normal limits  CBC WITH DIFFERENTIAL/PLATELET - Abnormal; Notable for the following components:   WBC 11.0 (*)    Neutro Abs 9.5 (*)    Lymphs Abs 0.5 (*)    All other components within normal limits  URINALYSIS, ROUTINE W REFLEX MICROSCOPIC - Abnormal; Notable for  the following components:   Color, Urine STRAW (*)    Hgb urine dipstick SMALL (*)    All other components within normal limits  INFLUENZA PANEL BY PCR (TYPE A & B) - Abnormal; Notable for the following components:   Influenza A By PCR POSITIVE (*)    All other components within normal limits  CULTURE, BLOOD (ROUTINE X 2)  CULTURE, BLOOD (ROUTINE X 2)  LACTIC ACID, PLASMA  MAGNESIUM    EKG EKG Interpretation  Date/Time:  Thursday November 28 2018 19:38:08 EST Ventricular Rate:  123 PR Interval:    QRS Duration: 85 QT Interval:  368 QTC Calculation: 527 R Axis:   88 Text Interpretation:  Sinus tachycardia Left atrial enlargement Borderline right axis deviation Nonspecific T abnormalities, lateral leads Prolonged QT interval Baseline wander in lead(s) II III aVL aVF increased rate and QTc compared with prior 9/17 Confirmed by Aletta Edouard [id number]) on 11/28/2018 7:40:04 PM   Radiology Dg Chest Port 1 View  Result Date: 11/28/2018 CLINICAL DATA:  Code sepsis, fevers, weakness EXAM: PORTABLE CHEST 1 VIEW COMPARISON:  None. FINDINGS: The heart size and mediastinal contours are within normal limits. Both lungs are clear. The visualized skeletal structures are unremarkable. IMPRESSION: No active disease. Electronically Signed   By: Kathreen Devoid   On: 11/28/2018 19:45    Procedures Procedures (including critical care time)  Medications Ordered in ED Medications  sodium chloride 0.9 % bolus 1,000 mL (has no administration  in time range)    And  sodium chloride 0.9 % bolus 1,000 mL (has no administration in time range)    And  sodium chloride 0.9 % bolus 1,000 mL (has no administration in time range)  acetaminophen (TYLENOL) tablet 650 mg (has no administration in time range)     Initial Impression / Assessment and Plan / ED Course  I have reviewed the triage vital signs and the nursing notes.  Pertinent labs & imaging results that were available during my care of the patient were reviewed by me and considered in my medical decision making (see chart for details).  Clinical Course as of Nov 28 2317  Thu Nov 28, 2018  2008 Initially  activated code sepsis as he is a sepsis candidate with fever and tachycardia.  His lactic acid is come back normal at 1.3. Discontinued code sepsis but continue to search for a source.  Chest x-ray was read as negative.  Ultimately he has no symptoms and only came here because his blood pressure was elevated.   [MB]  2021 Still febrile after Tylenol and blood pressure has not improved much.  Ordered some ibuprofen and some IV Lopressor.   [MB]  2031 Patient came back as flu a positive.  Of updated him and ordered some Tamiflu.   [MB]  2135 I have reviewed the patient's prior blood pressures when he has been here.  They are usually quite elevated kind of in this range.  His temp still has not broken.  Have ordered another dose of Tylenol.   [MB]  2221 Patient's fevers come down a little bit more and his tachycardia has resolved.  He still quite hypertensive but am not really sure that he takes his medications regularly.  He is otherwise nontoxic-appearing so I think he can be safely discharged and sent home with a prescription for some Tamiflu.  He understands to drink plenty of fluids and to keep his fever under control.  He knows to follow-up with his doctor and return  if any worsening symptoms.   [MB]    Clinical Course User Index [MB] Hayden Rasmussen, MD     Final  Clinical Impressions(s) / ED Diagnoses   Final diagnoses:  Influenza  Hypertension, unspecified type    ED Discharge Orders         Ordered    oseltamivir (TAMIFLU) 75 MG capsule  Every 12 hours     11/28/18 2223           Hayden Rasmussen, MD 11/28/18 2320

## 2018-11-28 NOTE — ED Triage Notes (Signed)
Pt reports hypertension and weakness since noon today.

## 2018-11-28 NOTE — Discharge Instructions (Addendum)
You were evaluated in the emergency department for elevated blood pressure and were found to be having a high fever.  You test ultimately indicated that you had influenza.  We are starting you on some viral medication for that and you will need to keep your fevers under control with Tylenol and ibuprofen.  Will be important to stay well-hydrated.  Please continue to take your blood pressure on a regular basis.  Follow-up with your doctor and return if any worsening symptoms.

## 2018-11-28 NOTE — ED Notes (Signed)
Pt ambulatory to waiting room. Pt verbalized understanding of discharge instructions.   

## 2018-12-02 DIAGNOSIS — Z87891 Personal history of nicotine dependence: Secondary | ICD-10-CM | POA: Diagnosis not present

## 2018-12-02 DIAGNOSIS — Z299 Encounter for prophylactic measures, unspecified: Secondary | ICD-10-CM | POA: Diagnosis not present

## 2018-12-02 DIAGNOSIS — J111 Influenza due to unidentified influenza virus with other respiratory manifestations: Secondary | ICD-10-CM | POA: Diagnosis not present

## 2018-12-02 DIAGNOSIS — I10 Essential (primary) hypertension: Secondary | ICD-10-CM | POA: Diagnosis not present

## 2018-12-02 DIAGNOSIS — Z6827 Body mass index (BMI) 27.0-27.9, adult: Secondary | ICD-10-CM | POA: Diagnosis not present

## 2018-12-03 LAB — CULTURE, BLOOD (ROUTINE X 2)
Culture: NO GROWTH
Culture: NO GROWTH
Special Requests: ADEQUATE
Special Requests: ADEQUATE

## 2018-12-25 DIAGNOSIS — Z299 Encounter for prophylactic measures, unspecified: Secondary | ICD-10-CM | POA: Diagnosis not present

## 2018-12-25 DIAGNOSIS — Z6827 Body mass index (BMI) 27.0-27.9, adult: Secondary | ICD-10-CM | POA: Diagnosis not present

## 2018-12-25 DIAGNOSIS — K130 Diseases of lips: Secondary | ICD-10-CM | POA: Diagnosis not present

## 2018-12-25 DIAGNOSIS — I10 Essential (primary) hypertension: Secondary | ICD-10-CM | POA: Diagnosis not present

## 2019-01-28 DIAGNOSIS — E785 Hyperlipidemia, unspecified: Secondary | ICD-10-CM | POA: Diagnosis not present

## 2019-01-28 DIAGNOSIS — Z299 Encounter for prophylactic measures, unspecified: Secondary | ICD-10-CM | POA: Diagnosis not present

## 2019-01-28 DIAGNOSIS — I10 Essential (primary) hypertension: Secondary | ICD-10-CM | POA: Diagnosis not present

## 2019-01-28 DIAGNOSIS — Z6827 Body mass index (BMI) 27.0-27.9, adult: Secondary | ICD-10-CM | POA: Diagnosis not present

## 2019-01-28 DIAGNOSIS — I714 Abdominal aortic aneurysm, without rupture: Secondary | ICD-10-CM | POA: Diagnosis not present

## 2019-01-28 DIAGNOSIS — N430 Encysted hydrocele: Secondary | ICD-10-CM | POA: Diagnosis not present

## 2019-01-28 DIAGNOSIS — F331 Major depressive disorder, recurrent, moderate: Secondary | ICD-10-CM | POA: Diagnosis not present

## 2019-01-28 DIAGNOSIS — E871 Hypo-osmolality and hyponatremia: Secondary | ICD-10-CM | POA: Diagnosis not present

## 2019-01-28 DIAGNOSIS — G4700 Insomnia, unspecified: Secondary | ICD-10-CM | POA: Diagnosis not present

## 2019-01-28 DIAGNOSIS — G2581 Restless legs syndrome: Secondary | ICD-10-CM | POA: Diagnosis not present

## 2019-01-28 DIAGNOSIS — Z713 Dietary counseling and surveillance: Secondary | ICD-10-CM | POA: Diagnosis not present

## 2019-01-29 DIAGNOSIS — M25552 Pain in left hip: Secondary | ICD-10-CM | POA: Diagnosis not present

## 2019-01-29 DIAGNOSIS — H524 Presbyopia: Secondary | ICD-10-CM | POA: Diagnosis not present

## 2019-01-29 DIAGNOSIS — Z299 Encounter for prophylactic measures, unspecified: Secondary | ICD-10-CM | POA: Diagnosis not present

## 2019-01-29 DIAGNOSIS — H5201 Hypermetropia, right eye: Secondary | ICD-10-CM | POA: Diagnosis not present

## 2019-01-29 DIAGNOSIS — F331 Major depressive disorder, recurrent, moderate: Secondary | ICD-10-CM | POA: Diagnosis not present

## 2019-01-29 DIAGNOSIS — H52203 Unspecified astigmatism, bilateral: Secondary | ICD-10-CM | POA: Diagnosis not present

## 2019-01-29 DIAGNOSIS — Z6827 Body mass index (BMI) 27.0-27.9, adult: Secondary | ICD-10-CM | POA: Diagnosis not present

## 2019-01-29 DIAGNOSIS — M25551 Pain in right hip: Secondary | ICD-10-CM | POA: Diagnosis not present

## 2019-01-29 DIAGNOSIS — H5212 Myopia, left eye: Secondary | ICD-10-CM | POA: Diagnosis not present

## 2019-01-29 DIAGNOSIS — N184 Chronic kidney disease, stage 4 (severe): Secondary | ICD-10-CM | POA: Diagnosis not present

## 2019-01-29 DIAGNOSIS — M545 Low back pain: Secondary | ICD-10-CM | POA: Diagnosis not present

## 2019-01-29 DIAGNOSIS — Z713 Dietary counseling and surveillance: Secondary | ICD-10-CM | POA: Diagnosis not present

## 2019-01-29 DIAGNOSIS — I10 Essential (primary) hypertension: Secondary | ICD-10-CM | POA: Diagnosis not present

## 2019-02-13 DIAGNOSIS — I10 Essential (primary) hypertension: Secondary | ICD-10-CM | POA: Diagnosis not present

## 2019-02-13 DIAGNOSIS — Z299 Encounter for prophylactic measures, unspecified: Secondary | ICD-10-CM | POA: Diagnosis not present

## 2019-02-13 DIAGNOSIS — Z6827 Body mass index (BMI) 27.0-27.9, adult: Secondary | ICD-10-CM | POA: Diagnosis not present

## 2019-02-20 DIAGNOSIS — I10 Essential (primary) hypertension: Secondary | ICD-10-CM | POA: Diagnosis not present

## 2019-03-18 ENCOUNTER — Encounter (HOSPITAL_COMMUNITY): Payer: Self-pay | Admitting: Emergency Medicine

## 2019-03-18 ENCOUNTER — Emergency Department (HOSPITAL_COMMUNITY)
Admission: EM | Admit: 2019-03-18 | Discharge: 2019-03-19 | Disposition: A | Discharge status: Home / Self Care | Payer: PPO | Attending: Emergency Medicine | Admitting: Emergency Medicine

## 2019-03-18 ENCOUNTER — Ambulatory Visit (HOSPITAL_COMMUNITY): Admit: 2019-03-18 | Payer: PPO | Admitting: Gastroenterology

## 2019-03-18 ENCOUNTER — Encounter (HOSPITAL_COMMUNITY)
Admission: EM | Disposition: A | Discharge status: Home / Self Care | Payer: Self-pay | Source: Home / Self Care | Attending: Emergency Medicine

## 2019-03-18 ENCOUNTER — Telehealth: Payer: Self-pay | Admitting: Gastroenterology

## 2019-03-18 ENCOUNTER — Other Ambulatory Visit: Payer: Self-pay

## 2019-03-18 DIAGNOSIS — K222 Esophageal obstruction: Secondary | ICD-10-CM | POA: Insufficient documentation

## 2019-03-18 DIAGNOSIS — Z87891 Personal history of nicotine dependence: Secondary | ICD-10-CM | POA: Insufficient documentation

## 2019-03-18 DIAGNOSIS — B9681 Helicobacter pylori [H. pylori] as the cause of diseases classified elsewhere: Secondary | ICD-10-CM | POA: Diagnosis not present

## 2019-03-18 DIAGNOSIS — I10 Essential (primary) hypertension: Secondary | ICD-10-CM | POA: Diagnosis not present

## 2019-03-18 DIAGNOSIS — E7800 Pure hypercholesterolemia, unspecified: Secondary | ICD-10-CM | POA: Diagnosis not present

## 2019-03-18 DIAGNOSIS — Z1159 Encounter for screening for other viral diseases: Secondary | ICD-10-CM | POA: Diagnosis not present

## 2019-03-18 DIAGNOSIS — Z03818 Encounter for observation for suspected exposure to other biological agents ruled out: Secondary | ICD-10-CM | POA: Diagnosis not present

## 2019-03-18 DIAGNOSIS — W44F3XA Food entering into or through a natural orifice, initial encounter: Secondary | ICD-10-CM

## 2019-03-18 DIAGNOSIS — X58XXXA Exposure to other specified factors, initial encounter: Secondary | ICD-10-CM | POA: Insufficient documentation

## 2019-03-18 DIAGNOSIS — Z79899 Other long term (current) drug therapy: Secondary | ICD-10-CM | POA: Diagnosis not present

## 2019-03-18 DIAGNOSIS — T18128A Food in esophagus causing other injury, initial encounter: Secondary | ICD-10-CM | POA: Diagnosis not present

## 2019-03-18 DIAGNOSIS — K2950 Unspecified chronic gastritis without bleeding: Secondary | ICD-10-CM | POA: Insufficient documentation

## 2019-03-18 DIAGNOSIS — K219 Gastro-esophageal reflux disease without esophagitis: Secondary | ICD-10-CM | POA: Insufficient documentation

## 2019-03-18 HISTORY — PX: ESOPHAGOGASTRODUODENOSCOPY: SHX5428

## 2019-03-18 LAB — BASIC METABOLIC PANEL
Anion gap: 12 (ref 5–15)
BUN: 13 mg/dL (ref 8–23)
CO2: 27 mmol/L (ref 22–32)
Calcium: 9 mg/dL (ref 8.9–10.3)
Chloride: 105 mmol/L (ref 98–111)
Creatinine, Ser: 1 mg/dL (ref 0.61–1.24)
GFR calc Af Amer: >60 mL/min (ref >60)
GFR calc non Af Amer: >60 mL/min (ref >60)
Glucose, Bld: 124 mg/dL — ABNORMAL HIGH (ref 70–99)
Potassium: 3.1 mmol/L — ABNORMAL LOW (ref 3.5–5.1)
Sodium: 144 mmol/L (ref 135–145)

## 2019-03-18 LAB — CBC WITH DIFFERENTIAL/PLATELET
Abs Immature Granulocytes: 0.03 10*3/uL (ref 0.00–0.07)
Basophils Absolute: 0.1 10*3/uL (ref 0.0–0.1)
Basophils Relative: 1 %
Eosinophils Absolute: 0.4 10*3/uL (ref 0.0–0.5)
Eosinophils Relative: 4 %
HCT: 47.2 % (ref 39.0–52.0)
Hemoglobin: 15.6 g/dL (ref 13.0–17.0)
Immature Granulocytes: 0 %
Lymphocytes Relative: 13 %
Lymphs Abs: 1.4 10*3/uL (ref 0.7–4.0)
MCH: 29.7 pg (ref 26.0–34.0)
MCHC: 33.1 g/dL (ref 30.0–36.0)
MCV: 89.7 fL (ref 80.0–100.0)
Monocytes Absolute: 0.6 10*3/uL (ref 0.1–1.0)
Monocytes Relative: 5 %
Neutro Abs: 8.4 10*3/uL — ABNORMAL HIGH (ref 1.7–7.7)
Neutrophils Relative %: 77 %
Platelets: 271 10*3/uL (ref 150–400)
RBC: 5.26 MIL/uL (ref 4.22–5.81)
RDW: 13.6 % (ref 11.5–15.5)
WBC: 10.8 10*3/uL — ABNORMAL HIGH (ref 4.0–10.5)
nRBC: 0 % (ref 0.0–0.2)

## 2019-03-18 LAB — SARS CORONAVIRUS 2 BY RT PCR (HOSPITAL ORDER, PERFORMED IN ~~LOC~~ HOSPITAL LAB): SARS Coronavirus 2: NEGATIVE

## 2019-03-18 SURGERY — EGD (ESOPHAGOGASTRODUODENOSCOPY)
Anesthesia: Moderate Sedation

## 2019-03-18 MED ORDER — MIDAZOLAM HCL 5 MG/5ML IJ SOLN
INTRAMUSCULAR | Status: DC | PRN
  Administered 2019-03-18: 2 mg via INTRAVENOUS
  Administered 2019-03-18: 1 mg via INTRAVENOUS
  Administered 2019-03-18: 2 mg via INTRAVENOUS

## 2019-03-18 MED ORDER — MEPERIDINE HCL 100 MG/ML IJ SOLN
INTRAMUSCULAR | Status: DC | PRN
  Administered 2019-03-18: 25 mg via INTRAVENOUS
  Administered 2019-03-18: 50 mg via INTRAVENOUS

## 2019-03-18 MED ORDER — LABETALOL HCL 5 MG/ML IV SOLN
10.0000 mg | Freq: Once | INTRAVENOUS | Status: AC
  Administered 2019-03-18: 10 mg via INTRAVENOUS
  Filled 2019-03-18: qty 4

## 2019-03-18 MED ORDER — HYDRALAZINE HCL 20 MG/ML IJ SOLN
INTRAMUSCULAR | Status: DC | PRN
  Administered 2019-03-18 (×2): 10 mg via INTRAVENOUS

## 2019-03-18 MED ORDER — ONDANSETRON HCL 4 MG/2ML IJ SOLN
4.0000 mg | Freq: Once | INTRAMUSCULAR | Status: AC
  Administered 2019-03-18: 4 mg via INTRAVENOUS
  Filled 2019-03-18: qty 2

## 2019-03-18 MED ORDER — HYDRALAZINE HCL 20 MG/ML IJ SOLN
INTRAMUSCULAR | Status: AC
  Filled 2019-03-18: qty 1

## 2019-03-18 MED ORDER — LIDOCAINE VISCOUS HCL 2 % MT SOLN
OROMUCOSAL | Status: AC
  Filled 2019-03-18: qty 15

## 2019-03-18 MED ORDER — OMEPRAZOLE 20 MG PO CPDR: DELAYED_RELEASE_CAPSULE | ORAL | 11 refills | Status: DC

## 2019-03-18 MED ORDER — MIDAZOLAM HCL 5 MG/5ML IJ SOLN
INTRAMUSCULAR | Status: AC
  Filled 2019-03-18: qty 10

## 2019-03-18 MED ORDER — GLUCAGON HCL RDNA (DIAGNOSTIC) 1 MG IJ SOLR
1.0000 mg | Freq: Once | INTRAMUSCULAR | Status: AC
  Administered 2019-03-18: 1 mg via INTRAVENOUS
  Filled 2019-03-18: qty 1

## 2019-03-18 MED ORDER — MEPERIDINE HCL 100 MG/ML IJ SOLN
INTRAMUSCULAR | Status: AC
  Filled 2019-03-18: qty 2

## 2019-03-18 MED ORDER — ONDANSETRON HCL 4 MG/2ML IJ SOLN
INTRAMUSCULAR | Status: DC | PRN
  Administered 2019-03-18: 4 mg via INTRAVENOUS

## 2019-03-18 MED ORDER — LIDOCAINE VISCOUS HCL 2 % MT SOLN
OROMUCOSAL | Status: DC | PRN
  Administered 2019-03-18: 1 via OROMUCOSAL

## 2019-03-18 MED ORDER — ONDANSETRON HCL 4 MG/2ML IJ SOLN
INTRAMUSCULAR | Status: AC
  Filled 2019-03-18: qty 2

## 2019-03-18 NOTE — ED Notes (Addendum)
Pt taken to endo for EGD- paper consent sent with pt

## 2019-03-18 NOTE — ED Provider Notes (Signed)
Emergency Department Provider Note   I have reviewed the triage vital signs and the nursing notes.   HISTORY  Chief Complaint Dysphagia   HPI Curtis Marquez is a 73 y.o. male with PMH of GERD, HLD, HTN, and prostate cancer presents to the emergency department with sensation of a pork chop stuck in his throat.  Patient reports eating this at approximately 5 AM this morning.  He states he felt to get stuck and could no longer swallow any food or liquids.  He states he has been trying to dislodge the pork chop for most of the day.  He is tried drinking water, inducing vomiting, and just waiting it out but does continue.  He is carrying a cup and having to spit into it.  He is not experiencing any shortness of breath.  No other modifying factors.  Past Medical History:  Diagnosis Date  . Brain tumor ([place])   . GERD (gastroesophageal reflux disease)   . Hypercholesteremia   . Hypertension   . Prostate cancer ([place]) 11/13/13    Patient Active Problem List   Diagnosis Date Noted  . Food impaction of esophagus 03/18/2019  . Hypokalemia 07/10/2016  . Hypertensive urgency 02/09/2016  . GERD (gastroesophageal reflux disease) 02/09/2016  . Blunt abdominal trauma 02/09/2016  . Syncope   . Prostate cancer ([place]) 11/13/2013    Past Surgical History:  Procedure Laterality Date  . BRAIN TUMOR EXCISION  1994  . LYMPHADENECTOMY Bilateral 11/13/2013   Procedure: LYMPHADENECTOMY "PELVIC LYMPH NODE DISSECTION";  Surgeon: Dutch Gray, MD;  Location: [facility];  Service: Urology;  Laterality: Bilateral;  . ROBOT ASSISTED LAPAROSCOPIC RADICAL PROSTATECTOMY N/A 11/13/2013   Procedure: ROBOTIC ASSISTED LAPAROSCOPIC RADICAL PROSTATECTOMY LEVEL 2;  Surgeon: Dutch Gray, MD;  Location: [facility];  Service: Urology;  Laterality: N/A;    Allergies Patient has no known allergies.  Family History  Problem Relation Age of Onset  . CVA Father   . Diabetes Mother   . Hypertension Mother   . Diabetes Sister   .  Hypertension Sister   . Cancer Brother     Social History Social History   Tobacco Use  . Smoking status: Former Research scientist (life sciences)  . Smokeless tobacco: Never Used  Substance Use Topics  . Alcohol use: No    Alcohol/week: 0.0 standard drinks  . Drug use: No    Review of Systems  Constitutional: No fever/chills Eyes: No visual changes. ENT: No sore throat. Food stuck in throat.  Cardiovascular: Denies chest pain. Respiratory: Denies shortness of breath. Gastrointestinal: No abdominal pain.  No nausea, no vomiting.  No diarrhea.  No constipation. Genitourinary: Negative for dysuria. Musculoskeletal: Negative for back pain. Skin: Negative for rash. Neurological: Negative for headaches, focal weakness or numbness.  10-point ROS otherwise negative.  ____________________________________________   PHYSICAL EXAM:  VITAL SIGNS: ED Triage Vitals  Enc Vitals Group     BP 03/18/19 1920 (!) 225/122     Pulse Rate 03/18/19 1917 80     Resp 03/18/19 1917 18     Temp 03/18/19 1917 98.6 F (37 C)     Temp Source 03/18/19 1917 Oral     SpO2 03/18/19 1917 100 %     Weight 03/18/19 1918 180 lb (81.6 kg)     Height 03/18/19 1918 5' 8"  (1.727 m)     Pain Score 03/18/19 1918 0   Constitutional: Alert and oriented. Well appearing and in no acute distress. Eyes: Conjunctivae are normal. Head: Atraumatic. Nose: No congestion/rhinnorhea.  Mouth/Throat: Mucous membranes are moist. Neck: No stridor.  Cardiovascular: Normal rate, regular rhythm. Good peripheral circulation. Grossly normal heart sounds.   Respiratory: Normal respiratory effort.  No retractions. Lungs CTAB. Gastrointestinal: No distention.  Musculoskeletal: No gross deformities of extremities. Neurologic:  Normal speech and language.  Skin:  Skin is warm, dry and intact. No rash noted.  ____________________________________________   LABS (all labs ordered are listed, but only abnormal results are displayed)  Labs Reviewed   BASIC METABOLIC PANEL - Abnormal; Notable for the following components:      Result Value   Potassium 3.1 (*)    Glucose, Bld 124 (*)    All other components within normal limits  CBC WITH DIFFERENTIAL/PLATELET - Abnormal; Notable for the following components:   WBC 10.8 (*)    Neutro Abs 8.4 (*)    All other components within normal limits  SARS CORONAVIRUS 2 (HOSPITAL ORDER, [facility])   ____________________________________________   PROCEDURES  Procedure(s) performed:   Procedures  None  ____________________________________________   INITIAL IMPRESSION / ASSESSMENT AND PLAN / ED COURSE  Pertinent labs & imaging results that were available during my care of the patient were reviewed by me and considered in my medical decision making (see chart for details).   Patient presents to the emergency department for evaluation of sensation of food stuck in the throat.  It is been present since 5 AM this morning.  He is spitting into a cup but in no distress.  Breathing evenly.  Oxygen saturations are normal.  Patient has significantly elevated blood pressure which may be due to discomfort in the throat. Has not been able to take home BP meds.   09:20 PM  Spoke with Dr. Oneida Alar regarding patient's vomiting after Glucagon and PO challenge. She will be in to evaluate. Will send COVID testing and give Labetalol for BP mgmt.   Labs reviewed. COVID negative. Patient going to Endo Suite.  ____________________________________________  FINAL CLINICAL IMPRESSION(S) / ED DIAGNOSES  Final diagnoses:  Esophageal obstruction due to food impaction     MEDICATIONS GIVEN DURING THIS VISIT:  Medications  ondansetron (ZOFRAN) injection 4 mg (4 mg Intravenous Given 03/18/19 2032)  glucagon (human recombinant) (GLUCAGEN) injection 1 mg (1 mg Intravenous Given 03/18/19 2033)  labetalol (NORMODYNE) injection 10 mg (10 mg Intravenous Given 03/18/19 2128)    Note:  This  document was prepared using Dragon voice recognition software and may include unintentional dictation errors.  Nanda Quinton, MD Emergency Medicine    Long, [facility], MD 03/18/19 2240

## 2019-03-18 NOTE — ED Notes (Signed)
Consent done

## 2019-03-18 NOTE — ED Triage Notes (Signed)
Pt states he was eating a pork chop this morning and believes a piece is stuck in his throat. States he cannot eat or dink anything now without it "coming back up".

## 2019-03-18 NOTE — Discharge Instructions (Signed)
I REMOVED FOOD FROM YOUR ESOPHAGUS. You have a stricture near the base of your esophagus AND ESOPHAGITIS LIKELY DUE TO UNCONTROLLED REFLUX.  You have mild gastritis. I biopsied your stomach.   DRINK WATER TO KEEP YOUR URINE LIGHT YELLOW.  AVOID REFLUX TRIGGERS. SEE INFO BELOW.  STRICTLY FOLLOW A SOFT MECHNICAL DIET. MEATS SHOULD BE GROUND ONLY. AVOID FRIED FOOD. SEE INFO BELOW.  TO TREAT THE STRICTURE AND CONTROL ACID REFLUX, START OMEPRAZOLE.  TAKE 30 MINUTES PRIOR TO YOUR MEALS TWICE DAILY.   YOUR BIOPSY RESULTS WILL BE BACK IN 5 BUSINESS DAYS.  FOLLOW UP IN 4 MOS.  UPPER ENDOSCOPY AFTER CARE Read the instructions outlined below and refer to this sheet in the next week. These discharge instructions provide you with general information on caring for yourself after you leave the hospital. While your treatment has been planned according to the most current medical practices available, unavoidable complications occasionally occur. If you have any problems or questions after discharge, call DR. Rollan Roger, [phone].  ACTIVITY  You may resume your regular activity, but move at a slower pace for the next 24 hours.   Take frequent rest periods for the next 24 hours.   Walking will help get rid of the air and reduce the bloated feeling in your belly (abdomen).   No driving for 24 hours (because of the medicine (anesthesia) used during the test).   You may shower.   Do not sign any important legal documents or operate any machinery for 24 hours (because of the anesthesia used during the test).    NUTRITION  Drink plenty of fluids.   You may resume your normal diet as instructed by your doctor.   Begin with a light meal and progress to your normal diet. Heavy or fried foods are harder to digest and may make you feel sick to your stomach (nauseated).   Avoid alcoholic beverages for 24 hours or as instructed.    MEDICATIONS  You may resume your normal medications.   WHAT YOU  CAN EXPECT TODAY  Some feelings of bloating in the abdomen.   Passage of more gas than usual.    IF YOU HAD A BIOPSY TAKEN DURING THE UPPER ENDOSCOPY:  Eat a soft diet IF YOU HAVE NAUSEA, BLOATING, ABDOMINAL PAIN, OR VOMITING.    FINDING OUT THE RESULTS OF YOUR TEST Not all test results are available during your visit. DR. Oneida Alar WILL CALL YOU WITHIN 14 DAYS OF YOUR PROCEDUE WITH YOUR RESULTS. Do not assume everything is normal if you have not heard from DR. Brailyn Killion, CALL HER OFFICE AT [phone].  SEEK IMMEDIATE MEDICAL ATTENTION AND CALL THE OFFICE: [phone] IF:  You have more than a spotting of blood in your stool.   Your belly is swollen (abdominal distention).   You are nauseated or vomiting.   You have a temperature over 101F.   You have abdominal pain or discomfort that is severe or gets worse throughout the day.  Gastritis  Gastritis is an inflammation (the body's way of reacting to injury and/or infection) of the stomach. It is often caused by viral or bacterial (germ) infections. It can also be caused BY ASPIRIN, BC/GOODY POWDER'S, (IBUPROFEN) MOTRIN, OR ALEVE (NAPROXEN), chemicals (including alcohol), SPICY FOODS, and medications. This illness may be associated with generalized malaise (feeling tired, not well), UPPER ABDOMINAL STOMACH cramps, and fever. One common bacterial cause of gastritis is an organism known as H. Pylori. This can be treated with antibiotics.   ESOPHAGEAL STRICTURE  Esophageal strictures can be caused by stomach acid backing up into the tube that carries food from the mouth down to the stomach (lower esophagus).  TREATMENT There are a number of  medicines used to treat reflux/stricture, including: Antacids.  Proton-pump inhibitors: OMEPRAZOLE PEPCID OR TAGAMET      Lifestyle and home remedies TO CONTROL REFLUX/HEARTBURN You may eliminate or reduce the frequency of heartburn by making the following lifestyle changes:   Control your  weight. Being overweight is a major risk factor for heartburn and GERD. Excess pounds put pressure on your abdomen, pushing up your stomach and causing acid to back up into your esophagus.    Eat smaller meals. 4 TO 6 MEALS A DAY. This reduces pressure on the lower esophageal sphincter, helping to prevent the valve from opening and acid from washing back into your esophagus.    Loosen your belt. Clothes that fit tightly around your waist put pressure on your abdomen and the lower esophageal sphincter.    Eliminate heartburn triggers. Everyone has specific triggers. Common triggers such as fatty or fried foods, spicy food, tomato sauce, carbonated beverages, alcohol, chocolate, mint, garlic, onion, caffeine and nicotine may make heartburn worse.    Avoid stooping or bending. Tying your shoes is OK. Bending over for longer periods to weed your garden isn't, especially soon after eating.    Don't lie down after a meal. Wait at least three to four hours after eating before going to bed, and don't lie down right after eating.    Alternative medicine  Several home remedies exist for treating GERD, but they provide only temporary relief. They include drinking baking soda (sodium bicarbonate) added to water or drinking other fluids such as baking soda mixed with cream of tartar and water.   Although these liquids create temporary relief by neutralizing, washing away or buffering acids, eventually they aggravate the situation by adding gas and fluid to your stomach, increasing pressure and causing more acid reflux. Further, adding more sodium to your diet may increase your blood pressure and add stress to your heart, and excessive bicarbonate ingestion can alter the acid-base balance in your body.  SOFT MECHANICAL DIET This SOFT MECHANICAL DIET is restricted to:  Foods that are moist, soft-textured, and easy to chew and swallow.   Meats that are ground or are minced no larger than one-quarter  inch pieces. Meats are moist with gravy or sauce added.   Foods that do not include bread or bread-like textures except soft pancakes, well-moistened with syrup or sauce.   Textures with some chewing ability required.   Casseroles without rice.   Cooked vegetables that are less than half an inch in size and easily mashed with a fork. No cooked corn, peas, broccoli, cauliflower, cabbage, Brussels sprouts, asparagus, or other fibrous, non-tender or rubbery cooked vegetables.   Canned fruit except for pineapple. Fruit must be cut into pieces no larger than half an inch in size.   Foods that do not include nuts, seeds, coconut, or sticky textures.

## 2019-03-18 NOTE — H&P (Signed)
Referring Provider: No ref. provider found Primary Care Physician:  Minta Balsam, MD Primary Gastroenterologist:  Barney Drain  Reason for Consultation:  FOOD IMPACTION   Impression: ADMITTED WITH FOOD IMPACTION.  Plan: 1. EGD TO REMOVE FOOD IMPACTION.  DISCUSSED PROCEDURE, BENEFITS, & RISKS: < 1% chance of medication reaction, bleeding, perforation, or ASPIRATION. 2. RETURN FOR EGD/DIL IN THE NEAR FUTURE. 3. NEEDS SOFT MECHANICAL DIET UNTIL HE COMPLETES THE DILATION.      HPI:  PT IN HIS USUAL STATE OF HEALTH UNTITL HE ATE A PORK CHOP THAT GOT STUCK @ 5 AM. HAS BEEN UNABLE TO GET FOOD OR L=PILLS DOWN. FAILED TO RELEASE AFTER GLUCAGON IN THE ED.   PT DENIES FEVER, CHILLS, HEMATOCHEZIA, HEMATEMESIS, nausea, vomiting, melena, diarrhea, CHEST PAIN, SHORTNESS OF BREATH,  CHANGE IN BOWEL IN HABITS, constipation, abdominal pain, problems swallowing, problems with sedation, heartburn or indigestion.  Past Medical History:  Diagnosis Date  . Brain tumor ([place])   . GERD (gastroesophageal reflux disease)   . Hypercholesteremia   . Hypertension   . Prostate cancer ([place]) 11/13/13   Past Surgical History:  Procedure Laterality Date  . BRAIN TUMOR EXCISION  1994  . LYMPHADENECTOMY Bilateral 11/13/2013   Procedure: LYMPHADENECTOMY "PELVIC LYMPH NODE DISSECTION";  Surgeon: Dutch Gray, MD;  Location: [facility];  Service: Urology;  Laterality: Bilateral;  . ROBOT ASSISTED LAPAROSCOPIC RADICAL PROSTATECTOMY N/A 11/13/2013   Procedure: ROBOTIC ASSISTED LAPAROSCOPIC RADICAL PROSTATECTOMY LEVEL 2;  Surgeon: Dutch Gray, MD;  Location: [facility];  Service: Urology;  Laterality: N/A;    Prior to Admission medications   Medication Sig Start Date End Date Taking? Authorizing Provider  buPROPion (WELLBUTRIN SR) 100 MG 12 hr tablet Take 100 mg by mouth daily. 02/04/19  Yes [provider]  clonazePAM (KLONOPIN) 0.5 MG tablet Take 0.5 mg by mouth daily as needed for anxiety.  02/04/19  Yes [provider]  cloNIDine (CATAPRES) 0.1 MG tablet Take 0.1 mg by mouth 2 (two) times daily. 02/13/19  Yes [provider]  hydrALAZINE (APRESOLINE) 50 MG tablet Take 50 mg by mouth 2 (two) times a day.  01/28/19  Yes [provider]  losartan (COZAAR) 100 MG tablet Take 100 mg by mouth daily.    Yes [provider]  olmesartan (BENICAR) 40 MG tablet Take 40 mg by mouth daily. 02/10/19  Yes [provider]  oseltamivir (TAMIFLU) 75 MG capsule Take 1 capsule (75 mg total) by mouth every 12 (twelve) hours. Patient not taking: Reported on 03/18/2019 11/28/18   Hayden Rasmussen, MD    No current facility-administered medications for this encounter.    Current Outpatient Medications  Medication Sig Dispense Refill  . buPROPion (WELLBUTRIN SR) 100 MG 12 hr tablet Take 100 mg by mouth daily.    . clonazePAM (KLONOPIN) 0.5 MG tablet Take 0.5 mg by mouth daily as needed for anxiety.     . cloNIDine (CATAPRES) 0.1 MG tablet Take 0.1 mg by mouth 2 (two) times daily.    . hydrALAZINE (APRESOLINE) 50 MG tablet Take 50 mg by mouth 2 (two) times a day.     . losartan (COZAAR) 100 MG tablet Take 100 mg by mouth daily.     Marland Kitchen olmesartan (BENICAR) 40 MG tablet Take 40 mg by mouth daily.    Marland Kitchen oseltamivir (TAMIFLU) 75 MG capsule Take 1 capsule (75 mg total) by mouth every 12 (twelve) hours. (Patient not taking: Reported on 03/18/2019) 10 capsule 0    Allergies as of  03/18/2019  . (No Known Allergies)    Family History  Problem Relation Age of Onset  . CVA Father   . Diabetes Mother   . Hypertension Mother   . Diabetes Sister   . Hypertension Sister   . Cancer Brother      Social History   Socioeconomic History  . Marital status: Married    Spouse name: Not on file  . Number of children: Not on file  . Years of education: Not on file  . Highest education level: Not on file  Occupational History  . Not on file  Social Needs  . Financial resource strain: Not on  file  . Food insecurity:    Worry: Not on file    Inability: Not on file  . Transportation needs:    Medical: Not on file    Non-medical: Not on file  Tobacco Use  . Smoking status: Former Research scientist (life sciences)  . Smokeless tobacco: Never Used  Substance and Sexual Activity  . Alcohol use: No    Alcohol/week: 0.0 standard drinks  . Drug use: No  . Sexual activity: Not on file  Lifestyle  . Physical activity:    Days per week: Not on file    Minutes per session: Not on file  . Stress: Not on file  Relationships  . Social connections:    Talks on phone: Not on file    Gets together: Not on file    Attends religious service: Not on file    Active member of club or organization: Not on file    Attends meetings of clubs or organizations: Not on file    Relationship status: Not on file  . Intimate partner violence:    Fear of current or ex partner: Not on file    Emotionally abused: Not on file    Physically abused: Not on file    Forced sexual activity: Not on file  Other Topics Concern  . Not on file  Social History Narrative  . Not on file    Review of Systems: PER HPI OTHERWISE ALL SYSTEMS ARE NEGATIVE.   Vitals: Blood pressure (!) 181/100, pulse 75, temperature 98.6 F (37 C), temperature source Oral, resp. rate 17, height 5\' 8"  (1.727 m), weight 81.6 kg, SpO2 97 %.  Physical Exam: General:   Alert,  Well-developed, well-nourished, pleasant and cooperative in NAD Head:  Normocephalic and atraumatic. Eyes:  Sclera clear, no icterus.   Conjunctiva pink. Mouth:  No lesions, dentition normal. Neck:  Supple; no masses. Lungs:  Clear throughout to auscultation.   No wheezes. No acute distress. Heart:  Regular rate and rhythm; no murmurs, clicks, rubs,  or gallops. Abdomen:  Soft, nontender and nondistended. No masses, hepatosplenomegaly or hernias noted. Normal bowel sounds, without guarding, and without rebound.   Msk:  Symmetrical without gross deformities. Normal  posture. Extremities:  Without edema. Neurologic:  Alert and  oriented x4;  grossly normal neurologically. Cervical Nodes:  No significant cervical adenopathy. Psych:  Alert and cooperative. Normal mood and affect.   Lab Results: Recent Labs    03/18/19 2138  WBC 10.8*  HGB 15.6  HCT 47.2  PLT 271   BMET Recent Labs    03/18/19 2138  NA 144  K 3.1*  CL 105  CO2 27  GLUCOSE 124*  BUN 13  CREATININE 1.00  CALCIUM 9.0    Studies/Results: NONE   LOS: 0 days   [employer]  03/18/2019, 10:43 PM

## 2019-03-18 NOTE — H&P (View-Only) (Signed)
Referring Provider: No ref. provider found Primary Care Physician:  Minta Balsam, MD Primary Gastroenterologist:  Barney Drain  Reason for Consultation:  FOOD IMPACTION   Impression: ADMITTED WITH FOOD IMPACTION.  Plan: 1. EGD TO REMOVE FOOD IMPACTION.  DISCUSSED PROCEDURE, BENEFITS, & RISKS: < 1% chance of medication reaction, bleeding, perforation, or ASPIRATION. 2. RETURN FOR EGD/DIL IN THE NEAR FUTURE. 3. NEEDS SOFT MECHANICAL DIET UNTIL HE COMPLETES THE DILATION.      HPI:  PT IN HIS USUAL STATE OF HEALTH UNTITL HE ATE A PORK CHOP THAT GOT STUCK @ 5 AM. HAS BEEN UNABLE TO GET FOOD OR L=PILLS DOWN. FAILED TO RELEASE AFTER GLUCAGON IN THE ED.   PT DENIES FEVER, CHILLS, HEMATOCHEZIA, HEMATEMESIS, nausea, vomiting, melena, diarrhea, CHEST PAIN, SHORTNESS OF BREATH,  CHANGE IN BOWEL IN HABITS, constipation, abdominal pain, problems swallowing, problems with sedation, heartburn or indigestion.  Past Medical History:  Diagnosis Date  . Brain tumor ([place])   . GERD (gastroesophageal reflux disease)   . Hypercholesteremia   . Hypertension   . Prostate cancer ([place]) 11/13/13   Past Surgical History:  Procedure Laterality Date  . BRAIN TUMOR EXCISION  1994  . LYMPHADENECTOMY Bilateral 11/13/2013   Procedure: LYMPHADENECTOMY "PELVIC LYMPH NODE DISSECTION";  Surgeon: Dutch Gray, MD;  Location: [facility];  Service: Urology;  Laterality: Bilateral;  . ROBOT ASSISTED LAPAROSCOPIC RADICAL PROSTATECTOMY N/A 11/13/2013   Procedure: ROBOTIC ASSISTED LAPAROSCOPIC RADICAL PROSTATECTOMY LEVEL 2;  Surgeon: Dutch Gray, MD;  Location: [facility];  Service: Urology;  Laterality: N/A;    Prior to Admission medications   Medication Sig Start Date End Date Taking? Authorizing Provider  buPROPion (WELLBUTRIN SR) 100 MG 12 hr tablet Take 100 mg by mouth daily. 02/04/19  Yes [provider]  clonazePAM (KLONOPIN) 0.5 MG tablet Take 0.5 mg by mouth daily as needed for anxiety.  02/04/19  Yes [provider]  cloNIDine (CATAPRES) 0.1 MG tablet Take 0.1 mg by mouth 2 (two) times daily. 02/13/19  Yes [provider]  hydrALAZINE (APRESOLINE) 50 MG tablet Take 50 mg by mouth 2 (two) times a day.  01/28/19  Yes [provider]  losartan (COZAAR) 100 MG tablet Take 100 mg by mouth daily.    Yes [provider]  olmesartan (BENICAR) 40 MG tablet Take 40 mg by mouth daily. 02/10/19  Yes [provider]  oseltamivir (TAMIFLU) 75 MG capsule Take 1 capsule (75 mg total) by mouth every 12 (twelve) hours. Patient not taking: Reported on 03/18/2019 11/28/18   Hayden Rasmussen, MD    No current facility-administered medications for this encounter.    Current Outpatient Medications  Medication Sig Dispense Refill  . buPROPion (WELLBUTRIN SR) 100 MG 12 hr tablet Take 100 mg by mouth daily.    . clonazePAM (KLONOPIN) 0.5 MG tablet Take 0.5 mg by mouth daily as needed for anxiety.     . cloNIDine (CATAPRES) 0.1 MG tablet Take 0.1 mg by mouth 2 (two) times daily.    . hydrALAZINE (APRESOLINE) 50 MG tablet Take 50 mg by mouth 2 (two) times a day.     . losartan (COZAAR) 100 MG tablet Take 100 mg by mouth daily.     Marland Kitchen olmesartan (BENICAR) 40 MG tablet Take 40 mg by mouth daily.    Marland Kitchen oseltamivir (TAMIFLU) 75 MG capsule Take 1 capsule (75 mg total) by mouth every 12 (twelve) hours. (Patient not taking: Reported on 03/18/2019) 10 capsule 0    Allergies as of  03/18/2019  . (No Known Allergies)    Family History  Problem Relation Age of Onset  . CVA Father   . Diabetes Mother   . Hypertension Mother   . Diabetes Sister   . Hypertension Sister   . Cancer Brother      Social History   Socioeconomic History  . Marital status: Married    Spouse name: Not on file  . Number of children: Not on file  . Years of education: Not on file  . Highest education level: Not on file  Occupational History  . Not on file  Social Needs  . Financial resource strain: Not on  file  . Food insecurity:    Worry: Not on file    Inability: Not on file  . Transportation needs:    Medical: Not on file    Non-medical: Not on file  Tobacco Use  . Smoking status: Former Research scientist (life sciences)  . Smokeless tobacco: Never Used  Substance and Sexual Activity  . Alcohol use: No    Alcohol/week: 0.0 standard drinks  . Drug use: No  . Sexual activity: Not on file  Lifestyle  . Physical activity:    Days per week: Not on file    Minutes per session: Not on file  . Stress: Not on file  Relationships  . Social connections:    Talks on phone: Not on file    Gets together: Not on file    Attends religious service: Not on file    Active member of club or organization: Not on file    Attends meetings of clubs or organizations: Not on file    Relationship status: Not on file  . Intimate partner violence:    Fear of current or ex partner: Not on file    Emotionally abused: Not on file    Physically abused: Not on file    Forced sexual activity: Not on file  Other Topics Concern  . Not on file  Social History Narrative  . Not on file    Review of Systems: PER HPI OTHERWISE ALL SYSTEMS ARE NEGATIVE.   Vitals: Blood pressure (!) 181/100, pulse 75, temperature 98.6 F (37 C), temperature source Oral, resp. rate 17, height 5\' 8"  (1.727 m), weight 81.6 kg, SpO2 97 %.  Physical Exam: General:   Alert,  Well-developed, well-nourished, pleasant and cooperative in NAD Head:  Normocephalic and atraumatic. Eyes:  Sclera clear, no icterus.   Conjunctiva pink. Mouth:  No lesions, dentition normal. Neck:  Supple; no masses. Lungs:  Clear throughout to auscultation.   No wheezes. No acute distress. Heart:  Regular rate and rhythm; no murmurs, clicks, rubs,  or gallops. Abdomen:  Soft, nontender and nondistended. No masses, hepatosplenomegaly or hernias noted. Normal bowel sounds, without guarding, and without rebound.   Msk:  Symmetrical without gross deformities. Normal  posture. Extremities:  Without edema. Neurologic:  Alert and  oriented x4;  grossly normal neurologically. Cervical Nodes:  No significant cervical adenopathy. Psych:  Alert and cooperative. Normal mood and affect.   Lab Results: Recent Labs    03/18/19 2138  WBC 10.8*  HGB 15.6  HCT 47.2  PLT 271   BMET Recent Labs    03/18/19 2138  NA 144  K 3.1*  CL 105  CO2 27  GLUCOSE 124*  BUN 13  CREATININE 1.00  CALCIUM 9.0    Studies/Results: NONE   LOS: 0 days   [employer]  03/18/2019, 10:43 PM

## 2019-03-18 NOTE — Telephone Encounter (Signed)
PLEASE CALL PT. TRIAGE FOR EGD/DIL, [id number] IMPACTION.

## 2019-03-19 ENCOUNTER — Telehealth: Payer: Self-pay | Admitting: Gastroenterology

## 2019-03-19 ENCOUNTER — Other Ambulatory Visit: Payer: Self-pay | Admitting: *Deleted

## 2019-03-19 DIAGNOSIS — R1310 Dysphagia, unspecified: Secondary | ICD-10-CM

## 2019-03-19 DIAGNOSIS — T18128A Food in esophagus causing other injury, initial encounter: Secondary | ICD-10-CM

## 2019-03-19 NOTE — Telephone Encounter (Signed)
Carolyn from endo called. Since patient had COVID-19 test done yesterday, as long as he has not been out since testing he will not have to be retested. If patient has been out then he will need to be retested.  Called patient and he states after he left ED he has been home since. He just woke up when I called. I advised patient he will not have to be retested but he must stay quarantined at home until after procedure or procedure will be cancelled. He voiced understanding. Called endo and made aware.

## 2019-03-19 NOTE — Telephone Encounter (Addendum)
MON JUN 1

## 2019-03-19 NOTE — Telephone Encounter (Signed)
See other phone note

## 2019-03-19 NOTE — Telephone Encounter (Signed)
EGD/DIL MON JUN 1

## 2019-03-19 NOTE — Op Note (Signed)
[facility] Patient Name: Curtis Marquez Procedure Date: 03/18/2019 10:15 PM MRN: [id number] Date of Birth: 1946/05/01 Attending MD: Barney Drain MD, MD CSN: [id number] Age: 73 Admit Type: Outpatient Procedure:                Upper GI endoscopy WITH COLD FORCEPS BIOPSY/FOREIGN                            BODY REMOVAL Indications:              Foreign body in the esophagus. OCCASIONAL IBUPROFEN                            AND HEARTBURN. NO PPI. Providers:                Barney Drain MD, MD, Lurline Del, RN, Aram Candela Referring MD:             Minta Balsam, MD Medicines:                Ondansetron 4 mg IV, Meperidine 75 mg IV, Midazolam                            5 mg IV Complications:            No immediate complications. Estimated Blood Loss:     Estimated blood loss was minimal. Procedure:                Pre-Anesthesia Assessment:                           - Prior to the procedure, a History and Physical                            was performed, and patient medications and                            allergies were reviewed. The patient's tolerance of                            previous anesthesia was also reviewed. The risks                            and benefits of the procedure and the sedation                            options and risks were discussed with the patient.                            All questions were answered, and informed consent                            was obtained. Prior Anticoagulants: The patient has                            taken no previous anticoagulant or antiplatelet  agents. ASA Grade Assessment: II - A patient with                            mild systemic disease. After reviewing the risks                            and benefits, the patient was deemed in                            satisfactory condition to undergo the procedure.                            After obtaining informed consent, the endoscope was              passed under direct vision. Throughout the                            procedure, the patient's blood pressure, pulse, and                            oxygen saturations were monitored continuously. The                            [id number]-[id number] ([id number]) scope was introduced through the                            mouth, and advanced to the second part of duodenum.                            The upper GI endoscopy was accomplished without                            difficulty. The patient tolerated the procedure                            well. Scope In: 11:08:13 PM Scope Out: 11:17:57 PM Total Procedure Duration: 0 hours 9 minutes 44 seconds  Findings:      Food was found in the lower third of the esophagus. Removal of food was       accomplished.      One benign-appearing, intrinsic moderate (circumferential scarring or       stenosis; an endoscope may pass) stenosis was found. This stenosis       measured 1.3 cm (inner diameter). The stenosis was traversed.      Segmental mild inflammation characterized by congestion (edema),       erosions and erythema was found in the gastric antrum. Biopsies(2: BODY,       1: INCISURA, 2: ANTRUM) were taken with a cold forceps for Helicobacter       pylori testing.      The examined duodenum was normal. Impression:               - Food in the lower third of the esophagus. Removal  was successful.                           - Benign-appearing esophageal stenosis.                           - MILD Gastritis. Biopsied. Moderate Sedation:      Moderate (conscious) sedation was administered by the endoscopy nurse       and supervised by the endoscopist. The following parameters were       monitored: oxygen saturation, heart rate, blood pressure, and response       to care. Total physician intraservice time was 18 minutes. Recommendation:           - Patient has a contact number available for                             emergencies. The signs and symptoms of potential                            delayed complications were discussed with the                            patient. Return to normal activities tomorrow.                            Written discharge instructions were provided to the                            patient.                           - Soft diet.                           - Continue present medications.                           - Await pathology results.                           - Repeat upper endoscopy at the next available                            appointment for retreatment.                           - Return to GI office in 6 months. Procedure Code(s):        --- Professional ---                           [id number], Esophagogastroduodenoscopy, flexible,                            transoral; with removal of foreign body(s)                           43239, Esophagogastroduodenoscopy, flexible,  transoral; with biopsy, single or multiple                           G0500, Moderate sedation services provided by the                            same physician or other qualified health care                            professional performing a gastrointestinal                            endoscopic service that sedation supports,                            requiring the presence of an independent trained                            observer to assist in the monitoring of the                            patient's level of consciousness and physiological                            status; initial 15 minutes of intra-service time;                            patient age 67 years or older (additional time may                            be reported with [phone], as appropriate) Diagnosis Code(s):        --- Professional ---                           [phone], Food in esophagus causing other injury,                            initial encounter                           K22.2,  Esophageal obstruction                           K29.70, Gastritis, unspecified, without bleeding                           T18.108A, Unspecified foreign body in esophagus                            causing other injury, initial encounter CPT copyright 2019 [employer]. All rights reserved. The codes documented in this report are preliminary and upon coder review may  be revised to meet current compliance requirements. Barney Drain, MD Barney Drain MD, MD 03/18/2019 11:37:15 PM This report has been signed electronically. Number of Addenda: 0

## 2019-03-19 NOTE — Telephone Encounter (Signed)
Noted. Patient already scheduled. See previous phone note

## 2019-03-19 NOTE — Telephone Encounter (Signed)
Please advise how soon patient should be scheduled?

## 2019-03-19 NOTE — Telephone Encounter (Signed)
Called patient. He is scheduled for procedure 03/24/2019 at 8:30am. Patient aware will need to arrive at 7:30am. Discussed EGD instructions with patient in detail. He voiced understanding. Endo is aware of add on. Patient had COVID-19 testing done in ED yesterday. Angie is aware and will see if patient will have to go for testing again. Orders for procedure entered.

## 2019-03-20 DIAGNOSIS — I10 Essential (primary) hypertension: Secondary | ICD-10-CM | POA: Diagnosis not present

## 2019-03-21 ENCOUNTER — Encounter (HOSPITAL_COMMUNITY): Payer: Self-pay | Admitting: Gastroenterology

## 2019-03-24 ENCOUNTER — Encounter (HOSPITAL_COMMUNITY)
Admission: RE | Disposition: A | Discharge status: Home / Self Care | Payer: Self-pay | Source: Home / Self Care | Attending: Gastroenterology

## 2019-03-24 ENCOUNTER — Encounter (HOSPITAL_COMMUNITY): Payer: Self-pay

## 2019-03-24 ENCOUNTER — Ambulatory Visit (HOSPITAL_COMMUNITY)
Admission: RE | Admit: 2019-03-24 | Discharge: 2019-03-24 | Disposition: A | Discharge status: Home / Self Care | Payer: PPO | Attending: Gastroenterology | Admitting: Gastroenterology

## 2019-03-24 ENCOUNTER — Other Ambulatory Visit: Payer: Self-pay

## 2019-03-24 DIAGNOSIS — Z87891 Personal history of nicotine dependence: Secondary | ICD-10-CM | POA: Insufficient documentation

## 2019-03-24 DIAGNOSIS — K219 Gastro-esophageal reflux disease without esophagitis: Secondary | ICD-10-CM | POA: Insufficient documentation

## 2019-03-24 DIAGNOSIS — K222 Esophageal obstruction: Secondary | ICD-10-CM | POA: Diagnosis not present

## 2019-03-24 DIAGNOSIS — K2970 Gastritis, unspecified, without bleeding: Secondary | ICD-10-CM | POA: Insufficient documentation

## 2019-03-24 DIAGNOSIS — I10 Essential (primary) hypertension: Secondary | ICD-10-CM | POA: Diagnosis not present

## 2019-03-24 DIAGNOSIS — T18128A Food in esophagus causing other injury, initial encounter: Secondary | ICD-10-CM

## 2019-03-24 DIAGNOSIS — Z8546 Personal history of malignant neoplasm of prostate: Secondary | ICD-10-CM | POA: Insufficient documentation

## 2019-03-24 DIAGNOSIS — Z79899 Other long term (current) drug therapy: Secondary | ICD-10-CM | POA: Diagnosis not present

## 2019-03-24 DIAGNOSIS — R1310 Dysphagia, unspecified: Secondary | ICD-10-CM | POA: Diagnosis not present

## 2019-03-24 HISTORY — PX: ESOPHAGOGASTRODUODENOSCOPY: SHX5428

## 2019-03-24 HISTORY — PX: SAVORY DILATION: SHX5439

## 2019-03-24 SURGERY — EGD (ESOPHAGOGASTRODUODENOSCOPY)
Anesthesia: Moderate Sedation

## 2019-03-24 MED ORDER — CLARITHROMYCIN 500 MG PO TABS: ORAL_TABLET | ORAL | 0 refills | Status: DC

## 2019-03-24 MED ORDER — ONDANSETRON HCL 4 MG/2ML IJ SOLN
INTRAMUSCULAR | Status: AC
  Filled 2019-03-24: qty 2

## 2019-03-24 MED ORDER — LIDOCAINE VISCOUS HCL 2 % MT SOLN
OROMUCOSAL | Status: AC
  Filled 2019-03-24: qty 15

## 2019-03-24 MED ORDER — MEPERIDINE HCL 100 MG/ML IJ SOLN
INTRAMUSCULAR | Status: DC | PRN
  Administered 2019-03-24: 50 mg

## 2019-03-24 MED ORDER — LORAZEPAM 2 MG/ML IJ SOLN
INTRAMUSCULAR | Status: AC
  Filled 2019-03-24: qty 1

## 2019-03-24 MED ORDER — HYDRALAZINE HCL 20 MG/ML IJ SOLN
INTRAMUSCULAR | Status: AC
  Filled 2019-03-24: qty 1

## 2019-03-24 MED ORDER — SODIUM CHLORIDE 0.9 % IV SOLN
INTRAVENOUS | Status: DC
  Administered 2019-03-24: 08:00:00 via INTRAVENOUS

## 2019-03-24 MED ORDER — LORAZEPAM 2 MG/ML IJ SOLN
0.5000 mg | Freq: Once | INTRAMUSCULAR | Status: AC
  Administered 2019-03-24: 0.5 mg via INTRAVENOUS

## 2019-03-24 MED ORDER — MEPERIDINE HCL 100 MG/ML IJ SOLN
INTRAMUSCULAR | Status: AC
  Filled 2019-03-24: qty 1

## 2019-03-24 MED ORDER — MIDAZOLAM HCL 5 MG/5ML IJ SOLN
INTRAMUSCULAR | Status: DC | PRN
  Administered 2019-03-24 (×2): 2 mg via INTRAVENOUS
  Administered 2019-03-24: 1 mg via INTRAVENOUS

## 2019-03-24 MED ORDER — HYDRALAZINE HCL 20 MG/ML IJ SOLN
10.0000 mg | Freq: Once | INTRAMUSCULAR | Status: AC
  Administered 2019-03-24: 08:00:00 10 mg via INTRAVENOUS

## 2019-03-24 MED ORDER — LIDOCAINE VISCOUS HCL 2 % MT SOLN
OROMUCOSAL | Status: DC | PRN
  Administered 2019-03-24: 1 via OROMUCOSAL

## 2019-03-24 MED ORDER — ONDANSETRON HCL 4 MG/2ML IJ SOLN
INTRAMUSCULAR | Status: DC | PRN
  Administered 2019-03-24: 4 mg via INTRAVENOUS

## 2019-03-24 MED ORDER — MIDAZOLAM HCL 5 MG/5ML IJ SOLN
INTRAMUSCULAR | Status: AC
  Filled 2019-03-24: qty 5

## 2019-03-24 MED ORDER — AMOXICILLIN 500 MG PO TABS: ORAL_TABLET | ORAL | 0 refills | Status: DC

## 2019-03-24 NOTE — Discharge Instructions (Signed)
I dilated your esophagus. You have a stricture near the base of your esophagus. Your STOMACH BIOPSIES SHOW H PYLORI GASTRITIS.   DRINK WATER TO KEEP YOUR URINE LIGHT YELLOW.  TAKE AMOXICILLIN 500 mg 2 PILLS TWICE DAILY for 10 days and Biaxin 500 mg ONE PILL TWICE DAILY for 10 days. CONTINUE omeprazole twice daily for one month then decrease to once daily forvever. Medication side effects include  NAUSEA/VOMTIING/DIARRHEA., abd pain, and metallic taste.  YOUR SISTERS, BROTHERS, CHILDREN, AND PARENTS NEED TO HAVE THE H PYLORI STOOL OR BREATH TEST.   FOLLOW UP IN 4 MOS with DR. Roxy Filler. UPPER ENDOSCOPY AFTER CARE Read the instructions outlined below and refer to this sheet in the next week. These discharge instructions provide you with general information on caring for yourself after you leave the hospital. While your treatment has been planned according to the most current medical practices available, unavoidable complications occasionally occur. If you have any problems or questions after discharge, call DR. Gracen Ringwald, [phone].  ACTIVITY  You may resume your regular activity, but move at a slower pace for the next 24 hours.   Take frequent rest periods for the next 24 hours.   Walking will help get rid of the air and reduce the bloated feeling in your belly (abdomen).   No driving for 24 hours (because of the medicine (anesthesia) used during the test).   You may shower.   Do not sign any important legal documents or operate any machinery for 24 hours (because of the anesthesia used during the test).    NUTRITION  Drink plenty of fluids.   You may resume your normal diet as instructed by your doctor.   Begin with a light meal and progress to your normal diet. Heavy or fried foods are harder to digest and may make you feel sick to your stomach (nauseated).   Avoid alcoholic beverages for 24 hours or as instructed.    MEDICATIONS  You may resume your normal  medications.   WHAT YOU CAN EXPECT TODAY  Some feelings of bloating in the abdomen.   Passage of more gas than usual.    IF YOU HAD A BIOPSY TAKEN DURING THE UPPER ENDOSCOPY:  Eat a soft diet IF YOU HAVE NAUSEA, BLOATING, ABDOMINAL PAIN, OR VOMITING.    FINDING OUT THE RESULTS OF YOUR TEST Not all test results are available during your visit. DR. Oneida Alar WILL CALL YOU WITHIN 7 DAYS OF YOUR PROCEDUE WITH YOUR RESULTS. Do not assume everything is normal if you have not heard from DR. Zacherie Honeyman IN ONE WEEK, CALL HER OFFICE AT [phone].  SEEK IMMEDIATE MEDICAL ATTENTION AND CALL THE OFFICE: [phone] IF:  You have more than a spotting of blood in your stool.   Your belly is swollen (abdominal distention).   You are nauseated or vomiting.   You have a temperature over 101F.   You have abdominal pain or discomfort that is severe or gets worse throughout the day.  Gastritis  Gastritis is an inflammation (the body's way of reacting to injury and/or infection) of the stomach. It is often caused by viral or bacterial (germ) infections. It can also be caused BY ASPIRIN, BC/GOODY POWDER'S, (IBUPROFEN) MOTRIN, OR ALEVE (NAPROXEN), chemicals (including alcohol), SPICY FOODS, and medications. This illness may be associated with generalized malaise (feeling tired, not well), UPPER ABDOMINAL STOMACH cramps, and fever. One common bacterial cause of gastritis is an organism known as H. Pylori. This can be treated with antibiotics.  ESOPHAGEAL STRICTURE  Esophageal strictures can be caused by stomach acid backing up into the tube that carries food from the mouth down to the stomach (lower esophagus).  TREATMENT There are a number of medicines used to treat reflux/stricture, including: Antacids.  Proton-pump inhibitors: OMEPRAZOLE PEPCID OR TAGAMET  HOME CARE INSTRUCTIONS Eat 2-3 hours before going to bed.  Try to reach and maintain a healthy weight.  Do not eat just a few very large  meals. Instead, eat 4 TO 6 smaller meals throughout the day.  Try to identify foods and beverages that make your symptoms worse, and avoid these.  Avoid tight clothing.  Do not exercise right after eating.

## 2019-03-24 NOTE — Interval H&P Note (Signed)
History and Physical Interval Note:  03/24/2019 8:41 AM  Rhythm Tat  has presented today for surgery, with the diagnosis of DYSPHAGIA, FOOD IMPACTION.  The various methods of treatment have been discussed with the patient and family. After consideration of risks, benefits and other options for treatment, the patient has consented to  Procedure(s) with comments: ESOPHAGOGASTRODUODENOSCOPY (EGD) (N/A) - 8:30AM - pt had Covid test done in ED & has not been anywhere since, office spoke with him and he understands to remain quarantined SAVORY DILATION (N/A) as a surgical intervention.  The patient's history has been reviewed, patient examined, no change in status, stable for surgery.  I have reviewed the patient's chart and labs.  Questions were answered to the patient's satisfaction.     [employer]

## 2019-03-24 NOTE — Op Note (Signed)
[facility] Patient Name: Curtis Marquez Procedure Date: 03/24/2019 8:28 AM MRN: [id number] Date of Birth: 29-Jul-1946 Attending MD: Barney Drain MD, MD CSN: [id number] Age: 73 Admit Type: Outpatient Procedure:                Upper GI endoscopy WITH ESOPHAGEAL DILATION Indications:              Dysphagia, PATH MAY 26: H PYLORI GASTRITIS Providers:                Barney Drain MD, MD, Janeece Riggers, RN, Raphael Gibney, Technician, Randa Spike, Technician Referring MD:             Fuller Canada Manuella Ghazi MD, MD Medicines:                Ondansetron 4 mg IV, Midazolam 5 mg IV, Meperidine                            50 mg IV Complications:            No immediate complications. Estimated Blood Loss:     Estimated blood loss was minimal. Procedure:                Pre-Anesthesia Assessment:                           - Prior to the procedure, a History and Physical                            was performed, and patient medications and                            allergies were reviewed. The patient's tolerance of                            previous anesthesia was also reviewed. The risks                            and benefits of the procedure and the sedation                            options and risks were discussed with the patient.                            All questions were answered, and informed consent                            was obtained. Prior Anticoagulants: The patient has                            taken no previous anticoagulant or antiplatelet                            agents. ASA Grade Assessment: II - A patient with  mild systemic disease. After reviewing the risks                            and benefits, the patient was deemed in                            satisfactory condition to undergo the procedure.                            After obtaining informed consent, the endoscope was                            passed under direct  vision. Throughout the                            procedure, the patient's blood pressure, pulse, and                            oxygen saturations were monitored continuously. The                            [id number]-[id number] ([id number]) scope was introduced through the                            mouth, and advanced to the second part of duodenum.                            The upper GI endoscopy was accomplished without                            difficulty. The patient tolerated the procedure                            well. Scope In: 8:57:08 AM Scope Out: 9:03:16 AM Total Procedure Duration: 0 hours 6 minutes 8 seconds  Findings:      One benign-appearing, intrinsic moderate (circumferential scarring or       stenosis; an endoscope may pass) stenosis was found. This stenosis       measured 1.3 cm (inner diameter). The stenosis was traversed. A       guidewire was placed and the scope was withdrawn. Dilation was performed       with a Savary dilator with no resistance at 12.8 mm and mild resistance       at 14 mm, 15 mm and 16 mm.      Localized mild inflammation characterized by congestion (edema) and       erythema was found in the gastric antrum.      The examined duodenum was normal. Impression:               - Benign-appearing esophageal stenosis. Dilated.                           - MILD Gastritis. Moderate Sedation:      Moderate (conscious) sedation was administered by the endoscopy nurse       and supervised by the endoscopist. The  following parameters were       monitored: oxygen saturation, heart rate, blood pressure, and response       to care. Total physician intraservice time was 19 minutes. Recommendation:           - Patient has a contact number available for                            emergencies. The signs and symptoms of potential                            delayed complications were discussed with the                            patient. Return to normal activities tomorrow.                             Written discharge instructions were provided to the                            patient.                           - Low fat diet.                           - Continue present medications. ADD OMEPRAZOLE 20                            MG BID FOR 10 DAYS WITH AMXOICILLIN 1GM BID/BIAXIN                            500 MG BID FOR 10 DAYS. THEN TAKE OMEPRAZOLE ONCE                            DAILY FOREVER.                           - Return to my office in 4 months. Procedure Code(s):        --- Professional ---                           [phone], Esophagogastroduodenoscopy, flexible,                            transoral; with insertion of guide wire followed by                            passage of dilator(s) through esophagus over guide                            wire                           G0500, Moderate sedation services provided by the  same physician or other qualified health care                            professional performing a gastrointestinal                            endoscopic service that sedation supports,                            requiring the presence of an independent trained                            observer to assist in the monitoring of the                            patient's level of consciousness and physiological                            status; initial 15 minutes of intra-service time;                            patient age 17 years or older (additional time may                            be reported with [phone], as appropriate) Diagnosis Code(s):        --- Professional ---                           K22.2, Esophageal obstruction                           K29.70, Gastritis, unspecified, without bleeding                           R13.10, Dysphagia, unspecified CPT copyright 2019 American Medical Association. All rights reserved. The codes documented in this report are preliminary and upon coder review may  be  revised to meet current compliance requirements. Barney Drain, MD Barney Drain MD, MD 03/24/2019 9:22:24 AM This report has been signed electronically. Number of Addenda: 0

## 2019-03-24 NOTE — OR Nursing (Signed)
Patient blood pressure elevated  229/125, 230/122, 218/120. No other symptoms no chest pain no shortness of breath. 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 Oneida Alar notified. Hydralazine 10mg  ordered and 0.5 Ativan .  Recheck Blood pressure in 15 min

## 2019-03-24 NOTE — Progress Notes (Signed)
CC'D TO PCP °

## 2019-03-25 DIAGNOSIS — Z6827 Body mass index (BMI) 27.0-27.9, adult: Secondary | ICD-10-CM | POA: Diagnosis not present

## 2019-03-25 DIAGNOSIS — I10 Essential (primary) hypertension: Secondary | ICD-10-CM | POA: Diagnosis not present

## 2019-03-25 DIAGNOSIS — Z299 Encounter for prophylactic measures, unspecified: Secondary | ICD-10-CM | POA: Diagnosis not present

## 2019-03-25 DIAGNOSIS — Z713 Dietary counseling and surveillance: Secondary | ICD-10-CM | POA: Diagnosis not present

## 2019-03-28 ENCOUNTER — Encounter (HOSPITAL_COMMUNITY): Payer: Self-pay | Admitting: Gastroenterology

## 2019-04-21 DIAGNOSIS — I10 Essential (primary) hypertension: Secondary | ICD-10-CM | POA: Diagnosis not present

## 2019-05-20 DIAGNOSIS — I10 Essential (primary) hypertension: Secondary | ICD-10-CM | POA: Diagnosis not present

## 2019-06-03 DIAGNOSIS — Z125 Encounter for screening for malignant neoplasm of prostate: Secondary | ICD-10-CM | POA: Diagnosis not present

## 2019-06-03 DIAGNOSIS — R7989 Other specified abnormal findings of blood chemistry: Secondary | ICD-10-CM | POA: Diagnosis not present

## 2019-06-03 DIAGNOSIS — R5383 Other fatigue: Secondary | ICD-10-CM | POA: Diagnosis not present

## 2019-06-03 DIAGNOSIS — Z1211 Encounter for screening for malignant neoplasm of colon: Secondary | ICD-10-CM | POA: Diagnosis not present

## 2019-06-03 DIAGNOSIS — Z1331 Encounter for screening for depression: Secondary | ICD-10-CM | POA: Diagnosis not present

## 2019-06-03 DIAGNOSIS — Z7189 Other specified counseling: Secondary | ICD-10-CM | POA: Diagnosis not present

## 2019-06-03 DIAGNOSIS — Z0000 Encounter for general adult medical examination without abnormal findings: Secondary | ICD-10-CM | POA: Diagnosis not present

## 2019-06-03 DIAGNOSIS — Z1339 Encounter for screening examination for other mental health and behavioral disorders: Secondary | ICD-10-CM | POA: Diagnosis not present

## 2019-06-03 DIAGNOSIS — Z6826 Body mass index (BMI) 26.0-26.9, adult: Secondary | ICD-10-CM | POA: Diagnosis not present

## 2019-06-03 DIAGNOSIS — E7800 Pure hypercholesterolemia, unspecified: Secondary | ICD-10-CM | POA: Diagnosis not present

## 2019-06-03 DIAGNOSIS — Z299 Encounter for prophylactic measures, unspecified: Secondary | ICD-10-CM | POA: Diagnosis not present

## 2019-06-03 DIAGNOSIS — Z79899 Other long term (current) drug therapy: Secondary | ICD-10-CM | POA: Diagnosis not present

## 2019-06-13 DIAGNOSIS — N419 Inflammatory disease of prostate, unspecified: Secondary | ICD-10-CM | POA: Diagnosis not present

## 2019-06-13 DIAGNOSIS — Z6826 Body mass index (BMI) 26.0-26.9, adult: Secondary | ICD-10-CM | POA: Diagnosis not present

## 2019-06-13 DIAGNOSIS — Z299 Encounter for prophylactic measures, unspecified: Secondary | ICD-10-CM | POA: Diagnosis not present

## 2019-06-13 DIAGNOSIS — I10 Essential (primary) hypertension: Secondary | ICD-10-CM | POA: Diagnosis not present

## 2019-06-13 DIAGNOSIS — R350 Frequency of micturition: Secondary | ICD-10-CM | POA: Diagnosis not present

## 2019-06-20 DIAGNOSIS — I10 Essential (primary) hypertension: Secondary | ICD-10-CM | POA: Diagnosis not present

## 2019-06-25 ENCOUNTER — Encounter: Payer: Self-pay | Admitting: Gastroenterology

## 2019-07-08 DIAGNOSIS — I10 Essential (primary) hypertension: Secondary | ICD-10-CM | POA: Diagnosis not present

## 2019-07-08 DIAGNOSIS — E7800 Pure hypercholesterolemia, unspecified: Secondary | ICD-10-CM | POA: Diagnosis not present

## 2019-07-08 DIAGNOSIS — Z6826 Body mass index (BMI) 26.0-26.9, adult: Secondary | ICD-10-CM | POA: Diagnosis not present

## 2019-07-08 DIAGNOSIS — Z299 Encounter for prophylactic measures, unspecified: Secondary | ICD-10-CM | POA: Diagnosis not present

## 2019-07-08 DIAGNOSIS — Z713 Dietary counseling and surveillance: Secondary | ICD-10-CM | POA: Diagnosis not present

## 2019-07-08 DIAGNOSIS — N182 Chronic kidney disease, stage 2 (mild): Secondary | ICD-10-CM | POA: Diagnosis not present

## 2019-07-22 DIAGNOSIS — I10 Essential (primary) hypertension: Secondary | ICD-10-CM | POA: Diagnosis not present

## 2019-08-20 DIAGNOSIS — I10 Essential (primary) hypertension: Secondary | ICD-10-CM | POA: Diagnosis not present

## 2019-09-22 DIAGNOSIS — I10 Essential (primary) hypertension: Secondary | ICD-10-CM | POA: Diagnosis not present

## 2019-11-18 DIAGNOSIS — I10 Essential (primary) hypertension: Secondary | ICD-10-CM | POA: Diagnosis not present

## 2019-12-06 ENCOUNTER — Other Ambulatory Visit: Payer: Self-pay

## 2019-12-06 ENCOUNTER — Ambulatory Visit: Payer: Medicare Other | Attending: Internal Medicine

## 2019-12-06 DIAGNOSIS — Z23 Encounter for immunization: Secondary | ICD-10-CM

## 2019-12-06 NOTE — Progress Notes (Signed)
   Covid-19 Vaccination Clinic  Name:  Curtis Marquez    MRN: [id number] DOB: 02/03/1946  12/06/2019  Mr. [name] was observed post Covid-19 immunization for 15 minutes without incidence. He was provided with Vaccine Information Sheet and instruction to access the V-Safe system.   Mr. [name] was instructed to call 911 with any severe reactions post vaccine: Marland Kitchen Difficulty breathing  . Swelling of your face and throat  . A fast heartbeat  . A bad rash all over your body  . Dizziness and weakness    Immunizations Administered    Name Date Dose VIS Date Route   Moderna COVID-19 Vaccine 12/06/2019 11:32 AM 0.5 mL 09/23/2019 Intramuscular   Manufacturer: Moderna   Lot: [id number]   [place]: [id number]

## 2019-12-21 DIAGNOSIS — I10 Essential (primary) hypertension: Secondary | ICD-10-CM | POA: Diagnosis not present

## 2020-01-03 ENCOUNTER — Ambulatory Visit: Payer: Medicare Other | Attending: Internal Medicine

## 2020-01-03 DIAGNOSIS — Z23 Encounter for immunization: Secondary | ICD-10-CM

## 2020-01-03 NOTE — Progress Notes (Signed)
   Covid-19 Vaccination Clinic  Name:  Curtis Marquez    MRN: [id number] DOB: 1946/03/27  01/03/2020  [name] was observed post Covid-19 immunization for 15 minutes without incident. He was provided with Vaccine Information Sheet and instruction to access the V-Safe system.   [name] was instructed to call 911 with any severe reactions post vaccine: Marland Kitchen Difficulty breathing  . Swelling of face and throat  . A fast heartbeat  . A bad rash all over body  . Dizziness and weakness   Immunizations Administered    Name Date Dose VIS Date Route   Moderna COVID-19 Vaccine 01/03/2020 10:11 AM 0.5 mL 09/23/2019 Intramuscular   Manufacturer: Moderna   Lot: [id number]   [place]: [id number]

## 2020-01-20 DIAGNOSIS — I10 Essential (primary) hypertension: Secondary | ICD-10-CM | POA: Diagnosis not present

## 2020-01-21 DIAGNOSIS — M549 Dorsalgia, unspecified: Secondary | ICD-10-CM | POA: Diagnosis not present

## 2020-01-21 DIAGNOSIS — M1990 Unspecified osteoarthritis, unspecified site: Secondary | ICD-10-CM | POA: Diagnosis not present

## 2020-02-20 DIAGNOSIS — I10 Essential (primary) hypertension: Secondary | ICD-10-CM | POA: Diagnosis not present

## 2020-03-09 DIAGNOSIS — E7800 Pure hypercholesterolemia, unspecified: Secondary | ICD-10-CM | POA: Diagnosis not present

## 2020-03-09 DIAGNOSIS — I10 Essential (primary) hypertension: Secondary | ICD-10-CM | POA: Diagnosis not present

## 2020-03-09 DIAGNOSIS — Z299 Encounter for prophylactic measures, unspecified: Secondary | ICD-10-CM | POA: Diagnosis not present

## 2020-03-09 DIAGNOSIS — Z87891 Personal history of nicotine dependence: Secondary | ICD-10-CM | POA: Diagnosis not present

## 2020-03-21 DIAGNOSIS — I10 Essential (primary) hypertension: Secondary | ICD-10-CM | POA: Diagnosis not present

## 2020-03-22 DIAGNOSIS — K219 Gastro-esophageal reflux disease without esophagitis: Secondary | ICD-10-CM | POA: Diagnosis not present

## 2020-03-22 DIAGNOSIS — I129 Hypertensive chronic kidney disease with stage 1 through stage 4 chronic kidney disease, or unspecified chronic kidney disease: Secondary | ICD-10-CM | POA: Diagnosis not present

## 2020-03-22 DIAGNOSIS — N182 Chronic kidney disease, stage 2 (mild): Secondary | ICD-10-CM | POA: Diagnosis not present

## 2020-03-22 DIAGNOSIS — E785 Hyperlipidemia, unspecified: Secondary | ICD-10-CM | POA: Diagnosis not present

## 2020-04-21 DIAGNOSIS — K219 Gastro-esophageal reflux disease without esophagitis: Secondary | ICD-10-CM | POA: Diagnosis not present

## 2020-04-21 DIAGNOSIS — I10 Essential (primary) hypertension: Secondary | ICD-10-CM | POA: Diagnosis not present

## 2020-04-21 DIAGNOSIS — M1990 Unspecified osteoarthritis, unspecified site: Secondary | ICD-10-CM | POA: Diagnosis not present

## 2020-04-21 DIAGNOSIS — E785 Hyperlipidemia, unspecified: Secondary | ICD-10-CM | POA: Diagnosis not present

## 2020-05-10 DIAGNOSIS — I10 Essential (primary) hypertension: Secondary | ICD-10-CM | POA: Diagnosis not present

## 2020-05-10 DIAGNOSIS — Z299 Encounter for prophylactic measures, unspecified: Secondary | ICD-10-CM | POA: Diagnosis not present

## 2020-05-10 DIAGNOSIS — E7800 Pure hypercholesterolemia, unspecified: Secondary | ICD-10-CM | POA: Diagnosis not present

## 2020-05-21 DIAGNOSIS — K219 Gastro-esophageal reflux disease without esophagitis: Secondary | ICD-10-CM | POA: Diagnosis not present

## 2020-05-21 DIAGNOSIS — E785 Hyperlipidemia, unspecified: Secondary | ICD-10-CM | POA: Diagnosis not present

## 2020-05-21 DIAGNOSIS — I10 Essential (primary) hypertension: Secondary | ICD-10-CM | POA: Diagnosis not present

## 2020-05-21 DIAGNOSIS — M1990 Unspecified osteoarthritis, unspecified site: Secondary | ICD-10-CM | POA: Diagnosis not present

## 2020-06-08 DIAGNOSIS — Z299 Encounter for prophylactic measures, unspecified: Secondary | ICD-10-CM | POA: Diagnosis not present

## 2020-06-08 DIAGNOSIS — R5383 Other fatigue: Secondary | ICD-10-CM | POA: Diagnosis not present

## 2020-06-08 DIAGNOSIS — Z0000 Encounter for general adult medical examination without abnormal findings: Secondary | ICD-10-CM | POA: Diagnosis not present

## 2020-06-08 DIAGNOSIS — E7800 Pure hypercholesterolemia, unspecified: Secondary | ICD-10-CM | POA: Diagnosis not present

## 2020-06-08 DIAGNOSIS — Z79899 Other long term (current) drug therapy: Secondary | ICD-10-CM | POA: Diagnosis not present

## 2020-06-08 DIAGNOSIS — Z7189 Other specified counseling: Secondary | ICD-10-CM | POA: Diagnosis not present

## 2020-06-15 DIAGNOSIS — Z299 Encounter for prophylactic measures, unspecified: Secondary | ICD-10-CM | POA: Diagnosis not present

## 2020-06-15 DIAGNOSIS — I10 Essential (primary) hypertension: Secondary | ICD-10-CM | POA: Diagnosis not present

## 2020-06-15 DIAGNOSIS — Z87891 Personal history of nicotine dependence: Secondary | ICD-10-CM | POA: Diagnosis not present

## 2020-06-22 DIAGNOSIS — I10 Essential (primary) hypertension: Secondary | ICD-10-CM | POA: Diagnosis not present

## 2020-07-20 DIAGNOSIS — Z299 Encounter for prophylactic measures, unspecified: Secondary | ICD-10-CM | POA: Diagnosis not present

## 2020-07-20 DIAGNOSIS — I739 Peripheral vascular disease, unspecified: Secondary | ICD-10-CM | POA: Diagnosis not present

## 2020-07-20 DIAGNOSIS — I10 Essential (primary) hypertension: Secondary | ICD-10-CM | POA: Diagnosis not present

## 2020-07-20 DIAGNOSIS — N183 Chronic kidney disease, stage 3 unspecified: Secondary | ICD-10-CM | POA: Diagnosis not present

## 2020-07-22 DIAGNOSIS — I10 Essential (primary) hypertension: Secondary | ICD-10-CM | POA: Diagnosis not present

## 2020-08-02 DIAGNOSIS — I739 Peripheral vascular disease, unspecified: Secondary | ICD-10-CM | POA: Diagnosis not present

## 2020-08-20 DIAGNOSIS — N183 Chronic kidney disease, stage 3 unspecified: Secondary | ICD-10-CM | POA: Diagnosis not present

## 2020-08-20 DIAGNOSIS — I129 Hypertensive chronic kidney disease with stage 1 through stage 4 chronic kidney disease, or unspecified chronic kidney disease: Secondary | ICD-10-CM | POA: Diagnosis not present

## 2020-08-20 DIAGNOSIS — E7849 Other hyperlipidemia: Secondary | ICD-10-CM | POA: Diagnosis not present

## 2020-08-21 DIAGNOSIS — I10 Essential (primary) hypertension: Secondary | ICD-10-CM | POA: Diagnosis not present

## 2020-08-21 IMAGING — CR DG CHEST 1V PORT
1 series · 1 of 1 positions shown · non-contrast
Comparison: None.

CLINICAL DATA: Code sepsis, fevers, weakness

EXAM:
PORTABLE CHEST 1 VIEW

[ap]
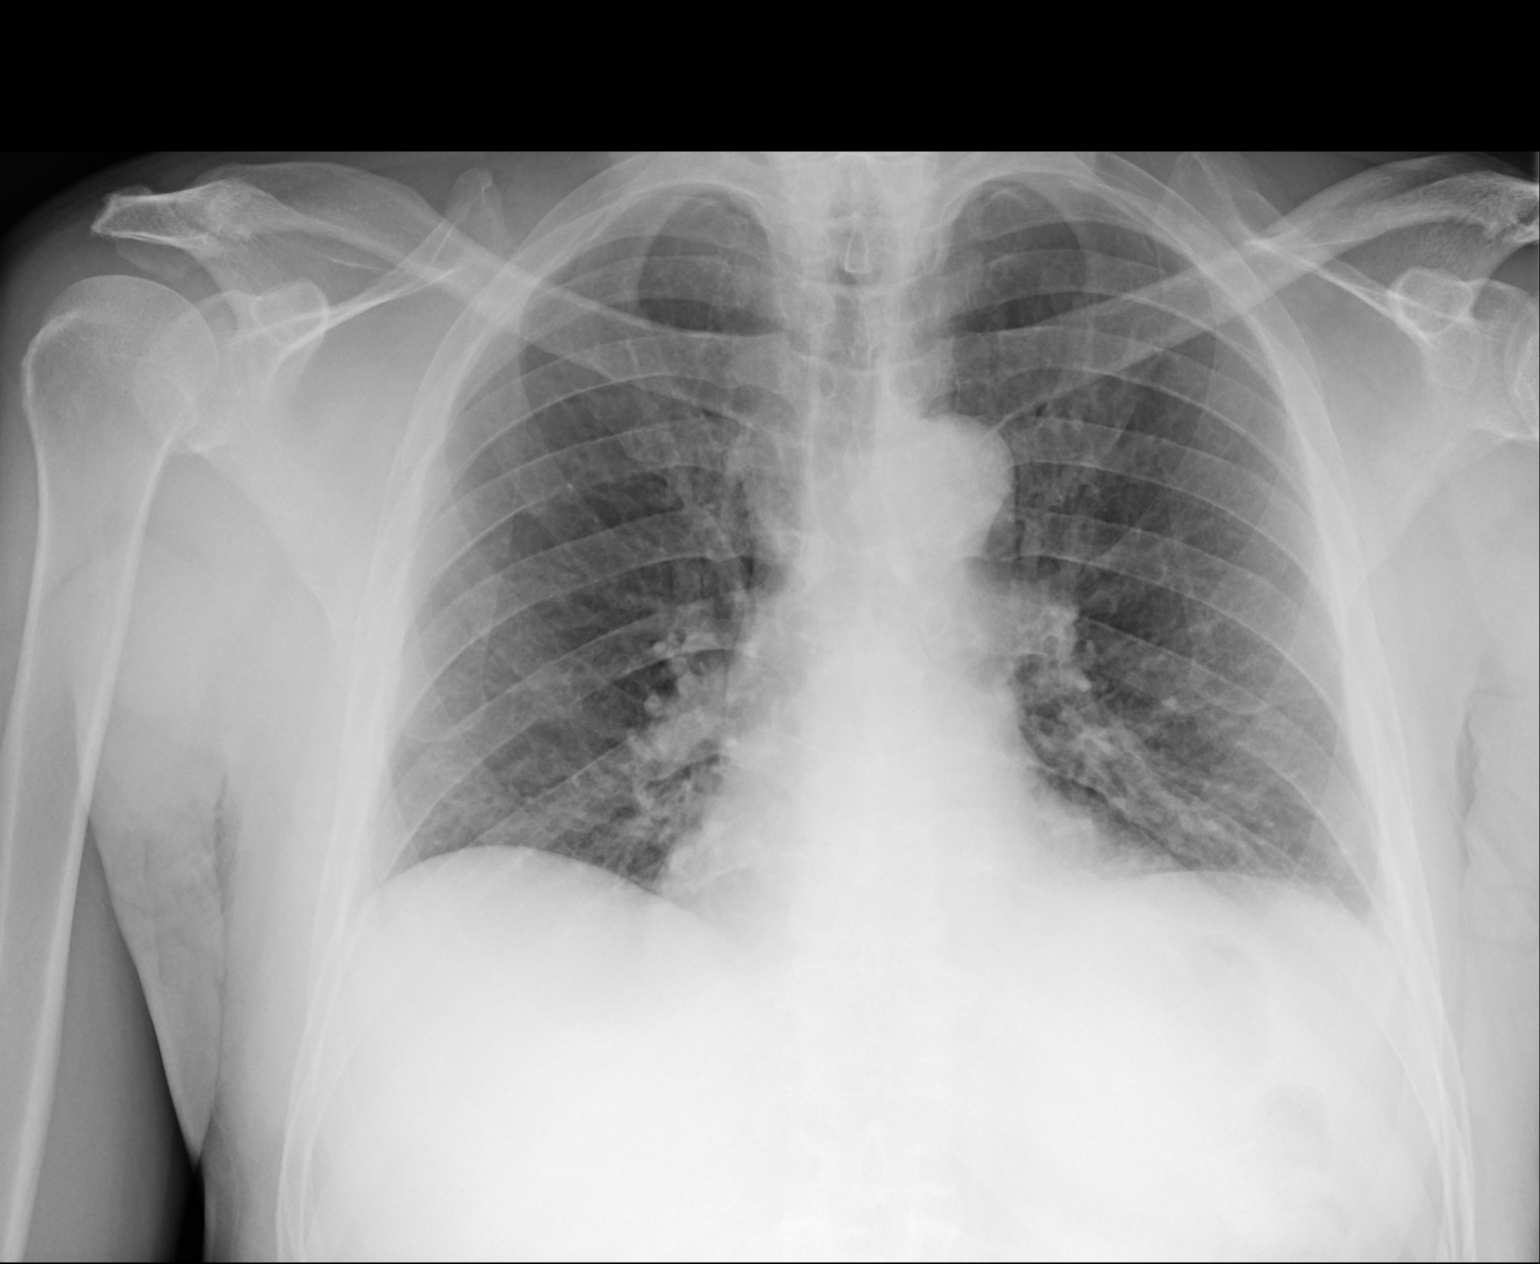

[1 of 1 positions shown; findings below may reference images not displayed]

FINDINGS: The heart size and mediastinal contours are within normal limits.
Both lungs are clear. The visualized skeletal structures are
unremarkable.
IMPRESSION: No active disease.

## 2020-09-21 DIAGNOSIS — I10 Essential (primary) hypertension: Secondary | ICD-10-CM | POA: Diagnosis not present

## 2020-10-12 DIAGNOSIS — I739 Peripheral vascular disease, unspecified: Secondary | ICD-10-CM | POA: Diagnosis not present

## 2020-10-12 DIAGNOSIS — I10 Essential (primary) hypertension: Secondary | ICD-10-CM | POA: Diagnosis not present

## 2020-10-12 DIAGNOSIS — N183 Chronic kidney disease, stage 3 unspecified: Secondary | ICD-10-CM | POA: Diagnosis not present

## 2020-10-12 DIAGNOSIS — Z299 Encounter for prophylactic measures, unspecified: Secondary | ICD-10-CM | POA: Diagnosis not present

## 2020-10-21 DIAGNOSIS — I10 Essential (primary) hypertension: Secondary | ICD-10-CM | POA: Diagnosis not present

## 2020-10-22 DIAGNOSIS — I129 Hypertensive chronic kidney disease with stage 1 through stage 4 chronic kidney disease, or unspecified chronic kidney disease: Secondary | ICD-10-CM | POA: Diagnosis not present

## 2020-10-22 DIAGNOSIS — E7849 Other hyperlipidemia: Secondary | ICD-10-CM | POA: Diagnosis not present

## 2020-10-22 DIAGNOSIS — N183 Chronic kidney disease, stage 3 unspecified: Secondary | ICD-10-CM | POA: Diagnosis not present

## 2020-11-22 DIAGNOSIS — I10 Essential (primary) hypertension: Secondary | ICD-10-CM | POA: Diagnosis not present

## 2020-12-20 DIAGNOSIS — I10 Essential (primary) hypertension: Secondary | ICD-10-CM | POA: Diagnosis not present

## 2020-12-20 DIAGNOSIS — N183 Chronic kidney disease, stage 3 unspecified: Secondary | ICD-10-CM | POA: Diagnosis not present

## 2020-12-20 DIAGNOSIS — I129 Hypertensive chronic kidney disease with stage 1 through stage 4 chronic kidney disease, or unspecified chronic kidney disease: Secondary | ICD-10-CM | POA: Diagnosis not present

## 2020-12-20 DIAGNOSIS — E7849 Other hyperlipidemia: Secondary | ICD-10-CM | POA: Diagnosis not present

## 2021-01-20 DIAGNOSIS — I10 Essential (primary) hypertension: Secondary | ICD-10-CM | POA: Diagnosis not present

## 2021-02-15 DIAGNOSIS — M549 Dorsalgia, unspecified: Secondary | ICD-10-CM | POA: Diagnosis not present

## 2021-02-15 DIAGNOSIS — Z299 Encounter for prophylactic measures, unspecified: Secondary | ICD-10-CM | POA: Diagnosis not present

## 2021-02-15 DIAGNOSIS — I10 Essential (primary) hypertension: Secondary | ICD-10-CM | POA: Diagnosis not present

## 2021-02-15 DIAGNOSIS — I739 Peripheral vascular disease, unspecified: Secondary | ICD-10-CM | POA: Diagnosis not present

## 2021-02-21 ENCOUNTER — Encounter (HOSPITAL_COMMUNITY): Payer: Self-pay | Admitting: *Deleted

## 2021-02-21 ENCOUNTER — Emergency Department (HOSPITAL_COMMUNITY)
Admission: EM | Admit: 2021-02-21 | Discharge: 2021-02-21 | Disposition: A | Discharge status: Home / Self Care | Payer: Medicare Other | Attending: Emergency Medicine | Admitting: Emergency Medicine

## 2021-02-21 DIAGNOSIS — H409 Unspecified glaucoma: Secondary | ICD-10-CM | POA: Diagnosis not present

## 2021-02-21 DIAGNOSIS — I10 Essential (primary) hypertension: Secondary | ICD-10-CM | POA: Diagnosis not present

## 2021-02-21 DIAGNOSIS — H40212 Acute angle-closure glaucoma, left eye: Secondary | ICD-10-CM | POA: Diagnosis not present

## 2021-02-21 DIAGNOSIS — R000 Tachycardia, unspecified: Secondary | ICD-10-CM | POA: Insufficient documentation

## 2021-02-21 DIAGNOSIS — Z87891 Personal history of nicotine dependence: Secondary | ICD-10-CM | POA: Diagnosis not present

## 2021-02-21 DIAGNOSIS — R509 Fever, unspecified: Secondary | ICD-10-CM | POA: Diagnosis not present

## 2021-02-21 DIAGNOSIS — Z8546 Personal history of malignant neoplasm of prostate: Secondary | ICD-10-CM | POA: Insufficient documentation

## 2021-02-21 DIAGNOSIS — H5711 Ocular pain, right eye: Secondary | ICD-10-CM | POA: Diagnosis present

## 2021-02-21 LAB — CBC WITH DIFFERENTIAL/PLATELET
Abs Immature Granulocytes: 0.09 10*3/uL — ABNORMAL HIGH (ref 0.00–0.07)
Basophils Absolute: 0 10*3/uL (ref 0.0–0.1)
Basophils Relative: 0 %
Eosinophils Absolute: 0 10*3/uL (ref 0.0–0.5)
Eosinophils Relative: 0 %
HCT: 39.9 % (ref 39.0–52.0)
Hemoglobin: 12.9 g/dL — ABNORMAL LOW (ref 13.0–17.0)
Immature Granulocytes: 1 %
Lymphocytes Relative: 7 %
Lymphs Abs: 1.2 10*3/uL (ref 0.7–4.0)
MCH: 29.1 pg (ref 26.0–34.0)
MCHC: 32.3 g/dL (ref 30.0–36.0)
MCV: 90.1 fL (ref 80.0–100.0)
Monocytes Absolute: 1.3 10*3/uL — ABNORMAL HIGH (ref 0.1–1.0)
Monocytes Relative: 8 %
Neutro Abs: 14.1 10*3/uL — ABNORMAL HIGH (ref 1.7–7.7)
Neutrophils Relative %: 84 %
Platelets: 604 10*3/uL — ABNORMAL HIGH (ref 150–400)
RBC: 4.43 MIL/uL (ref 4.22–5.81)
RDW: 13.1 % (ref 11.5–15.5)
WBC: 16.7 10*3/uL — ABNORMAL HIGH (ref 4.0–10.5)
nRBC: 0 % (ref 0.0–0.2)

## 2021-02-21 LAB — COMPREHENSIVE METABOLIC PANEL
ALT: 49 U/L — ABNORMAL HIGH (ref 0–44)
AST: 17 U/L (ref 15–41)
Albumin: 3 g/dL — ABNORMAL LOW (ref 3.5–5.0)
Alkaline Phosphatase: 191 U/L — ABNORMAL HIGH (ref 38–126)
Anion gap: 12 (ref 5–15)
BUN: 12 mg/dL (ref 8–23)
CO2: 26 mmol/L (ref 22–32)
Calcium: 8.5 mg/dL — ABNORMAL LOW (ref 8.9–10.3)
Chloride: 101 mmol/L (ref 98–111)
Creatinine, Ser: 0.88 mg/dL (ref 0.61–1.24)
GFR, Estimated: >60 mL/min (ref >60)
Glucose, Bld: 125 mg/dL — ABNORMAL HIGH (ref 70–99)
Potassium: 3.3 mmol/L — ABNORMAL LOW (ref 3.5–5.1)
Sodium: 139 mmol/L (ref 135–145)
Total Bilirubin: 1 mg/dL (ref 0.3–1.2)
Total Protein: 8 g/dL (ref 6.5–8.1)

## 2021-02-21 LAB — LIPASE, BLOOD: Lipase: 23 U/L (ref 11–51)

## 2021-02-21 LAB — URINALYSIS, ROUTINE W REFLEX MICROSCOPIC
Bacteria, UA: NONE SEEN
Bilirubin Urine: NEGATIVE
Glucose, UA: NEGATIVE mg/dL
Ketones, ur: 20 mg/dL — AB
Leukocytes,Ua: NEGATIVE
Nitrite: NEGATIVE
Protein, ur: 30 mg/dL — AB
Specific Gravity, Urine: 1.01 (ref 1.005–1.030)
pH: 7 (ref 5.0–8.0)

## 2021-02-21 LAB — LACTIC ACID, PLASMA
Lactic Acid, Venous: 0.9 mmol/L (ref 0.5–1.9)
Lactic Acid, Venous: 1 mmol/L (ref 0.5–1.9)

## 2021-02-21 MED ORDER — TIMOLOL MALEATE 0.5 % OP SOLN
1.0000 [drp] | Freq: Once | OPHTHALMIC | Status: AC
  Administered 2021-02-21: 1 [drp] via OPHTHALMIC
  Filled 2021-02-21: qty 5

## 2021-02-21 MED ORDER — APRACLONIDINE HCL 0.5 % OP SOLN
2.0000 [drp] | Freq: Once | OPHTHALMIC | Status: DC
  Filled 2021-02-21: qty 5

## 2021-02-21 MED ORDER — TETRACAINE HCL 0.5 % OP SOLN
2.0000 [drp] | Freq: Once | OPHTHALMIC | Status: AC
  Administered 2021-02-21: 2 [drp] via OPHTHALMIC

## 2021-02-21 MED ORDER — APRACLONIDINE HCL 1 % OP SOLN: 1.0000 [drp] | Freq: Once | OPHTHALMIC | Status: DC

## 2021-02-21 MED ORDER — PILOCARPINE HCL 2 % OP SOLN
2.0000 [drp] | Freq: Once | OPHTHALMIC | Status: AC
  Administered 2021-02-21: 2 [drp] via OPHTHALMIC
  Filled 2021-02-21: qty 15

## 2021-02-21 MED ORDER — SODIUM CHLORIDE 0.9 % IV BOLUS
500.0000 mL | Freq: Once | INTRAVENOUS | Status: AC
  Administered 2021-02-21: 500 mL via INTRAVENOUS

## 2021-02-21 MED ORDER — ACETAZOLAMIDE 250 MG PO TABS: 500.0000 mg | ORAL_TABLET | Freq: Two times a day (BID) | ORAL | 0 refills | Status: DC

## 2021-02-21 MED ORDER — ACETAZOLAMIDE 250 MG PO TABS
500.0000 mg | ORAL_TABLET | Freq: Once | ORAL | Status: AC
  Administered 2021-02-21: 500 mg via ORAL
  Filled 2021-02-21: qty 2

## 2021-02-21 MED ORDER — BRIMONIDINE TARTRATE 0.2 % OP SOLN
1.0000 [drp] | Freq: Once | OPHTHALMIC | Status: AC
  Administered 2021-02-21: 1 [drp] via OPHTHALMIC
  Filled 2021-02-21: qty 5

## 2021-02-21 MED ORDER — LABETALOL HCL 5 MG/ML IV SOLN
10.0000 mg | Freq: Once | INTRAVENOUS | Status: AC
  Administered 2021-02-21: 10 mg via INTRAVENOUS
  Filled 2021-02-21: qty 4

## 2021-02-21 NOTE — ED Notes (Signed)
Re paged opthalmology

## 2021-02-21 NOTE — Discharge Instructions (Signed)
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  This may require surgery to fix and if it gets worse can cause permanent vision loss.  I spoke with the ophthalmologist and they would like you to call the phone number [phone] first thing tomorrow to schedule a follow up appointment tomorrow.   You need to make this appointment without fail.   I have called in a prescription for the pill that we gave you here and will send you home with the eyedrops.   Brimonidine: 1 drop in the left eye every 8 hours  Timolol: 1 drop in the left eye every 12 hours

## 2021-02-21 NOTE — ED Notes (Signed)
20/20 right eye 20/20 left eye

## 2021-02-21 NOTE — ED Triage Notes (Signed)
Left eye pain

## 2021-02-21 NOTE — ED Provider Notes (Signed)
Emergency Department Provider Note   I have reviewed the triage vital signs and the nursing notes.   HISTORY  Chief Complaint Eye Problem   HPI Curtis Marquez is a 75 y.o. male with past medical history reviewed below presents to the emergency department with left eye pain and decreased vision since yesterday.  He reports severe pain and photophobia in the left eye which is gradually worsening.  He feels that his vision is blurry and very different from prior.  He arrives with a fever but was not aware of this.  He denies severe headaches, cough, congestion, abdominal pain, vomiting, diarrhea.  No prior history of glaucoma.  He notes that he was feeling badly today and did not take his blood pressure medication but is typically compliant.   Past Medical History:  Diagnosis Date  . Brain tumor ([place])   . GERD (gastroesophageal reflux disease)   . Hypercholesteremia   . Hypertension   . Prostate cancer ([place]) 11/13/13    Patient Active Problem List   Diagnosis Date Noted  . Dysphagia   . Food impaction of esophagus 03/18/2019  . Hypokalemia 07/10/2016  . Hypertensive urgency 02/09/2016  . GERD (gastroesophageal reflux disease) 02/09/2016  . Blunt abdominal trauma 02/09/2016  . Syncope   . Prostate cancer ([place]) 11/13/2013    Past Surgical History:  Procedure Laterality Date  . BRAIN TUMOR EXCISION  1994  . ESOPHAGOGASTRODUODENOSCOPY N/A 03/18/2019   Procedure: ESOPHAGOGASTRODUODENOSCOPY (EGD);  Surgeon: Danie Binder, MD;  Location: [facility];  Service: Endoscopy;  Laterality: N/A;  . ESOPHAGOGASTRODUODENOSCOPY N/A 03/24/2019   Procedure: ESOPHAGOGASTRODUODENOSCOPY (EGD);  Surgeon: Danie Binder, MD;  Location: [facility];  Service: Endoscopy;  Laterality: N/A;  8:30AM - pt had Covid test done in ED & has not been anywhere since, office spoke with him and he understands to remain quarantined  . LYMPHADENECTOMY Bilateral 11/13/2013   Procedure: LYMPHADENECTOMY "PELVIC  LYMPH NODE DISSECTION";  Surgeon: Dutch Gray, MD;  Location: [facility];  Service: Urology;  Laterality: Bilateral;  . ROBOT ASSISTED LAPAROSCOPIC RADICAL PROSTATECTOMY N/A 11/13/2013   Procedure: ROBOTIC ASSISTED LAPAROSCOPIC RADICAL PROSTATECTOMY LEVEL 2;  Surgeon: Dutch Gray, MD;  Location: [facility];  Service: Urology;  Laterality: N/A;  . SAVORY DILATION N/A 03/24/2019   Procedure: SAVORY DILATION;  Surgeon: Danie Binder, MD;  Location: [facility];  Service: Endoscopy;  Laterality: N/A;    Allergies Patient has no known allergies.  Family History  Problem Relation Age of Onset  . CVA Father   . Diabetes Mother   . Hypertension Mother   . Diabetes Sister   . Hypertension Sister   . Cancer Brother     Social History Social History   Tobacco Use  . Smoking status: Former Research scientist (life sciences)  . Smokeless tobacco: Never Used  Vaping Use  . Vaping Use: Never used  Substance Use Topics  . Alcohol use: No    Alcohol/week: 0.0 standard drinks  . Drug use: No    Review of Systems  Constitutional: No fever/chills Eyes: Positive vision change and left eye redness/pain.  ENT: No sore throat. Cardiovascular: Denies chest pain. Respiratory: Denies shortness of breath. Gastrointestinal: No abdominal pain.  No nausea, no vomiting.  No diarrhea.  No constipation. Genitourinary: Negative for dysuria. Musculoskeletal: Negative for back pain. Skin: Negative for rash. Neurological: Negative for headaches, focal weakness or numbness.  10-point ROS otherwise negative.  ____________________________________________   PHYSICAL EXAM:  VITAL SIGNS: ED Triage Vitals  Enc  Vitals Group     BP 02/21/21 1852 (!) 214/109     Pulse Rate 02/21/21 1852 (!) 121     Resp 02/21/21 1852 16     Temp 02/21/21 1852 (!) 100.7 F (38.2 C)     Temp Source 02/21/21 1852 Oral     SpO2 02/21/21 1852 96 %   Constitutional: Alert and oriented. Well appearing and in no acute distress. Eyes: Injected conjunctive a on  the left with tearing.  Patient is preferentially keeping the eye closed. IOP R: 20 and L: 43. No significant periorbital swelling or face cellulitis.  Head: Atraumatic. Nose: No congestion/rhinnorhea. Mouth/Throat: Mucous membranes are moist.  Oropharynx non-erythematous. Neck: No stridor.   Cardiovascular: Tachycardia. Good peripheral circulation. Grossly normal heart sounds.   Respiratory: Normal respiratory effort.  No retractions. Lungs CTAB. Gastrointestinal: Soft and nontender. No distention.  Musculoskeletal: No lower extremity tenderness nor edema. No gross deformities of extremities. Neurologic:  Normal speech and language. 5/5 strength in the bilateral upper and lower extremities. Normal sensation. No facial asymmetry.  Skin:  Skin is warm, dry and intact. No rash noted.   ____________________________________________   LABS (all labs ordered are listed, but only abnormal results are displayed)  Labs Reviewed  COMPREHENSIVE METABOLIC PANEL - Abnormal; Notable for the following components:      Result Value   Potassium 3.3 (*)    Glucose, Bld 125 (*)    Calcium 8.5 (*)    Albumin 3.0 (*)    ALT 49 (*)    Alkaline Phosphatase 191 (*)    All other components within normal limits  CBC WITH DIFFERENTIAL/PLATELET - Abnormal; Notable for the following components:   WBC 16.7 (*)    Hemoglobin 12.9 (*)    Platelets 604 (*)    Neutro Abs 14.1 (*)    Monocytes Absolute 1.3 (*)    Abs Immature Granulocytes 0.09 (*)    All other components within normal limits  URINALYSIS, ROUTINE W REFLEX MICROSCOPIC - Abnormal; Notable for the following components:   Hgb urine dipstick SMALL (*)    Ketones, ur 20 (*)    Protein, ur 30 (*)    All other components within normal limits  CULTURE, BLOOD (ROUTINE X 2)  CULTURE, BLOOD (ROUTINE X 2)  URINE CULTURE  RESP PANEL BY RT-PCR (FLU A&B, COVID) ARPGX2  LIPASE, BLOOD  LACTIC ACID, PLASMA  LACTIC ACID, PLASMA    ____________________________________________   PROCEDURES  Procedure(s) performed:   Procedures  CRITICAL CARE Performed by: Margette Fast Total critical care time: 35 minutes Critical care time was exclusive of separately billable procedures and treating other patients. Critical care was necessary to treat or prevent imminent or life-threatening deterioration. Critical care was time spent personally by me on the following activities: development of treatment plan with patient and/or surrogate as well as nursing, discussions with consultants, evaluation of patient's response to treatment, examination of patient, obtaining history from patient or surrogate, ordering and performing treatments and interventions, ordering and review of laboratory studies, ordering and review of radiographic studies, pulse oximetry and re-evaluation of patient's condition.  Nanda Quinton, MD Emergency Medicine  ____________________________________________   INITIAL IMPRESSION / ASSESSMENT AND PLAN / ED COURSE  Pertinent labs & imaging results that were available during my care of the patient were reviewed by me and considered in my medical decision making (see chart for details).   Patient presents emergency department with left eye pain starting yesterday with decreased visual acuity.  He  has some tearing with injected conjunctive and elevated intraocular pressure assessment.  I did repeat the value 3 times all in the mid 40 range.  Normal pressures on the right.  Concern for acute angle glaucoma and ophthalmology on-call paged.  I have ordered eyedrops as well as acetazolamide.  Patient is febrile and tachycardic here.  He is also very hypertensive but notes he did not take his blood pressure medicine today.  No clear source for fever.  Patient does not have periorbital swelling or face cellulitis.  He does have pain with extraocular movements but suspect this is likely due to more of a glaucoma process.   Will discuss further with ophthalmology.   08:59 PM  Left eye pressure reduced 30 minutes after drops were given now at 29 down from 43.  Patient is symptomatically much better.  His vision is significantly improved.  He is able to open his eye and the well lit room and look around without pain. Have re-paged ophthalmology to discuss.   10:35 PM  Have paged ophthalmology multiple times both directly and have contacted [facility] for assistance.  I have not received a call back.  The patient's eye pressure continues to normalize and his vision is currently 20/20.  I will go through the [facility] transfer center as I do need ophthalmology assistance with this case.  11:00 PM  After going through the [facility] transfer center I was able to speak with Dr. Jimmey Ralph with ophthalmology.  He advised over the phone.  Patient's symptoms have completely resolved. IOP returned to normal. No clear source for fever. No active HA. No respiratory symptoms. Advises that he can f/u in their urgent clinic in the AM. Patient will call in the AM. Sent home with drops from the ED and Rx for diamox. Patient to take his BP meds at home.  ____________________________________________  FINAL CLINICAL IMPRESSION(S) / ED DIAGNOSES  Final diagnoses:  Acute glaucoma of left eye     MEDICATIONS GIVEN DURING THIS VISIT:  Medications  tetracaine (PONTOCAINE) 0.5 % ophthalmic solution 2 drop (2 drops Both Eyes Given by Other 02/21/21 2003)  sodium chloride 0.9 % bolus 500 mL (0 mLs Intravenous Stopped 02/21/21 2023)  timolol (TIMOPTIC) 0.5 % ophthalmic solution 1 drop (1 drop Left Eye Given 02/21/21 2024)  pilocarpine (PILOCAR) 2 % ophthalmic solution 2 drop (2 drops Left Eye Given 02/21/21 2024)  acetaZOLAMIDE (DIAMOX) tablet 500 mg (500 mg Oral Given 02/21/21 2024)  brimonidine (ALPHAGAN) 0.2 % ophthalmic solution 1 drop (1 drop Left Eye Given 02/21/21 2024)  labetalol (NORMODYNE) injection 10 mg (10 mg Intravenous Given 02/21/21  2038)     NEW OUTPATIENT MEDICATIONS STARTED DURING THIS VISIT:  Discharge Medication List as of 02/21/2021 11:03 PM    START taking these medications   Details  acetaZOLAMIDE (DIAMOX) 250 MG tablet Take 2 tablets (500 mg total) by mouth 2 (two) times daily for 2 days., Starting Mon 02/21/2021, Until Wed 02/23/2021, Normal        Note:  This document was prepared using Dragon voice recognition software and may include unintentional dictation errors.  Nanda Quinton, MD, [facility] Emergency Medicine    Delmar Arriaga, [facility], MD 02/21/21 [phone]

## 2021-02-21 NOTE — ED Notes (Signed)
Ophthalmology paged at [place] at this time Curtis Marquez

## 2021-02-22 DIAGNOSIS — H40031 Anatomical narrow angle, right eye: Secondary | ICD-10-CM | POA: Diagnosis not present

## 2021-02-24 ENCOUNTER — Other Ambulatory Visit: Payer: Self-pay

## 2021-02-24 ENCOUNTER — Inpatient Hospital Stay (HOSPITAL_COMMUNITY)
Admission: EM | Admit: 2021-02-24 | Discharge: 2021-03-02 | DRG: 516 | Disposition: A | Discharge status: Home / Self Care | Payer: Medicare Other | Attending: Internal Medicine | Admitting: Internal Medicine

## 2021-02-24 ENCOUNTER — Emergency Department (HOSPITAL_COMMUNITY): Payer: Medicare Other

## 2021-02-24 ENCOUNTER — Encounter (HOSPITAL_COMMUNITY): Payer: Self-pay | Admitting: *Deleted

## 2021-02-24 DIAGNOSIS — I119 Hypertensive heart disease without heart failure: Secondary | ICD-10-CM | POA: Diagnosis present

## 2021-02-24 DIAGNOSIS — I6523 Occlusion and stenosis of bilateral carotid arteries: Secondary | ICD-10-CM | POA: Diagnosis not present

## 2021-02-24 DIAGNOSIS — D75839 Thrombocytosis, unspecified: Secondary | ICD-10-CM | POA: Diagnosis not present

## 2021-02-24 DIAGNOSIS — E8809 Other disorders of plasma-protein metabolism, not elsewhere classified: Secondary | ICD-10-CM | POA: Diagnosis not present

## 2021-02-24 DIAGNOSIS — I776 Arteritis, unspecified: Secondary | ICD-10-CM | POA: Diagnosis not present

## 2021-02-24 DIAGNOSIS — D72828 Other elevated white blood cell count: Secondary | ICD-10-CM | POA: Diagnosis not present

## 2021-02-24 DIAGNOSIS — K219 Gastro-esophageal reflux disease without esophagitis: Secondary | ICD-10-CM | POA: Diagnosis present

## 2021-02-24 DIAGNOSIS — R531 Weakness: Secondary | ICD-10-CM

## 2021-02-24 DIAGNOSIS — R4182 Altered mental status, unspecified: Secondary | ICD-10-CM

## 2021-02-24 DIAGNOSIS — I313 Pericardial effusion (noninflammatory): Secondary | ICD-10-CM | POA: Diagnosis not present

## 2021-02-24 DIAGNOSIS — Z9079 Acquired absence of other genital organ(s): Secondary | ICD-10-CM

## 2021-02-24 DIAGNOSIS — Z87891 Personal history of nicotine dependence: Secondary | ICD-10-CM

## 2021-02-24 DIAGNOSIS — I7789 Other specified disorders of arteries and arterioles: Secondary | ICD-10-CM | POA: Diagnosis not present

## 2021-02-24 DIAGNOSIS — D72829 Elevated white blood cell count, unspecified: Secondary | ICD-10-CM

## 2021-02-24 DIAGNOSIS — N179 Acute kidney failure, unspecified: Secondary | ICD-10-CM | POA: Diagnosis not present

## 2021-02-24 DIAGNOSIS — R7401 Elevation of levels of liver transaminase levels: Secondary | ICD-10-CM | POA: Diagnosis not present

## 2021-02-24 DIAGNOSIS — Z86011 Personal history of benign neoplasm of the brain: Secondary | ICD-10-CM | POA: Diagnosis not present

## 2021-02-24 DIAGNOSIS — H209 Unspecified iridocyclitis: Secondary | ICD-10-CM | POA: Diagnosis present

## 2021-02-24 DIAGNOSIS — E782 Mixed hyperlipidemia: Secondary | ICD-10-CM | POA: Diagnosis not present

## 2021-02-24 DIAGNOSIS — H409 Unspecified glaucoma: Secondary | ICD-10-CM | POA: Diagnosis not present

## 2021-02-24 DIAGNOSIS — E785 Hyperlipidemia, unspecified: Secondary | ICD-10-CM | POA: Diagnosis present

## 2021-02-24 DIAGNOSIS — I10 Essential (primary) hypertension: Secondary | ICD-10-CM | POA: Diagnosis not present

## 2021-02-24 DIAGNOSIS — R509 Fever, unspecified: Secondary | ICD-10-CM

## 2021-02-24 DIAGNOSIS — K529 Noninfective gastroenteritis and colitis, unspecified: Secondary | ICD-10-CM | POA: Diagnosis not present

## 2021-02-24 DIAGNOSIS — K5000 Crohn's disease of small intestine without complications: Secondary | ICD-10-CM | POA: Diagnosis not present

## 2021-02-24 DIAGNOSIS — K7689 Other specified diseases of liver: Secondary | ICD-10-CM | POA: Diagnosis not present

## 2021-02-24 DIAGNOSIS — Z8249 Family history of ischemic heart disease and other diseases of the circulatory system: Secondary | ICD-10-CM

## 2021-02-24 DIAGNOSIS — A419 Sepsis, unspecified organism: Secondary | ICD-10-CM | POA: Diagnosis not present

## 2021-02-24 DIAGNOSIS — Z8546 Personal history of malignant neoplasm of prostate: Secondary | ICD-10-CM | POA: Diagnosis not present

## 2021-02-24 DIAGNOSIS — D649 Anemia, unspecified: Secondary | ICD-10-CM | POA: Diagnosis present

## 2021-02-24 DIAGNOSIS — R7989 Other specified abnormal findings of blood chemistry: Secondary | ICD-10-CM | POA: Diagnosis not present

## 2021-02-24 DIAGNOSIS — Z20822 Contact with and (suspected) exposure to covid-19: Secondary | ICD-10-CM | POA: Diagnosis present

## 2021-02-24 DIAGNOSIS — R410 Disorientation, unspecified: Secondary | ICD-10-CM | POA: Diagnosis not present

## 2021-02-24 DIAGNOSIS — K50019 Crohn's disease of small intestine with unspecified complications: Secondary | ICD-10-CM | POA: Diagnosis not present

## 2021-02-24 DIAGNOSIS — Z79899 Other long term (current) drug therapy: Secondary | ICD-10-CM

## 2021-02-24 DIAGNOSIS — I771 Stricture of artery: Secondary | ICD-10-CM | POA: Diagnosis not present

## 2021-02-24 LAB — CBC WITH DIFFERENTIAL/PLATELET
Abs Immature Granulocytes: 0.11 10*3/uL — ABNORMAL HIGH (ref 0.00–0.07)
Basophils Absolute: 0.1 10*3/uL (ref 0.0–0.1)
Basophils Relative: 0 %
Eosinophils Absolute: 0 10*3/uL (ref 0.0–0.5)
Eosinophils Relative: 0 %
HCT: 37.7 % — ABNORMAL LOW (ref 39.0–52.0)
Hemoglobin: 12.1 g/dL — ABNORMAL LOW (ref 13.0–17.0)
Immature Granulocytes: 1 %
Lymphocytes Relative: 8 %
Lymphs Abs: 1.2 10*3/uL (ref 0.7–4.0)
MCH: 29.1 pg (ref 26.0–34.0)
MCHC: 32.1 g/dL (ref 30.0–36.0)
MCV: 90.6 fL (ref 80.0–100.0)
Monocytes Absolute: 1.1 10*3/uL — ABNORMAL HIGH (ref 0.1–1.0)
Monocytes Relative: 7 %
Neutro Abs: 13.2 10*3/uL — ABNORMAL HIGH (ref 1.7–7.7)
Neutrophils Relative %: 84 %
Platelets: 641 10*3/uL — ABNORMAL HIGH (ref 150–400)
RBC: 4.16 MIL/uL — ABNORMAL LOW (ref 4.22–5.81)
RDW: 13.3 % (ref 11.5–15.5)
WBC: 15.7 10*3/uL — ABNORMAL HIGH (ref 4.0–10.5)
nRBC: 0 % (ref 0.0–0.2)

## 2021-02-24 LAB — COMPREHENSIVE METABOLIC PANEL
ALT: 126 U/L — ABNORMAL HIGH (ref 0–44)
AST: 95 U/L — ABNORMAL HIGH (ref 15–41)
Albumin: 2.4 g/dL — ABNORMAL LOW (ref 3.5–5.0)
Alkaline Phosphatase: 253 U/L — ABNORMAL HIGH (ref 38–126)
Anion gap: 10 (ref 5–15)
BUN: 35 mg/dL — ABNORMAL HIGH (ref 8–23)
CO2: 23 mmol/L (ref 22–32)
Calcium: 8.6 mg/dL — ABNORMAL LOW (ref 8.9–10.3)
Chloride: 106 mmol/L (ref 98–111)
Creatinine, Ser: 1.61 mg/dL — ABNORMAL HIGH (ref 0.61–1.24)
GFR, Estimated: 45 mL/min — ABNORMAL LOW (ref >60)
Glucose, Bld: 110 mg/dL — ABNORMAL HIGH (ref 70–99)
Potassium: 3.6 mmol/L (ref 3.5–5.1)
Sodium: 139 mmol/L (ref 135–145)
Total Bilirubin: 1 mg/dL (ref 0.3–1.2)
Total Protein: 7.3 g/dL (ref 6.5–8.1)

## 2021-02-24 LAB — LACTIC ACID, PLASMA
Lactic Acid, Venous: 1.2 mmol/L (ref 0.5–1.9)
Lactic Acid, Venous: 1.2 mmol/L (ref 0.5–1.9)

## 2021-02-24 LAB — APTT: aPTT: 31 seconds (ref 24–36)

## 2021-02-24 LAB — RESP PANEL BY RT-PCR (FLU A&B, COVID) ARPGX2
Influenza A by PCR: NEGATIVE
Influenza B by PCR: NEGATIVE
SARS Coronavirus 2 by RT PCR: NEGATIVE

## 2021-02-24 LAB — PROTIME-INR
INR: 1.2 (ref 0.8–1.2)
Prothrombin Time: 15.6 seconds — ABNORMAL HIGH (ref 11.4–15.2)

## 2021-02-24 LAB — POC OCCULT BLOOD, ED: Fecal Occult Bld: NEGATIVE

## 2021-02-24 MED ORDER — PIPERACILLIN-TAZOBACTAM 4.5 G IVPB: 4.5000 g | Freq: Once | INTRAVENOUS | Status: DC

## 2021-02-24 MED ORDER — LACTATED RINGERS IV BOLUS
1000.0000 mL | Freq: Once | INTRAVENOUS | Status: AC
  Administered 2021-02-24: 1000 mL via INTRAVENOUS

## 2021-02-24 MED ORDER — IOHEXOL 300 MG/ML  SOLN
100.0000 mL | Freq: Once | INTRAMUSCULAR | Status: AC | PRN
  Administered 2021-02-24: 75 mL via INTRAVENOUS

## 2021-02-24 NOTE — ED Triage Notes (Signed)
Weakness , loss of appetite , fever.

## 2021-02-24 NOTE — ED Provider Notes (Signed)
[facility] EMERGENCY DEPARTMENT Provider Note  CSN: [id number] Arrival date & time: 02/24/21 1639    History Chief Complaint  Patient presents with  . Altered Mental Status    HPI  Curtis Marquez is a 75 y.o. male with prior history of brain tumor resection in 1994 and HTN brought to the ED by family for evaluation of weakness. He was seen in the ED 3 days ago for L eye pain, noted to have increased IOP and given treatment for acute glaucoma then. His pressure normalized during the ED visit and, after discussion with Ophtho at [facility], he was ultimately discharged home with plan for outpatient follow up, although I cannot see that this has occurred yet. . During his ED visit he was noted to have a fever, WBC was elevated but no other signs of infection were identified. He had a Covid/Flu swab ordered but it doesn't appear to have been collected prior to him leaving the hospital. Per triage, family who brought him reports he has had progressive weakness and confusion with poor appetite. He is unable to give any additional history here.    Past Medical History:  Diagnosis Date  . Brain tumor ([place])   . GERD (gastroesophageal reflux disease)   . Hypercholesteremia   . Hypertension   . Prostate cancer ([place]) 11/13/13    Past Surgical History:  Procedure Laterality Date  . BRAIN TUMOR EXCISION  1994  . ESOPHAGOGASTRODUODENOSCOPY N/A 03/18/2019   Procedure: ESOPHAGOGASTRODUODENOSCOPY (EGD);  Surgeon: Danie Binder, MD;  Location: [facility] END[facility] SUITE;  Service: Endoscopy;  Laterality: N/A;  . ESOPHAGOGASTRODUODENOSCOPY N/A 03/24/2019   Procedure: ESOPHAGOGASTRODUODENOSCOPY (EGD);  Surgeon: Danie Binder, MD;  Location: [facility] ENDO SUITE;  Service: Endoscopy;  Laterality: N/A;  8:30AM - pt had Covid test done in ED & has not been anywhere since, office spoke with him and he understands to remain quarantined  . LYMPHADENECTOMY Bilateral 11/13/2013   Procedure: LYMPHADENECTOMY "PELVIC LYMPH NODE  DISSECTION";  Surgeon: Dutch Gray, MD;  Location: [facility] ORS;  Service: Urology;  Laterality: Bilateral;  . ROBOT ASSISTED LAPAROSCOPIC RADICAL PROSTATECTOMY N/A 11/13/2013   Procedure: ROBOTIC ASSISTED LAPAROSCOPIC RADICAL PROSTATECTOMY LEVEL 2;  Surgeon: Dutch Gray, MD;  Location: [facility] ORS;  Service: Urology;  Laterality: N/A;  . SAVORY DILATION N/A 03/24/2019   Procedure: SAVORY DILATION;  Surgeon: Danie Binder, MD;  Location: [facility];  Service: Endoscopy;  Laterality: N/A;    Family History  Problem Relation Age of Onset  . CVA Father   . Diabetes Mother   . Hypertension Mother   . Diabetes Sister   . Hypertension Sister   . Cancer Brother     Social History   Tobacco Use  . Smoking status: Former Research scientist (life sciences)  . Smokeless tobacco: Never Used  Vaping Use  . Vaping Use: Never used  Substance Use Topics  . Alcohol use: No    Alcohol/week: 0.0 standard drinks  . Drug use: No     Home Medications Prior to Admission medications   Medication Sig Start Date End Date Taking? Authorizing Provider  amLODipine (NORVASC) 5 MG tablet Take 1 tablet by mouth daily. 02/15/21  Yes [provider]  brimonidine (ALPHAGAN) 0.2 % ophthalmic solution Place 1 drop into the left eye in the morning and at bedtime.   Yes [provider]  cloNIDine (CATAPRES) 0.3 MG tablet Take 0.3 mg by mouth 2 (two) times daily. 01/15/21  Yes [provider]  levocetirizine (XYZAL) 5 MG tablet  Take 5 mg by mouth every evening.   Yes [provider]  olmesartan (BENICAR) 40 MG tablet Take 40 mg by mouth daily. 02/10/19  Yes [provider]  prednisoLONE acetate (PRED FORTE) 1 % ophthalmic suspension Place 1 drop into the left eye 4 (four) times daily. 02/22/21  Yes [provider]  timolol (BETIMOL) 0.5 % ophthalmic solution Place 1 drop into the left eye 2 (two) times daily.   Yes [provider]  acetaZOLAMIDE (DIAMOX) 250 MG tablet Take 2 tablets (500 mg  total) by mouth 2 (two) times daily for 2 days. Patient not taking: Reported on 02/24/2021 02/21/21 02/23/21  Margette Fast, MD  amoxicillin (AMOXIL) 500 MG tablet 2 PO BID FOR 10 DAYS Patient not taking: Reported on 02/24/2021 03/24/19   Danie Binder, MD  buPROPion [facility] SR) 100 MG 12 hr tablet Take 100 mg by mouth daily. Patient not taking: Reported on 02/24/2021 02/04/19   [provider]  clarithromycin (BIAXIN) 500 MG tablet 1 PO BID FOR 10 DAYS. Patient not taking: Reported on 02/24/2021 03/24/19   Danie Binder, MD  clonazePAM (KLONOPIN) 0.5 MG tablet Take 0.5 mg by mouth daily as needed for anxiety.  Patient not taking: Reported on 02/24/2021 02/04/19   [provider]  cloNIDine (CATAPRES) 0.1 MG tablet Take 0.3 mg by mouth 2 (two) times daily. Patient not taking: Reported on 02/24/2021 02/13/19   [provider]  hydrALAZINE (APRESOLINE) 50 MG tablet Take 50 mg by mouth 2 (two) times a day.  Patient not taking: Reported on 02/24/2021 01/28/19   [provider]  losartan (COZAAR) 100 MG tablet Take 100 mg by mouth daily.  Patient not taking: Reported on 02/24/2021    [provider]  meloxicam (MOBIC) 15 MG tablet Take 1 tablet by mouth daily. Patient not taking: No sig reported 02/15/21   [provider]  omeprazole (PRILOSEC) 20 MG capsule 1 PO 30 mins prior to breakfast and supper Patient not taking: Reported on 02/24/2021 03/18/19   Danie Binder, MD     Allergies    Patient has no known allergies.   Review of Systems   Review of Systems Unable to assess due to mental status.    Physical Exam BP (!) 134/56   Pulse 84   Temp 99.5 F (37.5 C) (Oral)   Resp (!) 22   SpO2 96%   Physical Exam Vitals and nursing note reviewed.  Constitutional:      Appearance: Normal appearance.  HENT:     Head: Normocephalic and atraumatic.     Nose: Nose normal.     Mouth/Throat:     Mouth: Mucous membranes are moist.  Eyes:     Extraocular  Movements: Extraocular movements intact.     Conjunctiva/sclera: Conjunctivae normal.     Pupils: Pupils are equal, round, and reactive to light.  Cardiovascular:     Rate and Rhythm: Normal rate.  Pulmonary:     Effort: Pulmonary effort is normal.     Breath sounds: Normal breath sounds.  Abdominal:     General: Abdomen is flat.     Palpations: Abdomen is soft.     Tenderness: There is no abdominal tenderness.  Musculoskeletal:        General: No swelling. Normal range of motion.     Cervical back: Normal range of motion and neck supple. No rigidity.  Skin:    General: Skin is warm and dry.  Neurological:  General: No focal deficit present.     Mental Status: He is disoriented.     Comments: Slow to respond  Psychiatric:        Mood and Affect: Mood normal.      ED Results / Procedures / Treatments   Labs (all labs ordered are listed, but only abnormal results are displayed) Labs Reviewed  COMPREHENSIVE METABOLIC PANEL - Abnormal; Notable for the following components:      Result Value   Glucose, Bld 110 (*)    BUN 35 (*)    Creatinine, Ser 1.61 (*)    Calcium 8.6 (*)    Albumin 2.4 (*)    AST 95 (*)    ALT 126 (*)    Alkaline Phosphatase 253 (*)    GFR, Estimated 45 (*)    All other components within normal limits  CBC WITH DIFFERENTIAL/PLATELET - Abnormal; Notable for the following components:   WBC 15.7 (*)    RBC 4.16 (*)    Hemoglobin 12.1 (*)    HCT 37.7 (*)    Platelets 641 (*)    Neutro Abs 13.2 (*)    Monocytes Absolute 1.1 (*)    Abs Immature Granulocytes 0.11 (*)    All other components within normal limits  PROTIME-INR - Abnormal; Notable for the following components:   Prothrombin Time 15.6 (*)    All other components within normal limits  CULTURE, BLOOD (ROUTINE X 2)  CULTURE, BLOOD (ROUTINE X 2)  RESP PANEL BY RT-PCR (FLU A&B, COVID) ARPGX2  URINE CULTURE  SARS CORONAVIRUS 2 (TAT 6-24 HRS)  LACTIC ACID, PLASMA  LACTIC ACID, PLASMA   APTT  URINALYSIS, ROUTINE W REFLEX MICROSCOPIC  POC OCCULT BLOOD, ED    EKG EKG Interpretation  Date/Time:  Thursday Feb 24 2021 18:51:27 EDT Ventricular Rate:  61 PR Interval:  150 QRS Duration: 95 QT Interval:  430 QTC Calculation: 434 R Axis:   81 Text Interpretation: Sinus rhythm Borderline right axis deviation Probable anteroseptal infarct, old Since last tracing Rate slower Confirmed by Calvert Cantor [phone]) on 02/24/2021 6:54:50 PM   Radiology CT Head Wo Contrast  Result Date: 02/24/2021 CLINICAL DATA:  Mental status change. Weakness, loss of appetite and fever. EXAM: CT HEAD WITHOUT CONTRAST TECHNIQUE: Contiguous axial images were obtained from the base of the skull through the vertex without intravenous contrast. COMPARISON:  02/09/2016 FINDINGS: Brain: No evidence of acute infarction, hemorrhage, hydrocephalus, extra-axial collection or mass lesion/mass effect. There is mild diffuse low-attenuation within the subcortical and periventricular white matter compatible with chronic microvascular disease. Vascular: No hyperdense vessel or unexpected calcification. Skull: Previous right frontal craniotomy. Negative for fracture or focal lesion. Sinuses/Orbits: No acute finding. Other: None. IMPRESSION: 1. No acute intracranial abnormalities. 2. Chronic small vessel ischemic change and brain atrophy. Electronically Signed   By: Kerby Moors M.D.   On: 02/24/2021 18:54   CT CHEST ABDOMEN PELVIS W CONTRAST  Result Date: 02/24/2021 CLINICAL DATA:  Abdomen pain fever and leukocytosis EXAM: CT CHEST, ABDOMEN, AND PELVIS WITH CONTRAST TECHNIQUE: Multidetector CT imaging of the chest, abdomen and pelvis was performed following the standard protocol during bolus administration of intravenous contrast. CONTRAST:  38m OMNIPAQUE IOHEXOL 300 MG/ML  SOLN COMPARISON:  CT 02/09/2016 FINDINGS: CT CHEST FINDINGS Cardiovascular: Mild aortic atherosclerosis. Indistinct wall thickening at the great vessel  origins aortic arch and proximal descending aorta. No dissection is seen. There is no aneurysmal formation. Mild aortic atherosclerosis. Cardiomegaly. Common origin of the left common carotid artery  and brachiocephalic artery. Cardiomegaly. Trace pericardial effusion. Mediastinum/Nodes: Midline trachea. No thyroid mass. No suspicious adenopathy. Esophagus within normal limits Lungs/Pleura: No consolidation or pneumothorax. No significant pleural effusion Musculoskeletal: No acute or suspicious osseous abnormality. CT ABDOMEN PELVIS FINDINGS Hepatobiliary: Subcentimeter hypodensity within the anterior liver too small to further characterize. No calcified gallstone or biliary dilatation. Pancreas: Unremarkable. No pancreatic ductal dilatation or surrounding inflammatory changes. Spleen: Normal in size without focal abnormality. Adrenals/Urinary Tract: Adrenal glands within normal limits. Kidneys show no hydronephrosis. Cyst in the right kidney. The bladder is unremarkable Stomach/Bowel: The stomach is nonenlarged. Negative appendix. Wall thickening and inflammatory change involving distal small bowel in the pelvis concerning for enteritis. Vascular/Lymphatic: Nonaneurysmal aorta. Mild aortic atherosclerosis. Possible mild mural thickening of the abdominal aorta, external iliac vessels and common femoral vessels. No suspicious nodes. Reproductive: Status post prostatectomy. Other: Small free fluid in the pelvis.  No free air. Musculoskeletal: No acute or suspicious osseous abnormality IMPRESSION: 1. Prominent wall thickening involving distal small bowel in the pelvis with associated inflammatory changes, consistent with enteritis of infectious, inflammatory, or ischemic etiology. 2. Abnormal appearance of the aortic arch and proximal descending thoracic aorta which demonstrates indistinct mural thickening and surrounding inflammatory change, suspicious for aortitis/vasculitis. This could be secondary to infectious  etiologies as well as autoimmune disease. There is no aneurysmal dilatation at this time. 3. Small free fluid in the pelvis Electronically Signed   By: Donavan Foil M.D.   On: 02/24/2021 23:31   DG Chest Port 1 View  Result Date: 02/24/2021 CLINICAL DATA:  Sepsis EXAM: PORTABLE CHEST 1 VIEW COMPARISON:  11/28/2018 FINDINGS: The heart size and mediastinal contours are within normal limits. Both lungs are clear. The visualized skeletal structures are unremarkable. IMPRESSION: No active disease. Electronically Signed   By: Fidela Salisbury MD   On: 02/24/2021 18:04    Procedures Procedures  Medications Ordered in the ED Medications  piperacillin-tazobactam (ZOSYN) IVPB 4.5 g (has no administration in time range)  lactated ringers bolus 1,000 mL (0 mLs Intravenous Stopped 02/24/21 2036)  iohexol (OMNIPAQUE) 300 MG/ML solution 100 mL (75 mLs Intravenous Contrast Given 02/24/21 2247)     MDM Rules/Calculators/A&P MDM Patient with continued fevers, now with decreased appetite, change in mental status. Will re-check labs, CXR, UA and Covid/flu swab.  ED Course  I have reviewed the triage vital signs and the nursing notes.  Pertinent labs & imaging results that were available during my care of the patient were reviewed by me and considered in my medical decision making (see chart for details).  Clinical Course as of 02/25/21 0000  Thu Feb 24, 2021  1741 Spoke with patient's wife who states he has been lying around for 2 days and has had dark stool today. Has not been eating much. He had still been running a fever. He was seen by Ophtho in [place] as an outpatient, continued ED eye drops and added a steroid drop. Family is unaware of any fever at home. He is not usually confused to this extent, usually is talkative and active.  [CS]  0867 CBC shows continued leukocytosis but improved from recent ED visit. CXR is clear.  [CS]  1836 Coags are normal.  [CS]  1853 CMP shows AKI compared to recent ED visit.  Will give a liter of LR. Lactic acid and coags are unremarkable.  [CS]  6195 CT head neg for acute process.  [CS]  2203 Patient continues to rest comfortably. Drinking fluids. No definite source of his  fever. UA is pending but was negative on recent ED visit. Will discuss admission with hospitalist for AKI and further evaluation of fever without sepsis.  [CS]  2228 Spoke with Dr. Josephine Cables, who requests CT to eval occult source of fever and FOBT for reported 'dark stools'. He requests a repage when that has resulted.  [CS]  2359 CT results reviewed with Dr. Josephine Cables who will evaluate for admission. Zosyn for enteritis and fever.  [CS]    Clinical Course User Index [CS] Truddie Hidden, MD    Final Clinical Impression(s) / ED Diagnoses Final diagnoses:  Fever  AKI (acute kidney injury) ([place])  Enteritis    Rx / DC Orders ED Discharge Orders    None       Truddie Hidden, MD 02/25/21 0000

## 2021-02-25 ENCOUNTER — Inpatient Hospital Stay (HOSPITAL_COMMUNITY): Payer: Medicare Other

## 2021-02-25 DIAGNOSIS — I776 Arteritis, unspecified: Secondary | ICD-10-CM | POA: Diagnosis not present

## 2021-02-25 DIAGNOSIS — R410 Disorientation, unspecified: Secondary | ICD-10-CM | POA: Diagnosis not present

## 2021-02-25 DIAGNOSIS — I119 Hypertensive heart disease without heart failure: Secondary | ICD-10-CM | POA: Diagnosis present

## 2021-02-25 DIAGNOSIS — R531 Weakness: Secondary | ICD-10-CM | POA: Diagnosis not present

## 2021-02-25 DIAGNOSIS — K5000 Crohn's disease of small intestine without complications: Secondary | ICD-10-CM | POA: Diagnosis present

## 2021-02-25 DIAGNOSIS — K50019 Crohn's disease of small intestine with unspecified complications: Secondary | ICD-10-CM

## 2021-02-25 DIAGNOSIS — Z86011 Personal history of benign neoplasm of the brain: Secondary | ICD-10-CM | POA: Diagnosis not present

## 2021-02-25 DIAGNOSIS — E8809 Other disorders of plasma-protein metabolism, not elsewhere classified: Secondary | ICD-10-CM

## 2021-02-25 DIAGNOSIS — Z87891 Personal history of nicotine dependence: Secondary | ICD-10-CM | POA: Diagnosis not present

## 2021-02-25 DIAGNOSIS — K219 Gastro-esophageal reflux disease without esophagitis: Secondary | ICD-10-CM | POA: Diagnosis present

## 2021-02-25 DIAGNOSIS — H209 Unspecified iridocyclitis: Secondary | ICD-10-CM | POA: Diagnosis present

## 2021-02-25 DIAGNOSIS — R4182 Altered mental status, unspecified: Secondary | ICD-10-CM | POA: Diagnosis not present

## 2021-02-25 DIAGNOSIS — E785 Hyperlipidemia, unspecified: Secondary | ICD-10-CM | POA: Diagnosis present

## 2021-02-25 DIAGNOSIS — Z9079 Acquired absence of other genital organ(s): Secondary | ICD-10-CM | POA: Diagnosis not present

## 2021-02-25 DIAGNOSIS — R7401 Elevation of levels of liver transaminase levels: Secondary | ICD-10-CM

## 2021-02-25 DIAGNOSIS — R509 Fever, unspecified: Secondary | ICD-10-CM | POA: Diagnosis not present

## 2021-02-25 DIAGNOSIS — Z79899 Other long term (current) drug therapy: Secondary | ICD-10-CM | POA: Diagnosis not present

## 2021-02-25 DIAGNOSIS — Z8249 Family history of ischemic heart disease and other diseases of the circulatory system: Secondary | ICD-10-CM | POA: Diagnosis not present

## 2021-02-25 DIAGNOSIS — I10 Essential (primary) hypertension: Secondary | ICD-10-CM | POA: Diagnosis not present

## 2021-02-25 DIAGNOSIS — D649 Anemia, unspecified: Secondary | ICD-10-CM | POA: Diagnosis present

## 2021-02-25 DIAGNOSIS — D75839 Thrombocytosis, unspecified: Secondary | ICD-10-CM

## 2021-02-25 DIAGNOSIS — Z20822 Contact with and (suspected) exposure to covid-19: Secondary | ICD-10-CM | POA: Diagnosis present

## 2021-02-25 DIAGNOSIS — N179 Acute kidney failure, unspecified: Secondary | ICD-10-CM

## 2021-02-25 DIAGNOSIS — K529 Noninfective gastroenteritis and colitis, unspecified: Secondary | ICD-10-CM | POA: Diagnosis not present

## 2021-02-25 DIAGNOSIS — D72828 Other elevated white blood cell count: Secondary | ICD-10-CM | POA: Diagnosis present

## 2021-02-25 DIAGNOSIS — H409 Unspecified glaucoma: Secondary | ICD-10-CM

## 2021-02-25 DIAGNOSIS — D72829 Elevated white blood cell count, unspecified: Secondary | ICD-10-CM

## 2021-02-25 DIAGNOSIS — Z8546 Personal history of malignant neoplasm of prostate: Secondary | ICD-10-CM | POA: Diagnosis not present

## 2021-02-25 LAB — SARS CORONAVIRUS 2 (TAT 6-24 HRS): SARS Coronavirus 2: NEGATIVE

## 2021-02-25 LAB — URINALYSIS, ROUTINE W REFLEX MICROSCOPIC
Bilirubin Urine: NEGATIVE
Glucose, UA: NEGATIVE mg/dL
Hgb urine dipstick: NEGATIVE
Ketones, ur: 5 mg/dL — AB
Leukocytes,Ua: NEGATIVE
Nitrite: NEGATIVE
Protein, ur: NEGATIVE mg/dL
Specific Gravity, Urine: >1.046 — ABNORMAL HIGH (ref 1.005–1.030)
pH: 5 (ref 5.0–8.0)

## 2021-02-25 LAB — COMPREHENSIVE METABOLIC PANEL
ALT: 127 U/L — ABNORMAL HIGH (ref 0–44)
AST: 98 U/L — ABNORMAL HIGH (ref 15–41)
Albumin: 2.1 g/dL — ABNORMAL LOW (ref 3.5–5.0)
Alkaline Phosphatase: 250 U/L — ABNORMAL HIGH (ref 38–126)
Anion gap: 9 (ref 5–15)
BUN: 30 mg/dL — ABNORMAL HIGH (ref 8–23)
CO2: 22 mmol/L (ref 22–32)
Calcium: 7.9 mg/dL — ABNORMAL LOW (ref 8.9–10.3)
Chloride: 107 mmol/L (ref 98–111)
Creatinine, Ser: 1.34 mg/dL — ABNORMAL HIGH (ref 0.61–1.24)
GFR, Estimated: 56 mL/min — ABNORMAL LOW (ref >60)
Glucose, Bld: 125 mg/dL — ABNORMAL HIGH (ref 70–99)
Potassium: 3.2 mmol/L — ABNORMAL LOW (ref 3.5–5.1)
Sodium: 138 mmol/L (ref 135–145)
Total Bilirubin: 1.3 mg/dL — ABNORMAL HIGH (ref 0.3–1.2)
Total Protein: 6.2 g/dL — ABNORMAL LOW (ref 6.5–8.1)

## 2021-02-25 LAB — URINE CULTURE: Culture: 10000 — AB

## 2021-02-25 LAB — PROTIME-INR
INR: 1.3 — ABNORMAL HIGH (ref 0.8–1.2)
Prothrombin Time: 16.5 seconds — ABNORMAL HIGH (ref 11.4–15.2)

## 2021-02-25 LAB — CBC
HCT: 32.6 % — ABNORMAL LOW (ref 39.0–52.0)
HCT: 32 % — ABNORMAL LOW (ref 39.0–52.0)
Hemoglobin: 10.4 g/dL — ABNORMAL LOW (ref 13.0–17.0)
Hemoglobin: 10.2 g/dL — ABNORMAL LOW (ref 13.0–17.0)
MCH: 28.9 pg (ref 26.0–34.0)
MCH: 28.8 pg (ref 26.0–34.0)
MCHC: 31.9 g/dL (ref 30.0–36.0)
MCHC: 31.9 g/dL (ref 30.0–36.0)
MCV: 90.6 fL (ref 80.0–100.0)
MCV: 90.4 fL (ref 80.0–100.0)
Platelets: 509 10*3/uL — ABNORMAL HIGH (ref 150–400)
Platelets: 494 10*3/uL — ABNORMAL HIGH (ref 150–400)
RBC: 3.6 MIL/uL — ABNORMAL LOW (ref 4.22–5.81)
RBC: 3.54 MIL/uL — ABNORMAL LOW (ref 4.22–5.81)
RDW: 13.1 % (ref 11.5–15.5)
RDW: 13.1 % (ref 11.5–15.5)
WBC: 17.2 10*3/uL — ABNORMAL HIGH (ref 4.0–10.5)
WBC: 17.3 10*3/uL — ABNORMAL HIGH (ref 4.0–10.5)
nRBC: 0 % (ref 0.0–0.2)
nRBC: 0 % (ref 0.0–0.2)

## 2021-02-25 LAB — CREATININE, SERUM
Creatinine, Ser: 1.4 mg/dL — ABNORMAL HIGH (ref 0.61–1.24)
GFR, Estimated: 53 mL/min — ABNORMAL LOW (ref >60)

## 2021-02-25 LAB — C-REACTIVE PROTEIN: CRP: 21.8 mg/dL — ABNORMAL HIGH (ref <1.0)

## 2021-02-25 LAB — HEPATITIS PANEL, ACUTE
HCV Ab: NONREACTIVE
Hep A IgM: NONREACTIVE
Hep B C IgM: NONREACTIVE
Hepatitis B Surface Ag: NONREACTIVE

## 2021-02-25 LAB — PHOSPHORUS: Phosphorus: 3.9 mg/dL (ref 2.5–4.6)

## 2021-02-25 LAB — APTT: aPTT: 31 seconds (ref 24–36)

## 2021-02-25 LAB — PROCALCITONIN: Procalcitonin: 0.81 ng/mL

## 2021-02-25 LAB — SEDIMENTATION RATE: Sed Rate: 120 mm/hr — ABNORMAL HIGH (ref 0–16)

## 2021-02-25 LAB — MAGNESIUM: Magnesium: 2.4 mg/dL (ref 1.7–2.4)

## 2021-02-25 MED ORDER — ACETAMINOPHEN 325 MG PO TABS
650.0000 mg | ORAL_TABLET | Freq: Four times a day (QID) | ORAL | Status: DC | PRN
  Administered 2021-02-25 – 2021-03-01 (×3): 650 mg via ORAL
  Filled 2021-02-25 (×3): qty 2

## 2021-02-25 MED ORDER — POTASSIUM CHLORIDE CRYS ER 20 MEQ PO TBCR
40.0000 meq | EXTENDED_RELEASE_TABLET | Freq: Once | ORAL | Status: AC
  Administered 2021-02-25: 40 meq via ORAL
  Filled 2021-02-25: qty 2

## 2021-02-25 MED ORDER — HEPARIN SODIUM (PORCINE) 5000 UNIT/ML IJ SOLN
5000.0000 [IU] | Freq: Three times a day (TID) | INTRAMUSCULAR | Status: DC
  Administered 2021-02-25 – 2021-03-02 (×15): 5000 [IU] via SUBCUTANEOUS
  Filled 2021-02-25 (×15): qty 1

## 2021-02-25 MED ORDER — ENSURE ENLIVE PO LIQD
237.0000 mL | Freq: Two times a day (BID) | ORAL | Status: DC
  Administered 2021-02-25 – 2021-03-02 (×7): 237 mL via ORAL
  Filled 2021-02-25 (×2): qty 237

## 2021-02-25 MED ORDER — PIPERACILLIN-TAZOBACTAM 3.375 G IVPB
3.3750 g | Freq: Three times a day (TID) | INTRAVENOUS | Status: DC
  Administered 2021-02-25 – 2021-02-28 (×10): 3.375 g via INTRAVENOUS
  Filled 2021-02-25 (×10): qty 50

## 2021-02-25 MED ORDER — BRIMONIDINE TARTRATE 0.2 % OP SOLN
1.0000 [drp] | Freq: Three times a day (TID) | OPHTHALMIC | Status: DC
  Administered 2021-02-25 – 2021-03-02 (×15): 1 [drp] via OPHTHALMIC
  Filled 2021-02-25 (×6): qty 5

## 2021-02-25 MED ORDER — PREDNISONE 20 MG PO TABS: 60.0000 mg | ORAL_TABLET | Freq: Every day | ORAL | Status: DC

## 2021-02-25 MED ORDER — TIMOLOL MALEATE 0.5 % OP SOLN
1.0000 [drp] | Freq: Two times a day (BID) | OPHTHALMIC | Status: DC
  Administered 2021-02-25 – 2021-03-02 (×11): 1 [drp] via OPHTHALMIC
  Filled 2021-02-25 (×5): qty 5

## 2021-02-25 MED ORDER — PANTOPRAZOLE SODIUM 40 MG IV SOLR
40.0000 mg | INTRAVENOUS | Status: DC
  Administered 2021-02-25 – 2021-02-28 (×4): 40 mg via INTRAVENOUS
  Filled 2021-02-25 (×4): qty 40

## 2021-02-25 MED ORDER — PREDNISOLONE ACETATE 1 % OP SUSP
1.0000 [drp] | Freq: Four times a day (QID) | OPHTHALMIC | Status: DC
  Administered 2021-02-25 – 2021-03-02 (×17): 1 [drp] via OPHTHALMIC
  Filled 2021-02-25 (×2): qty 1

## 2021-02-25 MED ORDER — LACTATED RINGERS IV SOLN: INTRAVENOUS | Status: DC

## 2021-02-25 MED ORDER — PREDNISONE 20 MG PO TABS
60.0000 mg | ORAL_TABLET | Freq: Every day | ORAL | Status: DC
  Administered 2021-02-25: 60 mg via ORAL
  Filled 2021-02-25 (×2): qty 3

## 2021-02-25 MED ORDER — PIPERACILLIN-TAZOBACTAM 3.375 G IVPB 30 MIN: 3.3750 g | Freq: Four times a day (QID) | INTRAVENOUS | Status: DC

## 2021-02-25 MED ORDER — SODIUM CHLORIDE 0.9 % IV SOLN: Freq: Once | INTRAVENOUS | Status: AC

## 2021-02-25 MED ORDER — AMLODIPINE BESYLATE 5 MG PO TABS
5.0000 mg | ORAL_TABLET | Freq: Every day | ORAL | Status: DC
  Administered 2021-02-25 – 2021-02-28 (×4): 5 mg via ORAL
  Filled 2021-02-25 (×5): qty 1

## 2021-02-25 MED ORDER — PIPERACILLIN-TAZOBACTAM 3.375 G IVPB 30 MIN
3.3750 g | Freq: Once | INTRAVENOUS | Status: AC
  Administered 2021-02-25: 3.375 g via INTRAVENOUS
  Filled 2021-02-25: qty 50

## 2021-02-25 NOTE — H&P (Signed)
History and Physical  Saahir Prude IRW:[id number] DOB: Feb 15, 1946 DOA: 02/24/2021  Referring physician: Truddie Hidden, MD PCP: Monico Blitz, MD  Patient coming from: Home  Chief Complaint: Altered mental status  HPI: Curtis Marquez is a 75 y.o. male with medical history significant for hypertension, glaucoma, s/p brain tumor excision (9094), prostate cancer s/p robotic assisted laparoscopic radical prostatectomy (January 2015) who presents to the emergency department via EMS due to generalized weakness.  Patient was unable to provide history, history was obtained from ED physician and ED medical record.  Per report, patient has had progressive weakness and confusion with poor appetite since last visit to the ED (5/2).  Patient was seen in the ED due to left eye pain caused by increased intraocular pressure and was treated for acute glaucoma and discharged home to follow-up with ophthalmologist.  During the ED visit, WBC was noted to be elevated at 16.7 and was noted to be febrile (per medical record).  He was brought to the ED due to worsening of symptoms for further evaluation and management.  ED Course:  In the emergency department, he was febrile with a temperature of 100.87F and was intermittently tachypneic.  Work-up in the ED showed leukocytosis, thrombocytosis, normocytic anemia.  Albumin 2.4, BUN to creatinine 35/1.61 (baseline creatinine at 0.9-1.2), lactic acid was flat at 1.2, FOBT was negative.  Influenza A, B and SARS coronavirus 2 was negative.  Urinalysis was unimpressive for UTI CT head without contrast showed no acute intracranial abnormalities Chest x-ray showed no active disease. CT chest abdomen and pelvis with contrast showed   Prominent wall thickening involving distal small bowel in the pelvis with associated inflammatory changes, consistent with enteritis of infectious, inflammatory, or ischemic etiology.  Abnormal appearance of the aortic arch and proximal descending thoracic  aorta which demonstrates indistinct mural thickening and surrounding inflammatory change, suspicious for aortitis/vasculitis. This could be secondary to infectious etiologies as well as autoimmune disease. Patient was empirically started on IV Zosyn, IV hydration was provided.  Hospitalist was asked to admit patient for further evaluation and management.   Review of Systems: This cannot be assessed at this time due to patient's altered mental status  Past Medical History:  Diagnosis Date  . Brain tumor ([place])   . GERD (gastroesophageal reflux disease)   . Hypercholesteremia   . Hypertension   . Prostate cancer ([place]) 11/13/13   Past Surgical History:  Procedure Laterality Date  . BRAIN TUMOR EXCISION  1994  . ESOPHAGOGASTRODUODENOSCOPY N/A 03/18/2019   Procedure: ESOPHAGOGASTRODUODENOSCOPY (EGD);  Surgeon: Danie Binder, MD;  Location: [facility];  Service: Endoscopy;  Laterality: N/A;  . ESOPHAGOGASTRODUODENOSCOPY N/A 03/24/2019   Procedure: ESOPHAGOGASTRODUODENOSCOPY (EGD);  Surgeon: Danie Binder, MD;  Location: [facility];  Service: Endoscopy;  Laterality: N/A;  8:30AM - pt had Covid test done in ED & has not been anywhere since, office spoke with him and he understands to remain quarantined  . LYMPHADENECTOMY Bilateral 11/13/2013   Procedure: LYMPHADENECTOMY "PELVIC LYMPH NODE DISSECTION";  Surgeon: Dutch Gray, MD;  Location: [facility];  Service: Urology;  Laterality: Bilateral;  . ROBOT ASSISTED LAPAROSCOPIC RADICAL PROSTATECTOMY N/A 11/13/2013   Procedure: ROBOTIC ASSISTED LAPAROSCOPIC RADICAL PROSTATECTOMY LEVEL 2;  Surgeon: Dutch Gray, MD;  Location: [facility];  Service: Urology;  Laterality: N/A;  . SAVORY DILATION N/A 03/24/2019   Procedure: SAVORY DILATION;  Surgeon: Danie Binder, MD;  Location: [facility];  Service: Endoscopy;  Laterality: N/A;    Social History:  reports that he has quit smoking. He has never used smokeless tobacco. He reports that he does not drink  alcohol and does not use drugs.   No Known Allergies  Family History  Problem Relation Age of Onset  . CVA Father   . Diabetes Mother   . Hypertension Mother   . Diabetes Sister   . Hypertension Sister   . Cancer Brother      Prior to Admission medications   Medication Sig Start Date End Date Taking? Authorizing Provider  amLODipine (NORVASC) 5 MG tablet Take 1 tablet by mouth daily. 02/15/21  Yes [provider]  brimonidine (ALPHAGAN) 0.2 % ophthalmic solution Place 1 drop into the left eye in the morning and at bedtime.   Yes [provider]  cloNIDine (CATAPRES) 0.3 MG tablet Take 0.3 mg by mouth 2 (two) times daily. 01/15/21  Yes [provider]  levocetirizine (XYZAL) 5 MG tablet Take 5 mg by mouth every evening.   Yes [provider]  olmesartan (BENICAR) 40 MG tablet Take 40 mg by mouth daily. 02/10/19  Yes [provider]  prednisoLONE acetate (PRED FORTE) 1 % ophthalmic suspension Place 1 drop into the left eye 4 (four) times daily. 02/22/21  Yes [provider]  timolol (BETIMOL) 0.5 % ophthalmic solution Place 1 drop into the left eye 2 (two) times daily.   Yes [provider]  acetaZOLAMIDE (DIAMOX) 250 MG tablet Take 2 tablets (500 mg total) by mouth 2 (two) times daily for 2 days. Patient not taking: Reported on 02/24/2021 02/21/21 02/23/21  Margette Fast, MD  amoxicillin (AMOXIL) 500 MG tablet 2 PO BID FOR 10 DAYS Patient not taking: Reported on 02/24/2021 03/24/19   Danie Binder, MD  buPROPion [facility] SR) 100 MG 12 hr tablet Take 100 mg by mouth daily. Patient not taking: Reported on 02/24/2021 02/04/19   [provider]  clarithromycin (BIAXIN) 500 MG tablet 1 PO BID FOR 10 DAYS. Patient not taking: Reported on 02/24/2021 03/24/19   Danie Binder, MD  clonazePAM (KLONOPIN) 0.5 MG tablet Take 0.5 mg by mouth daily as needed for anxiety.  Patient not taking: Reported on 02/24/2021 02/04/19   [provider]  cloNIDine (CATAPRES) 0.1 MG tablet Take 0.3 mg by mouth 2 (two) times daily. Patient not taking: Reported on 02/24/2021 02/13/19   [provider]  hydrALAZINE (APRESOLINE) 50 MG tablet Take 50 mg by mouth 2 (two) times a day.  Patient not taking: Reported on 02/24/2021 01/28/19   [provider]  losartan (COZAAR) 100 MG tablet Take 100 mg by mouth daily.  Patient not taking: Reported on 02/24/2021    [provider]  meloxicam (MOBIC) 15 MG tablet Take 1 tablet by mouth daily. Patient not taking: No sig reported 02/15/21   [provider]  omeprazole (PRILOSEC) 20 MG capsule 1 PO 30 mins prior to breakfast and supper Patient not taking: Reported on 02/24/2021 03/18/19   Danie Binder, MD    Physical Exam: BP (!) 134/56   Pulse 84   Temp 99.5 F (37.5 C) (Oral)   Resp (!) 22   SpO2 96%   . General: 75 y.o. year-old male well developed in no acute distress. Marland Kitchen HEENT: NCAT, EOMI . Neck: Supple, trachea medial . Cardiovascular: Regular rate and rhythm with no rubs or gallops.  No thyromegaly or JVD noted.  No lower extremity edema. 2/4 pulses in all 4 extremities. Marland Kitchen Respiratory: Clear to auscultation  with no wheezes or rales. Good inspiratory effort. . Abdomen: Soft nontender nondistended with normal bowel sounds x4 quadrants. . Muskuloskeletal: No cyanosis, clubbing or edema noted bilaterally . Neuro: No focal neurologic deficit.  Sensation intact.  Alert but disoriented. . Skin: No ulcerative lesions noted or rashes . Psychiatry: Mood is appropriate for condition and setting          Labs on Admission:  Basic Metabolic Panel: Recent Labs  Lab 02/21/21 1920 02/24/21 1745  NA 139 139  K 3.3* 3.6  CL 101 106  CO2 26 23  GLUCOSE 125* 110*  BUN 12 35*  CREATININE 0.88 1.61*  CALCIUM 8.5* 8.6*   Liver Function Tests: Recent Labs  Lab 02/21/21 1920 02/24/21 1745  AST 17 95*  ALT 49* 126*  ALKPHOS 191* 253*  BILITOT 1.0 1.0   PROT 8.0 7.3  ALBUMIN 3.0* 2.4*   Recent Labs  Lab 02/21/21 1920  LIPASE 23   No results for input(s): AMMONIA in the last 168 hours. CBC: Recent Labs  Lab 02/21/21 1920 02/24/21 1745  WBC 16.7* 15.7*  NEUTROABS 14.1* 13.2*  HGB 12.9* 12.1*  HCT 39.9 37.7*  MCV 90.1 90.6  PLT 604* 641*   Cardiac Enzymes: No results for input(s): CKTOTAL, CKMB, CKMBINDEX, TROPONINI in the last 168 hours.  BNP (last 3 results) No results for input(s): BNP in the last 8760 hours.  ProBNP (last 3 results) No results for input(s): PROBNP in the last 8760 hours.  CBG: No results for input(s): GLUCAP in the last 168 hours.  Radiological Exams on Admission: CT Head Wo Contrast  Result Date: 02/24/2021 CLINICAL DATA:  Mental status change. Weakness, loss of appetite and fever. EXAM: CT HEAD WITHOUT CONTRAST TECHNIQUE: Contiguous axial images were obtained from the base of the skull through the vertex without intravenous contrast. COMPARISON:  02/09/2016 FINDINGS: Brain: No evidence of acute infarction, hemorrhage, hydrocephalus, extra-axial collection or mass lesion/mass effect. There is mild diffuse low-attenuation within the subcortical and periventricular white matter compatible with chronic microvascular disease. Vascular: No hyperdense vessel or unexpected calcification. Skull: Previous right frontal craniotomy. Negative for fracture or focal lesion. Sinuses/Orbits: No acute finding. Other: None. IMPRESSION: 1. No acute intracranial abnormalities. 2. Chronic small vessel ischemic change and brain atrophy. Electronically Signed   By: Kerby Moors M.D.   On: 02/24/2021 18:54   CT CHEST ABDOMEN PELVIS W CONTRAST  Result Date: 02/24/2021 CLINICAL DATA:  Abdomen pain fever and leukocytosis EXAM: CT CHEST, ABDOMEN, AND PELVIS WITH CONTRAST TECHNIQUE: Multidetector CT imaging of the chest, abdomen and pelvis was performed following the standard protocol during bolus administration of intravenous  contrast. CONTRAST:  3m OMNIPAQUE IOHEXOL 300 MG/ML  SOLN COMPARISON:  CT 02/09/2016 FINDINGS: CT CHEST FINDINGS Cardiovascular: Mild aortic atherosclerosis. Indistinct wall thickening at the great vessel origins aortic arch and proximal descending aorta. No dissection is seen. There is no aneurysmal formation. Mild aortic atherosclerosis. Cardiomegaly. Common origin of the left common carotid artery and brachiocephalic artery. Cardiomegaly. Trace pericardial effusion. Mediastinum/Nodes: Midline trachea. No thyroid mass. No suspicious adenopathy. Esophagus within normal limits Lungs/Pleura: No consolidation or pneumothorax. No significant pleural effusion Musculoskeletal: No acute or suspicious osseous abnormality. CT ABDOMEN PELVIS FINDINGS Hepatobiliary: Subcentimeter hypodensity within the anterior liver too small to further characterize. No calcified gallstone or biliary dilatation. Pancreas: Unremarkable. No pancreatic ductal dilatation or surrounding inflammatory changes. Spleen: Normal in size without focal abnormality. Adrenals/Urinary Tract: Adrenal glands within normal limits. Kidneys show no hydronephrosis. Cyst in the right  kidney. The bladder is unremarkable Stomach/Bowel: The stomach is nonenlarged. Negative appendix. Wall thickening and inflammatory change involving distal small bowel in the pelvis concerning for enteritis. Vascular/Lymphatic: Nonaneurysmal aorta. Mild aortic atherosclerosis. Possible mild mural thickening of the abdominal aorta, external iliac vessels and common femoral vessels. No suspicious nodes. Reproductive: Status post prostatectomy. Other: Small free fluid in the pelvis.  No free air. Musculoskeletal: No acute or suspicious osseous abnormality IMPRESSION: 1. Prominent wall thickening involving distal small bowel in the pelvis with associated inflammatory changes, consistent with enteritis of infectious, inflammatory, or ischemic etiology. 2. Abnormal appearance of the aortic  arch and proximal descending thoracic aorta which demonstrates indistinct mural thickening and surrounding inflammatory change, suspicious for aortitis/vasculitis. This could be secondary to infectious etiologies as well as autoimmune disease. There is no aneurysmal dilatation at this time. 3. Small free fluid in the pelvis Electronically Signed   By: Donavan Foil M.D.   On: 02/24/2021 23:31   DG Chest Port 1 View  Result Date: 02/24/2021 CLINICAL DATA:  Sepsis EXAM: PORTABLE CHEST 1 VIEW COMPARISON:  11/28/2018 FINDINGS: The heart size and mediastinal contours are within normal limits. Both lungs are clear. The visualized skeletal structures are unremarkable. IMPRESSION: No active disease. Electronically Signed   By: Fidela Salisbury MD   On: 02/24/2021 18:04    EKG: I independently viewed the EKG done and my findings are as followed: Normal sinus rhythm at a rate of 61 bpm  Assessment/Plan Present on Admission: . Regional enteritis of small bowel ([place]) . GERD (gastroesophageal reflux disease)  Principal Problem:   Regional enteritis of small bowel ([place]) Active Problems:   GERD (gastroesophageal reflux disease)   Transaminitis   AKI (acute kidney injury) ([facility])   Hypoalbuminemia   Leukocytosis   Thrombocytosis   Essential hypertension   Altered mental status   Generalized weakness  Altered mental status and generalized weakness possibly secondary to multifactorial Patient was alert, though still confused.  Continue fall precaution and neurochecks  Presumed acute enteritis CT chest, abdomen and pelvis was suggestive of infectious/inflammatory/ischemic enteritis He was febrile, WBC was 15.7 He was started on IV Zosyn, we shall continue with same at this time with plan to de-escalate/discontinue based on blood culture/procalcitonin Continue Tylenol 650 mg p.o. every 6 hours as needed  Possible acute aortitis/vasculitis CT chest abdomen pelvis suggestive of  aortitis/vasculitis Continue IV Zosyn   Transaminitis  AST 95, ALT 126, ALP 253 Hepatitis panel will be checked Patient denies abdominal pain Consider RUQ ultrasound for worsening of symptoms  Leukocytosis (chronic) WBC 15.7, continue to monitor WBC  Thrombocytosis possibly reactive Platelets 641, continue to monitor platelet numbers with morning labs  Acute kidney injury BUN to creatinine 35/1.61 (baseline creatinine at 0.9-1.2) Continue gentle hydration Renally adjust medications, avoid nephrotoxic agents/dehydration/hypotension  Hypoalbuminemia possibly secondary to moderate protein calorie malnutrition  Protein supplement will be provided  Essential hypertension  Continue Norvasc  Glaucoma Continue Alphagan, Pred Forte, timolol   DVT prophylaxis: Heparin  Code Status: Full code  Family Communication: None at bedside  Disposition Plan:  Patient is from:                        home Anticipated DC to:                   SNF or family members home Anticipated DC date:               2-3 days Anticipated  DC barriers:           Patient requires inpatient management due to enteritis requiring inpatient management  Consults called: None  Admission status: Inpatient    Bernadette Hoit MD [facility]  02/25/2021, 1:55 AM

## 2021-02-25 NOTE — Consult Note (Signed)
Called regarding pt with inflammatory changes or aorta.  I reviewed pt CT scan.  There is some haziness around the aorta but no dissection aneurysm or significant obstruction.  Would recommend Rheumatology or ID consult.  No vascular surgical issue to address currently.  Ruta Hinds, MD [facility] [place] Office: [phone]

## 2021-02-25 NOTE — Progress Notes (Signed)
PROGRESS NOTE    Curtis Marquez  TIW:[id number] DOB: 25-Feb-1946 DOA: 02/24/2021 PCP: Monico Blitz, MD   Chief Complaint  Patient presents with  . Altered Mental Status   Brief Narrative:  Curtis Marquez is Curtis Marquez 75 y.o. male with medical history significant for hypertension, glaucoma, s/p brain tumor excision (1994), prostate cancer s/p robotic assisted laparoscopic radical prostatectomy (January 2015) who presents to the emergency department via EMS due to generalized weakness.  Per report, patient has had progressive weakness and confusion with poor appetite since last visit to the ED (5/2).  Patient was seen in the ED due to left eye pain caused by increased intraocular pressure and was treated for acute glaucoma and discharged home to follow-up with ophthalmologist.  He represented with fatigue and general malaise as well as fevers.  Imaging is notable for enteritis and possible vasculitis.  Admitted for further workup.  Assessment & Plan:   Principal Problem:   Regional enteritis of small bowel ([place]) Active Problems:   Transaminitis   AKI (acute kidney injury) ([place])   Hypoalbuminemia   Leukocytosis   Thrombocytosis   Essential hypertension   Altered mental status   Generalized weakness   Glaucoma  Systemic Inflammatory Response Syndrome Concern for Vasculitis Concern for Enteritis  Currently being treated with zosyn for Sederick Jacobsen possible infection Recurrent fevers, leukocytosis -> meets criteria for SIRS CT with prominent wall thickening involving distal small bowel in the pelvis with associated inflammatory changes - c/w enteritis of infectious, inflammatory or ischemic etiology - also with abnormal appearance of aortic arch and proximal descending thoracic aorta which demonstrates indistinct mural thickening and surrounding inflammatory change, suspicious for aortitis/vasculitis  ESR 120, CRP 21.8 Procal 0.81 Follow autoimmune labs - ANCA titers, MPO/PR3, ANA, C3/C4, RF, anti CCP Quant gold,  RPR, acute hepatitis panel  Discussed with vascular, recommended discussion with ID or rheum.  No rheum in house, discussed with [facility] rheum.  Discussed overall plan.  Noted if concern for GCA, consider steroids, 1 mg/kg.  Can follow outpatient or if worsening can transfer.   Blood cultures pending.  Follow urine cultures.  Discussed case with ID recommended repeating blood cultures.  Consider d/c abx if cx negative at 48 hrs. Carotid dopplers without evidence of vasculitis (moderate bilateral atherosclerotic plaque, no hemodynamically significant stenosis) Will plan on CT chest dissection study tomorrow to evaluate thoracic aorta (give kidneys Breta Demedeiros break with AKI) Constellation of sx concerning for vasculitis/inflammatory condition.  Of note, discussed with his ophthalmology office who note his exam was c/w uveitis (increased IOP thought 2/2 to this).  His presentation with eye pain, vision loss, concerning for GCA in this presentation, though uveitis doesn't appear to be common finding with this.  No jaw claudication, discomfort with palpation.  Could consider temporal artery biopsy, though will follow symptoms, repeat imaging first.  Generalized Weakness  Altered Mental Status No focal neuro deficits.  Justice Milliron&Ox~2 today when I spoke to him. Suspect this is 2/2 above.  Head CT with chronic small vessel ischemic changes. Consider additional imaging in setting of above  Elevated LFT's Follow acute hepatitis panel     Acute Kidney Injury Baseline <1 1.61 at presentation, improving, continue IVF  Glaucoma  Uveitis Continue eye drops  DVT prophylaxis: heparin  Code Status: full  Family Communication: none at bedside Disposition:   Status is: Inpatient  Remains inpatient appropriate because:Inpatient level of care appropriate due to severity of illness   Dispo: The patient is from: Home  Anticipated d/c is to: Home              Patient currently is not medically stable to d/c.    Difficult to place patient No  Consultants:   Phone consults with ID, vascular, rheum   Procedures:  none  Antimicrobials:  Anti-infectives (From admission, onward)   Start     Dose/Rate Route Frequency Ordered Stop   02/25/21 0800  piperacillin-tazobactam (ZOSYN) IVPB 3.375 g        3.375 g 12.5 mL/hr over 240 Minutes Intravenous Every 8 hours 02/25/21 0210     02/25/21 0615  piperacillin-tazobactam (ZOSYN) IVPB 3.375 g  Status:  Discontinued        3.375 g 100 mL/hr over 30 Minutes Intravenous Every 6 hours 02/25/21 0206 02/25/21 0210   02/25/21 0015  piperacillin-tazobactam (ZOSYN) IVPB 3.375 g        3.375 g 100 mL/hr over 30 Minutes Intravenous  Once 02/25/21 0001 02/25/21 0119   02/25/21 0000  piperacillin-tazobactam (ZOSYN) IVPB 4.5 g  Status:  Discontinued        4.5 g 200 mL/hr over 30 Minutes Intravenous  Once 02/24/21 2359 02/25/21 0001     Subjective: Curtis Marquez&Ox2  Objective: Vitals:   02/25/21 0755 02/25/21 1349 02/25/21 1425 02/25/21 1532  BP: (!) 113/58 120/66    Pulse: 65 (!) 50 (!) 58 62  Resp: 18   18  Temp: (!) 100.7 F (38.2 C) 97.9 F (36.6 C)    TempSrc: Oral Oral    SpO2: 93% (!) 84% 100% 98%  Weight:      Height:        Intake/Output Summary (Last 24 hours) at 02/25/2021 1846 Last data filed at 02/25/2021 1300 Gross per 24 hour  Intake 2184.36 ml  Output 95 ml  Net 2089.36 ml   Filed Weights   02/25/21 0527  Weight: 73.4 kg    Examination:  General exam: Appears calm and comfortable  HEENT: no L jaw TTP or TTP on L temple Respiratory system: Clear to auscultation. Respiratory effort normal. Cardiovascular system: S1 & S2 heard, RRR.  Gastrointestinal system: Abdomen is nondistended, soft and nontender. Central nervous system: Alert and oriented x2. No focal neurological deficits. Extremities: no LEE Skin: No rashes, lesions or ulcers appreciated Psychiatry: Judgement and insight appear normal. Mood & affect appropriate.     Data  Reviewed: I have personally reviewed following labs and imaging studies  CBC: Recent Labs  Lab 02/21/21 1920 02/24/21 1745 02/25/21 0156 02/25/21 0445  WBC 16.7* 15.7* 17.2* 17.3*  NEUTROABS 14.1* 13.2*  --   --   HGB 12.9* 12.1* 10.4* 10.2*  HCT 39.9 37.7* 32.6* 32.0*  MCV 90.1 90.6 90.6 90.4  PLT 604* 641* 509* 494*    Basic Metabolic Panel: Recent Labs  Lab 02/21/21 1920 02/24/21 1745 02/25/21 0156 02/25/21 0445  NA 139 139  --  138  K 3.3* 3.6  --  3.2*  CL 101 106  --  107  CO2 26 23  --  22  GLUCOSE 125* 110*  --  125*  BUN 12 35*  --  30*  CREATININE 0.88 1.61* 1.40* 1.34*  CALCIUM 8.5* 8.6*  --  7.9*  MG  --   --   --  2.4  PHOS  --   --   --  3.9    GFR: Estimated Creatinine Clearance: 48.2 mL/min (Valia Wingard) (by C-G formula based on SCr of 1.34 mg/dL (H)).  Liver Function Tests:  Recent Labs  Lab 02/21/21 1920 02/24/21 1745 02/25/21 0445  AST 17 95* 98*  ALT 49* 126* 127*  ALKPHOS 191* 253* 250*  BILITOT 1.0 1.0 1.3*  PROT 8.0 7.3 6.2*  ALBUMIN 3.0* 2.4* 2.1*    CBG: No results for input(s): GLUCAP in the last 168 hours.   Recent Results (from the past 240 hour(s))  Culture, blood (routine x 2)     Status: None (Preliminary result)   Collection Time: 02/21/21  7:30 PM   Specimen: BLOOD  Result Value Ref Range Status   Specimen Description BLOOD RIGHT ANTECUBITAL  Final   Special Requests   Final    BOTTLES DRAWN AEROBIC AND ANAEROBIC Blood Culture adequate volume   Culture   Final    NO GROWTH 4 DAYS Performed at [facility], [address], [place], [place] [address]    Report Status PENDING  Incomplete  Culture, blood (routine x 2)     Status: None (Preliminary result)   Collection Time: 02/21/21  7:30 PM   Specimen: BLOOD RIGHT FOREARM  Result Value Ref Range Status   Specimen Description BLOOD RIGHT FOREARM  Final   Special Requests   Final    BOTTLES DRAWN AEROBIC AND ANAEROBIC Blood Culture adequate volume   Culture   Final    NO  GROWTH 4 DAYS Performed at [facility], [address], [place], [place] [address]    Report Status PENDING  Incomplete  Urine culture     Status: Abnormal   Collection Time: 02/21/21  9:49 PM   Specimen: Urine, Clean Catch  Result Value Ref Range Status   Specimen Description   Final    URINE, CLEAN CATCH Performed at [facility], [address], [place], [place] [address]    Special Requests   Final    NONE Performed at [facility], [address] [place], [place] [address]    Culture (Lydiah Pong)  Final    10,000 COLONIES/mL AEROCOCCUS URINAE Standardized susceptibility testing for this organism is not available. Performed at [facility], [place] [address], [place], [place] [address]    Report Status 02/25/2021 FINAL  Final  SARS CORONAVIRUS 2 (TAT 6-24 HRS) Nasopharyngeal Nasopharyngeal Swab     Status: None   Collection Time: 02/24/21  5:32 PM   Specimen: Nasopharyngeal Swab  Result Value Ref Range Status   SARS Coronavirus 2 NEGATIVE NEGATIVE Final    Comment: (NOTE) SARS-CoV-2 target nucleic acids are NOT DETECTED.  The SARS-CoV-2 RNA is generally detectable in upper and lower respiratory specimens during the acute phase of infection. Negative results do not preclude SARS-CoV-2 infection, do not rule out co-infections with other pathogens, and should not be used as the sole basis for treatment or other patient management decisions. Negative results must be combined with clinical observations, patient history, and epidemiological information. The expected result is Negative.  Fact Sheet for Patients: SugarRoll.be  Fact Sheet for Healthcare Providers: https://www.woods-mathews.com/  This test is not yet approved or cleared by the [place] FDA and  has been authorized for detection and/or diagnosis of SARS-CoV-2 by FDA under an Emergency Use Authorization (EUA). This EUA will remain  in effect (meaning this test can be used)  for the duration of the COVID-19 declaration under Se ction 564(b)(1) of the Act, 21 U.S.C. section 360bbb-3(b)(1), unless the authorization is terminated or revoked sooner.  Performed at [facility], [place] [address] [place],  [address]   Blood Culture (routine x  2)     Status: None (Preliminary result)   Collection Time: 02/24/21  5:45 PM   Specimen: BLOOD LEFT ARM  Result Value Ref Range Status   Specimen Description BLOOD LEFT ARM  Final   Special Requests   Final    BOTTLES DRAWN AEROBIC AND ANAEROBIC Blood Culture adequate volume   Culture   Final    NO GROWTH < 12 HOURS Performed at [facility], [address], [place], [place] [address]    Report Status PENDING  Incomplete  Blood Culture (routine x 2)     Status: None (Preliminary result)   Collection Time: 02/24/21  5:45 PM   Specimen: BLOOD RIGHT ARM  Result Value Ref Range Status   Specimen Description BLOOD RIGHT ARM  Final   Special Requests   Final    BOTTLES DRAWN AEROBIC AND ANAEROBIC Blood Culture adequate volume   Culture   Final    NO GROWTH < 12 HOURS Performed at [facility], [address] [place], [place] [address]    Report Status PENDING  Incomplete  Resp Panel by RT-PCR (Flu Harkirat Orozco&B, Covid) Nasopharyngeal Swab     Status: None   Collection Time: 02/24/21  9:06 PM   Specimen: Nasopharyngeal Swab; Nasopharyngeal(NP) swabs in vial transport medium  Result Value Ref Range Status   SARS Coronavirus 2 by RT PCR NEGATIVE NEGATIVE Final    Comment: (NOTE) SARS-CoV-2 target nucleic acids are NOT DETECTED.  The SARS-CoV-2 RNA is generally detectable in upper respiratory specimens during the acute phase of infection. The lowest concentration of SARS-CoV-2 viral copies this assay can detect is 138 copies/mL. Jayme Mednick negative result does not preclude SARS-Cov-2 infection and should not be used as the sole basis for treatment or other patient management decisions. Ranon Coven negative result may occur with   improper specimen collection/handling, submission of specimen other than nasopharyngeal swab, presence of viral mutation(s) within the areas targeted by this assay, and inadequate number of viral copies(<138 copies/mL). Jameer Storie negative result must be combined with clinical observations, patient history, and epidemiological information. The expected result is Negative.  Fact Sheet for Patients:  EntrepreneurPulse.com.au  Fact Sheet for Healthcare Providers:  IncredibleEmployment.be  This test is no t yet approved or cleared by the [place] FDA and  has been authorized for detection and/or diagnosis of SARS-CoV-2 by FDA under an Emergency Use Authorization (EUA). This EUA will remain  in effect (meaning this test can be used) for the duration of the COVID-19 declaration under Section 564(b)(1) of the Act, 21 U.S.C.section 360bbb-3(b)(1), unless the authorization is terminated  or revoked sooner.       Influenza Calypso Hagarty by PCR NEGATIVE NEGATIVE Final   Influenza B by PCR NEGATIVE NEGATIVE Final    Comment: (NOTE) The Xpert Xpress SARS-CoV-2/FLU/RSV plus assay is intended as an aid in the diagnosis of influenza from Nasopharyngeal swab specimens and should not be used as Kema Santaella sole basis for treatment. Nasal washings and aspirates are unacceptable for Xpert Xpress SARS-CoV-2/FLU/RSV testing.  Fact Sheet for Patients: EntrepreneurPulse.com.au  Fact Sheet for Healthcare Providers: IncredibleEmployment.be  This test is not yet approved or cleared by the [place] FDA and has been authorized for detection and/or diagnosis of SARS-CoV-2 by FDA under an Emergency Use Authorization (EUA). This EUA will remain in effect (meaning this test can be used) for the duration of the COVID-19 declaration under Section 564(b)(1) of the Act, 21 U.S.C. section 360bbb-3(b)(1), unless the authorization is terminated  or revoked.  Performed at  [facility], [address] [place], [place] [address]   Culture, blood (routine x 2)     Status: None (Preliminary result)   Collection Time: 02/25/21  5:03 PM   Specimen: Right Antecubital; Blood  Result Value Ref Range Status   Specimen Description RIGHT ANTECUBITAL  Final   Special Requests   Final    Blood Culture adequate volume BOTTLES DRAWN AEROBIC AND ANAEROBIC Performed at [facility], [address] [place], [place] [address]    Culture PENDING  Incomplete   Report Status PENDING  Incomplete  Culture, blood (routine x 2)     Status: None (Preliminary result)   Collection Time: 02/25/21  5:03 PM   Specimen: BLOOD RIGHT ARM  Result Value Ref Range Status   Specimen Description BLOOD RIGHT ARM  Final   Special Requests   Final    Blood Culture results may not be optimal due to an inadequate volume of blood received in culture bottles BOTTLES DRAWN AEROBIC ONLY Performed at [facility], [address] [place], [place] [address]    Culture PENDING  Incomplete   Report Status PENDING  Incomplete         Radiology Studies: CT Head Wo Contrast  Result Date: 02/24/2021 CLINICAL DATA:  Mental status change. Weakness, loss of appetite and fever. EXAM: CT HEAD WITHOUT CONTRAST TECHNIQUE: Contiguous axial images were obtained from the base of the skull through the vertex without intravenous contrast. COMPARISON:  02/09/2016 FINDINGS: Brain: No evidence of acute infarction, hemorrhage, hydrocephalus, extra-axial collection or mass lesion/mass effect. There is mild diffuse low-attenuation within the subcortical and periventricular white matter compatible with chronic microvascular disease. Vascular: No hyperdense vessel or unexpected calcification. Skull: Previous right frontal craniotomy. Negative for fracture or focal lesion. Sinuses/Orbits: No acute finding. Other: None. IMPRESSION: 1. No acute intracranial abnormalities. 2. Chronic small vessel ischemic  change and brain atrophy. Electronically Signed   By: Kerby Moors M.D.   On: 02/24/2021 18:54   CT CHEST ABDOMEN PELVIS W CONTRAST  Result Date: 02/24/2021 CLINICAL DATA:  Abdomen pain fever and leukocytosis EXAM: CT CHEST, ABDOMEN, AND PELVIS WITH CONTRAST TECHNIQUE: Multidetector CT imaging of the chest, abdomen and pelvis was performed following the standard protocol during bolus administration of intravenous contrast. CONTRAST:  40m OMNIPAQUE IOHEXOL 300 MG/ML  SOLN COMPARISON:  CT 02/09/2016 FINDINGS: CT CHEST FINDINGS Cardiovascular: Mild aortic atherosclerosis. Indistinct wall thickening at the great vessel origins aortic arch and proximal descending aorta. No dissection is seen. There is no aneurysmal formation. Mild aortic atherosclerosis. Cardiomegaly. Common origin of the left common carotid artery and brachiocephalic artery. Cardiomegaly. Trace pericardial effusion. Mediastinum/Nodes: Midline trachea. No thyroid mass. No suspicious adenopathy. Esophagus within normal limits Lungs/Pleura: No consolidation or pneumothorax. No significant pleural effusion Musculoskeletal: No acute or suspicious osseous abnormality. CT ABDOMEN PELVIS FINDINGS Hepatobiliary: Subcentimeter hypodensity within the anterior liver too small to further characterize. No calcified gallstone or biliary dilatation. Pancreas: Unremarkable. No pancreatic ductal dilatation or surrounding inflammatory changes. Spleen: Normal in size without focal abnormality. Adrenals/Urinary Tract: Adrenal glands within normal limits. Kidneys show no hydronephrosis. Cyst in the right kidney. The bladder is unremarkable Stomach/Bowel: The stomach is nonenlarged. Negative appendix. Wall thickening and inflammatory change involving distal small bowel in the pelvis concerning for enteritis. Vascular/Lymphatic: Nonaneurysmal aorta. Mild aortic atherosclerosis. Possible mild mural thickening of the abdominal aorta, external iliac vessels and common  femoral vessels. No suspicious nodes. Reproductive: Status post prostatectomy. Other: Small free fluid in the pelvis.  No free  air. Musculoskeletal: No acute or suspicious osseous abnormality IMPRESSION: 1. Prominent wall thickening involving distal small bowel in the pelvis with associated inflammatory changes, consistent with enteritis of infectious, inflammatory, or ischemic etiology. 2. Abnormal appearance of the aortic arch and proximal descending thoracic aorta which demonstrates indistinct mural thickening and surrounding inflammatory change, suspicious for aortitis/vasculitis. This could be secondary to infectious etiologies as well as autoimmune disease. There is no aneurysmal dilatation at this time. 3. Small free fluid in the pelvis Electronically Signed   By: Donavan Foil M.D.   On: 02/24/2021 23:31   US Carotid Bilateral  Result Date: 02/25/2021 CLINICAL DATA:  Vasculitis questioned on previous CT. History of hypertension and hyperlipidemia. Former smoker. EXAM: BILATERAL CAROTID DUPLEX ULTRASOUND TECHNIQUE: Pearline Cables scale imaging, color Doppler and duplex ultrasound were performed of bilateral carotid and vertebral arteries in the neck. COMPARISON:  Carotid Doppler ultrasound-02/10/2016; CT the chest, abdomen and pelvis-02/24/2021 FINDINGS: Criteria: Quantification of carotid stenosis is based on velocity parameters that correlate the residual internal carotid diameter with NASCET-based stenosis levels, using the diameter of the distal internal carotid lumen as the denominator for stenosis measurement. The following velocity measurements were obtained: RIGHT ICA: 80/19 cm/sec CCA: 939/03 cm/sec SYSTOLIC ICA/CCA RATIO:  0.6 ECA: 104 cm/sec LEFT ICA: 91/24 cm/sec CCA: 00/92 cm/sec SYSTOLIC ICA/CCA RATIO:  0.9 ECA: 110 cm/sec RIGHT CAROTID ARTERY: There is mild tortuosity involving the right common carotid artery (images 4 and 8). There is Nickey Canedo moderate amount of eccentric mixed echogenic plaque within the  right carotid bulb (image 14 and 16), extending to involve the origin and proximal aspects of the right internal carotid artery (image 24), progressed compared to the 2017 examination though not resulting in elevated peak systolic velocities within the interrogated course of the right internal carotid artery to suggest Jovane Foutz hemodynamically significant stenosis. No discrete areas of vessel irregularity or abnormal wall thickening. RIGHT VERTEBRAL ARTERY:  Antegrade flow LEFT CAROTID ARTERY: There is Hetty Linhart moderate amount of eccentric hypoechoic plaque involving the distal aspect the left common carotid artery (image 44 and 45). There is Shante Archambeault moderate amount of eccentric echogenic partially shadowing plaque within the left carotid bulb (image 50), extending to involve the origin and proximal aspects of the left internal carotid artery (image 58), unchanged to slightly progressed compared to the 2017 examination though not resulting in elevated peak systolic velocities within the interrogated course of the left internal carotid artery to suggest Jennife Zaucha hemodynamically significant stenosis. No discrete areas of vessel irregularity or thickening. LEFT VERTEBRAL ARTERY:  Antegrade flow IMPRESSION: 1. Moderate amount of bilateral atherosclerotic plaque, unchanged to slightly progressed compared to remote examination performed in 2017, though not resulting in Damia Bobrowski hemodynamically significant stenosis within either internal carotid artery. 2. No sonographic evidence of vessel wall irregularity or thickening to suggest Sefora Tietje vasculitis affecting the carotid arteries. Further evaluation with CTA could be performed as indicated. Electronically Signed   By: Sandi Mariscal M.D.   On: 02/25/2021 16:03   DG Chest Port 1 View  Result Date: 02/24/2021 CLINICAL DATA:  Sepsis EXAM: PORTABLE CHEST 1 VIEW COMPARISON:  11/28/2018 FINDINGS: The heart size and mediastinal contours are within normal limits. Both lungs are clear. The visualized skeletal structures  are unremarkable. IMPRESSION: No active disease. Electronically Signed   By: Fidela Salisbury MD   On: 02/24/2021 18:04        Scheduled Meds: . amLODipine  5 mg Oral Daily  . brimonidine  1 drop Left Eye TID  . feeding  supplement  237 mL Oral BID BM  . heparin  5,000 Units Subcutaneous Q8H  . pantoprazole (PROTONIX) IV  40 mg Intravenous Q24H  . prednisoLONE acetate  1 drop Left Eye QID  . predniSONE  60 mg Oral Q breakfast  . timolol  1 drop Left Eye BID   Continuous Infusions: . lactated ringers 75 mL/hr at 02/25/21 1637  . piperacillin-tazobactam (ZOSYN)  IV 3.375 g (02/25/21 1638)     LOS: 0 days    Time spent: over 4 min    Fayrene Helper, MD [facility]   To contact the attending provider between 7A-7P or the covering provider during after hours 7P-7A, please log into the web site [email or web address] and access using universal [place] password for that web site. If you do not have the password, please call the hospital operator.  02/25/2021, 6:46 PM

## 2021-02-26 ENCOUNTER — Inpatient Hospital Stay (HOSPITAL_COMMUNITY): Payer: Medicare Other

## 2021-02-26 DIAGNOSIS — I776 Arteritis, unspecified: Secondary | ICD-10-CM

## 2021-02-26 DIAGNOSIS — R4182 Altered mental status, unspecified: Secondary | ICD-10-CM

## 2021-02-26 LAB — CBC WITH DIFFERENTIAL/PLATELET
Abs Immature Granulocytes: 0.14 10*3/uL — ABNORMAL HIGH (ref 0.00–0.07)
Basophils Absolute: 0 10*3/uL (ref 0.0–0.1)
Basophils Relative: 0 %
Eosinophils Absolute: 0 10*3/uL (ref 0.0–0.5)
Eosinophils Relative: 0 %
HCT: 31.6 % — ABNORMAL LOW (ref 39.0–52.0)
Hemoglobin: 10.1 g/dL — ABNORMAL LOW (ref 13.0–17.0)
Immature Granulocytes: 1 %
Lymphocytes Relative: 6 %
Lymphs Abs: 1 10*3/uL (ref 0.7–4.0)
MCH: 29 pg (ref 26.0–34.0)
MCHC: 32 g/dL (ref 30.0–36.0)
MCV: 90.8 fL (ref 80.0–100.0)
Monocytes Absolute: 0.8 10*3/uL (ref 0.1–1.0)
Monocytes Relative: 5 %
Neutro Abs: 15 10*3/uL — ABNORMAL HIGH (ref 1.7–7.7)
Neutrophils Relative %: 88 %
Platelets: 551 10*3/uL — ABNORMAL HIGH (ref 150–400)
RBC: 3.48 MIL/uL — ABNORMAL LOW (ref 4.22–5.81)
RDW: 13.2 % (ref 11.5–15.5)
WBC: 16.9 10*3/uL — ABNORMAL HIGH (ref 4.0–10.5)
nRBC: 0 % (ref 0.0–0.2)

## 2021-02-26 LAB — CULTURE, BLOOD (ROUTINE X 2)
Culture: NO GROWTH
Culture: NO GROWTH
Special Requests: ADEQUATE
Special Requests: ADEQUATE

## 2021-02-26 LAB — ANA W/REFLEX IF POSITIVE: Anti Nuclear Antibody (ANA): NEGATIVE

## 2021-02-26 LAB — COMPREHENSIVE METABOLIC PANEL
ALT: 129 U/L — ABNORMAL HIGH (ref 0–44)
AST: 63 U/L — ABNORMAL HIGH (ref 15–41)
Albumin: 1.9 g/dL — ABNORMAL LOW (ref 3.5–5.0)
Alkaline Phosphatase: 245 U/L — ABNORMAL HIGH (ref 38–126)
Anion gap: 9 (ref 5–15)
BUN: 31 mg/dL — ABNORMAL HIGH (ref 8–23)
CO2: 22 mmol/L (ref 22–32)
Calcium: 8.3 mg/dL — ABNORMAL LOW (ref 8.9–10.3)
Chloride: 108 mmol/L (ref 98–111)
Creatinine, Ser: 1.29 mg/dL — ABNORMAL HIGH (ref 0.61–1.24)
GFR, Estimated: 59 mL/min — ABNORMAL LOW (ref >60)
Glucose, Bld: 140 mg/dL — ABNORMAL HIGH (ref 70–99)
Potassium: 4.1 mmol/L (ref 3.5–5.1)
Sodium: 139 mmol/L (ref 135–145)
Total Bilirubin: 0.6 mg/dL (ref 0.3–1.2)
Total Protein: 6.2 g/dL — ABNORMAL LOW (ref 6.5–8.1)

## 2021-02-26 LAB — PROCALCITONIN: Procalcitonin: 0.8 ng/mL

## 2021-02-26 LAB — C4 COMPLEMENT: Complement C4, Body Fluid: 27 mg/dL (ref 12–38)

## 2021-02-26 LAB — IRON AND TIBC
Iron: 17 ug/dL — ABNORMAL LOW (ref 45–182)
Saturation Ratios: 14 % — ABNORMAL LOW (ref 17.9–39.5)
TIBC: 125 ug/dL — ABNORMAL LOW (ref 250–450)
UIBC: 108 ug/dL

## 2021-02-26 LAB — URINE CULTURE: Culture: NO GROWTH

## 2021-02-26 LAB — PHOSPHORUS: Phosphorus: 4 mg/dL (ref 2.5–4.6)

## 2021-02-26 LAB — FERRITIN: Ferritin: 1128 ng/mL — ABNORMAL HIGH (ref 24–336)

## 2021-02-26 LAB — C3 COMPLEMENT: C3 Complement: 200 mg/dL — ABNORMAL HIGH (ref 82–167)

## 2021-02-26 LAB — MAGNESIUM: Magnesium: 2.5 mg/dL — ABNORMAL HIGH (ref 1.7–2.4)

## 2021-02-26 LAB — GLUCOSE, CAPILLARY: Glucose-Capillary: 193 mg/dL — ABNORMAL HIGH (ref 70–99)

## 2021-02-26 LAB — RHEUMATOID FACTOR: Rheumatoid fact SerPl-aCnc: 23.9 IU/mL — ABNORMAL HIGH (ref <14.0)

## 2021-02-26 LAB — RPR: RPR Ser Ql: NONREACTIVE

## 2021-02-26 MED ORDER — ONDANSETRON HCL 4 MG/2ML IJ SOLN: 4.0000 mg | Freq: Four times a day (QID) | INTRAMUSCULAR | Status: DC | PRN

## 2021-02-26 MED ORDER — METHYLPREDNISOLONE SODIUM SUCC 125 MG IJ SOLR
80.0000 mg | Freq: Every day | INTRAMUSCULAR | Status: DC
  Administered 2021-02-26 – 2021-03-01 (×4): 80 mg via INTRAVENOUS
  Filled 2021-02-26 (×4): qty 2

## 2021-02-26 MED ORDER — SODIUM CHLORIDE 0.9 % IV SOLN: INTRAVENOUS | Status: DC

## 2021-02-26 NOTE — Progress Notes (Signed)
PROGRESS NOTE  Curtis Marquez EZM:[id number] DOB: 05-31-1946 DOA: 02/24/2021 PCP: Monico Blitz, MD  Brief History:  Curtis Cainis a 75 y.o.malewith medical history significant forhypertension, glaucoma, s/p brain tumor excision (1994), prostate cancer s/p robotic assisted laparoscopic radical prostatectomy (January 2015) who presents to the emergency department via EMS due to generalized weakness. Per report, patient has had progressive weakness and confusion with poor appetite since last visit to the ED (5/2). Patient was seen in the ED due to left eyepain caused by increased intraocular pressure and was treated for acute glaucoma and discharged home to follow-up with ophthalmologist. He represented with fatigue and general malaise as well as fevers.  Imaging is notable for enteritis and possible vasculitis.  Admitted for further workup.  Assessment/Plan: Systemic Inflammatory Response Syndrome Concern for Vasculitis/Enteritis -continue zosyn for a possible infection -Recurrent fevers, leukocytosis -> meets criteria for SIRS -CT with prominent wall thickening involving distal small bowel in the pelvis with associated inflammatory changes - c/w enteritis of infectious, inflammatory or ischemic etiology - also with abnormal appearance of aortic arch and proximal descending thoracic aorta which demonstrates indistinct mural thickening and surrounding inflammatory change, suspicious for aortitis/vasculitis  -ESR 120, CRP 21.8 -Procal 0.81 -Follow autoimmune labs - ANCA titers, MPO/PR3, ANA,  anti CCP -C3--200 and C4 27 -RF--23.9 -Quant gold, RPR,  -acute hepatitis panel--neg -Discussed with vascular, recommended discussion with ID or rheum.  No rheum in house, discussed with [facility] rheum.  Discussed overall plan.  Noted if concern for GCA, consider steroids, 1 mg/kg.  Can follow outpatient or if worsening can transfer.   Blood cultures--neg to date  -Discussed case with ID recommended  repeating blood cultures.  Consider d/c abx if cx negative at 48 hrs. Carotid dopplers without evidence of vasculitis (moderate bilateral atherosclerotic plaque, no hemodynamically significant stenosis) -Constellation of sx concerning for vasculitis/inflammatory condition.  Of note, discussed with his ophthalmology office who note his exam was c/w uveitis (increased IOP thought 2/2 to this).  His presentation with eye pain, vision loss, concerning for GCA in this presentation, though uveitis doesn't appear to be common finding with this.  No jaw claudication, discomfort with palpation.   -consult general surgery for possible temporal artery biopsy -consult GI--enteritis-->??possible IBD with uveitis -start IV solumedrol  Generalized Weakness  Altered Mental Status -No focal neuro deficits.  A&Ox~2 today when I spoke to him. Suspect this is 2/2 above.   -Head CT with chronic small vessel ischemic changes.  Elevated LFT's Follow acute hepatitis panel  -RUQ ultrasound    Acute Kidney Injury -Baseline 0.8-1.0 -1.61 at presentation, improving, continue IVF  Glaucoma  Uveitis Continue eye drops      Status is: Inpatient  Remains inpatient appropriate because:Inpatient level of care appropriate due to severity of illness   Dispo: The patient is from: Home              Anticipated d/c is to: Home              Patient currently is not medically stable to d/c.   Difficult to place patient No        Family Communication:  Daughter updated 5/7  Consultants:  General surgery; GI  Code Status:  FULL   DVT Prophylaxis:  [place] Heparin    Procedures: As Listed in Progress Note Above  Antibiotics: Zosyn 5/6>>       Subjective: Patient states he is feeling stronger and better today.  Patient denies fevers, chills, headache, chest pain, dyspnea, nausea, vomiting, diarrhea, abdominal pain, dysuria, hematuria,    Objective: Vitals:   02/25/21 1532 02/25/21 2013  02/26/21 0018 02/26/21 0641  BP:  121/73  137/68  Pulse: 62 67  (!) 59  Resp: _0 Temp:  98.6 F (37 C) 98.7 F (37.1 C) 99 F (37.2 C)  TempSrc:  Oral Oral Oral  SpO2: 98% 98%  99%  Weight:      Height:        Intake/Output Summary (Last 24 hours) at 02/26/2021 1610 Last data filed at 02/26/2021 0400 Gross per 24 hour  Intake 607.44 ml  Output --  Net 607.44 ml   Weight change:  Exam:   General:  Pt is alert, follows commands appropriately, not in acute distress  HEENT: No icterus, No thrush, No neck mass, [place]/AT  Cardiovascular: RRR, S1/S2, no rubs, no gallops  Respiratory: bibasilar rales. No wheeze  Abdomen: Soft/+BS, non tender, non distended, no guarding  Extremities: No edema, No lymphangitis, No petechiae, No rashes, no synovitis   Data Reviewed: I have personally reviewed following labs and imaging studies Basic Metabolic Panel: Recent Labs  Lab 02/21/21 1920 02/24/21 1745 02/25/21 0156 02/25/21 0445  NA 139 139  --  138  K 3.3* 3.6  --  3.2*  CL 101 106  --  107  CO2 26 23  --  22  GLUCOSE 125* 110*  --  125*  BUN 12 35*  --  30*  CREATININE 0.88 1.61* 1.40* 1.34*  CALCIUM 8.5* 8.6*  --  7.9*  MG  --   --   --  2.4  PHOS  --   --   --  3.9   Liver Function Tests: Recent Labs  Lab 02/21/21 1920 02/24/21 1745 02/25/21 0445  AST 17 95* 98*  ALT 49* 126* 127*  ALKPHOS 191* 253* 250*  BILITOT 1.0 1.0 1.3*  PROT 8.0 7.3 6.2*  ALBUMIN 3.0* 2.4* 2.1*   Recent Labs  Lab 02/21/21 1920  LIPASE 23   No results for input(s): AMMONIA in the last 168 hours. Coagulation Profile: Recent Labs  Lab 02/24/21 1745 02/25/21 0445  INR 1.2 1.3*   CBC: Recent Labs  Lab 02/21/21 1920 02/24/21 1745 02/25/21 0156 02/25/21 0445 02/26/21 0629  WBC 16.7* 15.7* 17.2* 17.3* 16.9*  NEUTROABS 14.1* 13.2*  --   --  15.0*  HGB 12.9* 12.1* 10.4* 10.2* 10.1*  HCT 39.9 37.7* 32.6* 32.0* 31.6*  MCV 90.1 90.6 90.6 90.4 90.8  PLT 604* 641* 509* 494*  551*   Cardiac Enzymes: No results for input(s): CKTOTAL, CKMB, CKMBINDEX, TROPONINI in the last 168 hours. BNP: Invalid input(s): POCBNP CBG: No results for input(s): GLUCAP in the last 168 hours. HbA1C: No results for input(s): HGBA1C in the last 72 hours. Urine analysis:    Component Value Date/Time   COLORURINE YELLOW 02/25/2021 0118   APPEARANCEUR CLEAR 02/25/2021 0118   LABSPEC >1.046 (H) 02/25/2021 0118   PHURINE 5.0 02/25/2021 0118   GLUCOSEU NEGATIVE 02/25/2021 0118   HGBUR NEGATIVE 02/25/2021 0118   BILIRUBINUR NEGATIVE 02/25/2021 0118   KETONESUR 5 (A) 02/25/2021 0118   PROTEINUR NEGATIVE 02/25/2021 0118   NITRITE NEGATIVE 02/25/2021 0118   LEUKOCYTESUR NEGATIVE 02/25/2021 0118   Sepsis Labs: _1 (procalcitonin:4,lacticidven:4) ) Recent Results (from the past 240 hour(s))  Culture, blood (routine x 2)     Status: None (Preliminary result)   Collection Time: 02/21/21  7:30 PM  Specimen: BLOOD  Result Value Ref Range Status   Specimen Description BLOOD RIGHT ANTECUBITAL  Final   Special Requests   Final    BOTTLES DRAWN AEROBIC AND ANAEROBIC Blood Culture adequate volume   Culture   Final    NO GROWTH 4 DAYS Performed at [facility], [address] [place], [place] [address]    Report Status PENDING  Incomplete  Culture, blood (routine x 2)     Status: None (Preliminary result)   Collection Time: 02/21/21  7:30 PM   Specimen: BLOOD RIGHT FOREARM  Result Value Ref Range Status   Specimen Description BLOOD RIGHT FOREARM  Final   Special Requests   Final    BOTTLES DRAWN AEROBIC AND ANAEROBIC Blood Culture adequate volume   Culture   Final    NO GROWTH 4 DAYS Performed at [facility], [address] [place], [place] [address]    Report Status PENDING  Incomplete  Urine culture     Status: Abnormal   Collection Time: 02/21/21  9:49 PM   Specimen: Urine, Clean Catch  Result Value Ref Range Status   Specimen Description   Final    URINE, CLEAN  CATCH Performed at [facility], [address] [place], [place] [address]    Special Requests   Final    NONE Performed at [facility], [address] [place], [place] [address]    Culture (A)  Final    10,000 COLONIES/mL AEROCOCCUS URINAE Standardized susceptibility testing for this organism is not available. Performed at [facility], [place] [address] [place], [place] [address]    Report Status 02/25/2021 FINAL  Final  SARS CORONAVIRUS 2 (Dawn Convery 6-24 HRS) Nasopharyngeal Nasopharyngeal Swab     Status: None   Collection Time: 02/24/21  5:32 PM   Specimen: Nasopharyngeal Swab  Result Value Ref Range Status   SARS Coronavirus 2 NEGATIVE NEGATIVE Final    Comment: (NOTE) SARS-CoV-2 target nucleic acids are NOT DETECTED.  The SARS-CoV-2 RNA is generally detectable in upper and lower respiratory specimens during the acute phase of infection. Negative results do not preclude SARS-CoV-2 infection, do not rule out co-infections with other pathogens, and should not be used as the sole basis for treatment or other patient management decisions. Negative results must be combined with clinical observations, patient history, and epidemiological information. The expected result is Negative.  Fact Sheet for Patients: SugarRoll.be  Fact Sheet for Healthcare Providers: https://www.woods-mathews.com/  This test is not yet approved or cleared by the [place] FDA and  has been authorized for detection and/or diagnosis of SARS-CoV-2 by FDA under an Emergency Use Authorization (EUA). This EUA will remain  in effect (meaning this test can be used) for the duration of the COVID-19 declaration under Se ction 564(b)(1) of the Act, 21 U.S.C. section 360bbb-3(b)(1), unless the authorization is terminated or revoked sooner.  Performed at [facility], [place] [address], [place], [place] [address]   Blood Culture (routine x 2)     Status: None  (Preliminary result)   Collection Time: 02/24/21  5:45 PM   Specimen: BLOOD LEFT ARM  Result Value Ref Range Status   Specimen Description BLOOD LEFT ARM  Final   Special Requests   Final    BOTTLES DRAWN AEROBIC AND ANAEROBIC Blood Culture adequate volume   Culture   Final    NO GROWTH < 12 HOURS Performed at [facility], [address], [place], [place] [address]    Report Status  PENDING  Incomplete  Blood Culture (routine x 2)     Status: None (Preliminary result)   Collection Time: 02/24/21  5:45 PM   Specimen: BLOOD RIGHT ARM  Result Value Ref Range Status   Specimen Description BLOOD RIGHT ARM  Final   Special Requests   Final    BOTTLES DRAWN AEROBIC AND ANAEROBIC Blood Culture adequate volume   Culture   Final    NO GROWTH < 12 HOURS Performed at [facility], [address] [place], [place] [address]    Report Status PENDING  Incomplete  Resp Panel by RT-PCR (Flu A&B, Covid) Nasopharyngeal Swab     Status: None   Collection Time: 02/24/21  9:06 PM   Specimen: Nasopharyngeal Swab; Nasopharyngeal(NP) swabs in vial transport medium  Result Value Ref Range Status   SARS Coronavirus 2 by RT PCR NEGATIVE NEGATIVE Final    Comment: (NOTE) SARS-CoV-2 target nucleic acids are NOT DETECTED.  The SARS-CoV-2 RNA is generally detectable in upper respiratory specimens during the acute phase of infection. The lowest concentration of SARS-CoV-2 viral copies this assay can detect is 138 copies/mL. A negative result does not preclude SARS-Cov-2 infection and should not be used as the sole basis for treatment or other patient management decisions. A negative result may occur with  improper specimen collection/handling, submission of specimen other than nasopharyngeal swab, presence of viral mutation(s) within the areas targeted by this assay, and inadequate number of viral copies(<138 copies/mL). A negative result must be combined with clinical observations, patient history, and  epidemiological information. The expected result is Negative.  Fact Sheet for Patients:  EntrepreneurPulse.com.au  Fact Sheet for Healthcare Providers:  IncredibleEmployment.be  This test is no t yet approved or cleared by the [place] FDA and  has been authorized for detection and/or diagnosis of SARS-CoV-2 by FDA under an Emergency Use Authorization (EUA). This EUA will remain  in effect (meaning this test can be used) for the duration of the COVID-19 declaration under Section 564(b)(1) of the Act, 21 U.S.C.section 360bbb-3(b)(1), unless the authorization is terminated  or revoked sooner.       Influenza A by PCR NEGATIVE NEGATIVE Final   Influenza B by PCR NEGATIVE NEGATIVE Final    Comment: (NOTE) The Xpert Xpress SARS-CoV-2/FLU/RSV plus assay is intended as an aid in the diagnosis of influenza from Nasopharyngeal swab specimens and should not be used as a sole basis for treatment. Nasal washings and aspirates are unacceptable for Xpert Xpress SARS-CoV-2/FLU/RSV testing.  Fact Sheet for Patients: EntrepreneurPulse.com.au  Fact Sheet for Healthcare Providers: IncredibleEmployment.be  This test is not yet approved or cleared by the [place] FDA and has been authorized for detection and/or diagnosis of SARS-CoV-2 by FDA under an Emergency Use Authorization (EUA). This EUA will remain in effect (meaning this test can be used) for the duration of the COVID-19 declaration under Section 564(b)(1) of the Act, 21 U.S.C. section 360bbb-3(b)(1), unless the authorization is terminated or revoked.  Performed at [facility], [address] [place], [place] [address]   Culture, blood (routine x 2)     Status: None (Preliminary result)   Collection Time: 02/25/21  5:03 PM   Specimen: Right Antecubital; Blood  Result Value Ref Range Status   Specimen Description RIGHT ANTECUBITAL  Final   Special  Requests   Final    Blood Culture adequate volume BOTTLES DRAWN AEROBIC AND ANAEROBIC Performed at [facility], [address], [place], [place] [address]    Culture PENDING  Incomplete   Report Status PENDING  Incomplete  Culture, blood (routine x 2)     Status: None (Preliminary result)   Collection Time: 02/25/21  5:03 PM   Specimen: BLOOD RIGHT ARM  Result Value Ref Range Status   Specimen Description BLOOD RIGHT ARM  Final   Special Requests   Final    Blood Culture results may not be optimal due to an inadequate volume of blood received in culture bottles BOTTLES DRAWN AEROBIC ONLY Performed at [facility], [address] [place], [place] [address]    Culture PENDING  Incomplete   Report Status PENDING  Incomplete     Scheduled Meds: . amLODipine  5 mg Oral Daily  . brimonidine  1 drop Left Eye TID  . feeding supplement  237 mL Oral BID BM  . heparin  5,000 Units Subcutaneous Q8H  . methylPREDNISolone (SOLU-MEDROL) injection  80 mg Intravenous Daily  . pantoprazole (PROTONIX) IV  40 mg Intravenous Q24H  . prednisoLONE acetate  1 drop Left Eye QID  . timolol  1 drop Left Eye BID   Continuous Infusions: . lactated ringers 75 mL/hr at 02/25/21 1637  . piperacillin-tazobactam (ZOSYN)  IV 3.375 g (02/25/21 2325)    Procedures/Studies: CT Head Wo Contrast  Result Date: 02/24/2021 CLINICAL DATA:  Mental status change. Weakness, loss of appetite and fever. EXAM: CT HEAD WITHOUT CONTRAST TECHNIQUE: Contiguous axial images were obtained from the base of the skull through the vertex without intravenous contrast. COMPARISON:  02/09/2016 FINDINGS: Brain: No evidence of acute infarction, hemorrhage, hydrocephalus, extra-axial collection or mass lesion/mass effect. There is mild diffuse low-attenuation within the subcortical and periventricular white matter compatible with chronic microvascular disease. Vascular: No hyperdense vessel or unexpected calcification. Skull: Previous right  frontal craniotomy. Negative for fracture or focal lesion. Sinuses/Orbits: No acute finding. Other: None. IMPRESSION: 1. No acute intracranial abnormalities. 2. Chronic small vessel ischemic change and brain atrophy. Electronically Signed   By: Kerby Moors M.D.   On: 02/24/2021 18:54   CT CHEST ABDOMEN PELVIS W CONTRAST  Result Date: 02/24/2021 CLINICAL DATA:  Abdomen pain fever and leukocytosis EXAM: CT CHEST, ABDOMEN, AND PELVIS WITH CONTRAST TECHNIQUE: Multidetector CT imaging of the chest, abdomen and pelvis was performed following the standard protocol during bolus administration of intravenous contrast. CONTRAST:  90m OMNIPAQUE IOHEXOL 300 MG/ML  SOLN COMPARISON:  CT 02/09/2016 FINDINGS: CT CHEST FINDINGS Cardiovascular: Mild aortic atherosclerosis. Indistinct wall thickening at the great vessel origins aortic arch and proximal descending aorta. No dissection is seen. There is no aneurysmal formation. Mild aortic atherosclerosis. Cardiomegaly. Common origin of the left common carotid artery and brachiocephalic artery. Cardiomegaly. Trace pericardial effusion. Mediastinum/Nodes: Midline trachea. No thyroid mass. No suspicious adenopathy. Esophagus within normal limits Lungs/Pleura: No consolidation or pneumothorax. No significant pleural effusion Musculoskeletal: No acute or suspicious osseous abnormality. CT ABDOMEN PELVIS FINDINGS Hepatobiliary: Subcentimeter hypodensity within the anterior liver too small to further characterize. No calcified gallstone or biliary dilatation. Pancreas: Unremarkable. No pancreatic ductal dilatation or surrounding inflammatory changes. Spleen: Normal in size without focal abnormality. Adrenals/Urinary Tract: Adrenal glands within normal limits. Kidneys show no hydronephrosis. Cyst in the right kidney. The bladder is unremarkable Stomach/Bowel: The stomach is nonenlarged. Negative appendix. Wall thickening and inflammatory change involving distal small bowel in the pelvis  concerning for enteritis. Vascular/Lymphatic: Nonaneurysmal aorta. Mild aortic atherosclerosis. Possible mild mural thickening of the abdominal aorta, external iliac vessels and common femoral vessels. No suspicious nodes. Reproductive: Status post prostatectomy. Other: Small free  fluid in the pelvis.  No free air. Musculoskeletal: No acute or suspicious osseous abnormality IMPRESSION: 1. Prominent wall thickening involving distal small bowel in the pelvis with associated inflammatory changes, consistent with enteritis of infectious, inflammatory, or ischemic etiology. 2. Abnormal appearance of the aortic arch and proximal descending thoracic aorta which demonstrates indistinct mural thickening and surrounding inflammatory change, suspicious for aortitis/vasculitis. This could be secondary to infectious etiologies as well as autoimmune disease. There is no aneurysmal dilatation at this time. 3. Small free fluid in the pelvis Electronically Signed   By: Donavan Foil M.D.   On: 02/24/2021 23:31   US Carotid Bilateral  Result Date: 02/25/2021 CLINICAL DATA:  Vasculitis questioned on previous CT. History of hypertension and hyperlipidemia. Former smoker. EXAM: BILATERAL CAROTID DUPLEX ULTRASOUND TECHNIQUE: Pearline Cables scale imaging, color Doppler and duplex ultrasound were performed of bilateral carotid and vertebral arteries in the neck. COMPARISON:  Carotid Doppler ultrasound-02/10/2016; CT the chest, abdomen and pelvis-02/24/2021 FINDINGS: Criteria: Quantification of carotid stenosis is based on velocity parameters that correlate the residual internal carotid diameter with NASCET-based stenosis levels, using the diameter of the distal internal carotid lumen as the denominator for stenosis measurement. The following velocity measurements were obtained: RIGHT ICA: 80/19 cm/sec CCA: 664/40 cm/sec SYSTOLIC ICA/CCA RATIO:  0.6 ECA: 104 cm/sec LEFT ICA: 91/24 cm/sec CCA: 34/74 cm/sec SYSTOLIC ICA/CCA RATIO:  0.9 ECA: 110  cm/sec RIGHT CAROTID ARTERY: There is mild tortuosity involving the right common carotid artery (images 4 and 8). There is a moderate amount of eccentric mixed echogenic plaque within the right carotid bulb (image 14 and 16), extending to involve the origin and proximal aspects of the right internal carotid artery (image 24), progressed compared to the 2017 examination though not resulting in elevated peak systolic velocities within the interrogated course of the right internal carotid artery to suggest a hemodynamically significant stenosis. No discrete areas of vessel irregularity or abnormal wall thickening. RIGHT VERTEBRAL ARTERY:  Antegrade flow LEFT CAROTID ARTERY: There is a moderate amount of eccentric hypoechoic plaque involving the distal aspect the left common carotid artery (image 44 and 45). There is a moderate amount of eccentric echogenic partially shadowing plaque within the left carotid bulb (image 50), extending to involve the origin and proximal aspects of the left internal carotid artery (image 58), unchanged to slightly progressed compared to the 2017 examination though not resulting in elevated peak systolic velocities within the interrogated course of the left internal carotid artery to suggest a hemodynamically significant stenosis. No discrete areas of vessel irregularity or thickening. LEFT VERTEBRAL ARTERY:  Antegrade flow IMPRESSION: 1. Moderate amount of bilateral atherosclerotic plaque, unchanged to slightly progressed compared to remote examination performed in 2017, though not resulting in a hemodynamically significant stenosis within either internal carotid artery. 2. No sonographic evidence of vessel wall irregularity or thickening to suggest a vasculitis affecting the carotid arteries. Further evaluation with CTA could be performed as indicated. Electronically Signed   By: Sandi Mariscal M.D.   On: 02/25/2021 16:03   DG Chest Port 1 View  Result Date: 02/24/2021 CLINICAL DATA:   Sepsis EXAM: PORTABLE CHEST 1 VIEW COMPARISON:  11/28/2018 FINDINGS: The heart size and mediastinal contours are within normal limits. Both lungs are clear. The visualized skeletal structures are unremarkable. IMPRESSION: No active disease. Electronically Signed   By: Fidela Salisbury MD   On: 02/24/2021 18:04    Orson Eva, DO  [facility]  If 7PM-7AM, please contact night-coverage [email or web address] Password [email or web address] 02/26/2021, 9:03 AM  LOS: 1 day

## 2021-02-26 NOTE — Consult Note (Signed)
Curtis Marquez, M.D. Gastroenterology & Hepatology                                           Patient Name: Curtis Marquez Account #: @[id number] @   MRN: [id number] Admission Date: 02/24/2021 Date of Evaluation:  02/26/2021 Time of Evaluation: 10:49 AM   Referring Physician: Orson Eva, MD  Chief Complaint: Enteritis  HPI:  This is a 75 y.o. male with history of hypertension, glaucoma, brain tumor s/p resection, prostate cancer s/p resection, hypertension and hyperlipidemia, who came to the hospital after presenting progressive weakness and new onset of decreased visual acuity in his left eye.  The patient states that he is ago he presented new onset of decreased visual acuity on his left eye and decided to come to the hospital for this.  He has only presented some weakness recently but the patient denies having any fever, nausea, vomiting, abdominal pain, diarrhea, melena or hematochezia.  He denies having any abdominal complaints at the moment.  Gastroenterology was consulted as he was found to have presence of enteritis in CT scan performed in the ED.  In the ED was found to have a low-grade fever of 100.7, he was hemodynamically stable otherwise.  Labs upon admission were remarkable for white blood cell count of 15.7, normal hemoglobin of 12.1 and increased platelets of 641.  Lactic acid was 1.2, CMP showed elevated ALT of 126, AST 95, alkaline phosphatase 253, total bilirubin 1.0, normal electrolytes, creatinine was elevated 1.61 and BUN was 35.  INR was 1.2.  The patient was presenting some confusion and underwent a head CT without IV contrast which showed chronic small vessel ischemic changes and brain atrophy but no acute changes.  CT of the abdomen, pelvis and chest was performed for evaluation of his leukocytosis and fever, which showed presence of thickening of the distal small bowel concerning for inflammation, also there was presence of the aortic arch and proximal descending thoracic aorta  suspicious for aortitis/vasculitis.  Today, his ferritin was elevated 1128, with decreased iron saturation of 14% and iron of 17.  CMP showed AST of 63, ALT 129, alkaline phosphatase 245, total bilirubin 0.6, persistent leukocytosis of 16,900, procalcitonin was elevated 0.8 with very elevated CRP of 21 and ESR of 120.  C3 is elevated 200 and C4 was normal 27.  His ANA was negative.  Notably, the patient came to the ER on 02/21/2021 due to 2 eye pain and decreased vision complaints.  Patient was considered to have acute angle we will perform which improved with the use of acetazolamide.  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  Patient reports having a previous colonoscopy in the past but not recently, states that he was cleared to 10 years ago.  No reports are available Past Medical History: SEE CHRONIC ISSSUES: Past Medical History:  Diagnosis Date  . Brain tumor ([place])   . GERD (gastroesophageal reflux disease)   . Hypercholesteremia   . Hypertension   . Prostate cancer ([place]) 11/13/13   Past Surgical History:  Past Surgical History:  Procedure Laterality Date  . BRAIN TUMOR EXCISION  1994  . ESOPHAGOGASTRODUODENOSCOPY N/A 03/18/2019   Procedure: ESOPHAGOGASTRODUODENOSCOPY (EGD);  Surgeon: Danie Binder, MD;  Location: [facility];  Service: Endoscopy;  Laterality: N/A;  .  ESOPHAGOGASTRODUODENOSCOPY N/A 03/24/2019   Procedure: ESOPHAGOGASTRODUODENOSCOPY (EGD);  Surgeon: Danie Binder, MD;  Location: [facility];  Service: Endoscopy;  Laterality: N/A;  8:30AM - pt had Covid test done in ED & has not been anywhere since, office spoke with him and he understands to remain quarantined  . LYMPHADENECTOMY Bilateral 11/13/2013   Procedure: LYMPHADENECTOMY "PELVIC LYMPH NODE DISSECTION";  Surgeon: Dutch Gray, MD;  Location: [facility];  Service: Urology;   Laterality: Bilateral;  . ROBOT ASSISTED LAPAROSCOPIC RADICAL PROSTATECTOMY N/A 11/13/2013   Procedure: ROBOTIC ASSISTED LAPAROSCOPIC RADICAL PROSTATECTOMY LEVEL 2;  Surgeon: Dutch Gray, MD;  Location: [facility];  Service: Urology;  Laterality: N/A;  . SAVORY DILATION N/A 03/24/2019   Procedure: SAVORY DILATION;  Surgeon: Danie Binder, MD;  Location: [facility];  Service: Endoscopy;  Laterality: N/A;   Family History:  Family History  Problem Relation Age of Onset  . CVA Father   . Diabetes Mother   . Hypertension Mother   . Diabetes Sister   . Hypertension Sister   . Cancer Brother    Social History:  Social History   Tobacco Use  . Smoking status: Former Research scientist (life sciences)  . Smokeless tobacco: Never Used  Vaping Use  . Vaping Use: Never used  Substance Use Topics  . Alcohol use: No    Alcohol/week: 0.0 standard drinks  . Drug use: No    Home Medications:  Prior to Admission medications   Medication Sig Start Date End Date Taking? Authorizing Provider  amLODipine (NORVASC) 5 MG tablet Take 1 tablet by mouth daily. 02/15/21  Yes [provider]  brimonidine (ALPHAGAN) 0.2 % ophthalmic solution Place 1 drop into the left eye in the morning and at bedtime.   Yes [provider]  cloNIDine (CATAPRES) 0.3 MG tablet Take 0.3 mg by mouth 2 (two) times daily. 01/15/21  Yes [provider]  levocetirizine (XYZAL) 5 MG tablet Take 5 mg by mouth every evening.   Yes [provider]  olmesartan (BENICAR) 40 MG tablet Take 40 mg by mouth daily. 02/10/19  Yes [provider]  prednisoLONE acetate (PRED FORTE) 1 % ophthalmic suspension Place 1 drop into the left eye 4 (four) times daily. 02/22/21  Yes [provider]  timolol (BETIMOL) 0.5 % ophthalmic solution Place 1 drop into the left eye 2 (two) times daily.   Yes [provider]  acetaZOLAMIDE (DIAMOX) 250 MG tablet Take 2 tablets (500 mg total) by mouth 2 (two) times daily for 2  days. Patient not taking: Reported on 02/24/2021 02/21/21 02/23/21  Margette Fast, MD  amoxicillin (AMOXIL) 500 MG tablet 2 PO BID FOR 10 DAYS Patient not taking: Reported on 02/24/2021 03/24/19   Danie Binder, MD  buPROPion [facility] SR) 100 MG 12 hr tablet Take 100 mg by mouth daily. Patient not taking: Reported on 02/24/2021 02/04/19   [provider]  clarithromycin (BIAXIN) 500 MG tablet 1 PO BID FOR 10 DAYS. Patient not taking: Reported on 02/24/2021 03/24/19   Danie Binder, MD  clonazePAM (KLONOPIN) 0.5 MG tablet Take 0.5 mg by mouth daily as needed for anxiety.  Patient not taking: Reported on 02/24/2021 02/04/19   [provider]  cloNIDine (CATAPRES) 0.1 MG tablet Take 0.3 mg by mouth 2 (two) times daily. Patient not taking: Reported on 02/24/2021 02/13/19   [provider]  hydrALAZINE (APRESOLINE) 50 MG tablet Take 50 mg by mouth 2 (two) times a day.  Patient not taking: Reported  on 02/24/2021 01/28/19   [provider]  losartan (COZAAR) 100 MG tablet Take 100 mg by mouth daily.  Patient not taking: Reported on 02/24/2021    [provider]  meloxicam (MOBIC) 15 MG tablet Take 1 tablet by mouth daily. Patient not taking: No sig reported 02/15/21   [provider]  omeprazole (PRILOSEC) 20 MG capsule 1 PO 30 mins prior to breakfast and supper Patient not taking: Reported on 02/24/2021 03/18/19   Danie Binder, MD    Inpatient Medications:  Current Facility-Administered Medications:  .  acetaminophen (TYLENOL) tablet 650 mg, 650 mg, Oral, Q6H PRN, Adefeso, Oladapo, DO, 650 mg at 02/25/21 0805 .  amLODipine (NORVASC) tablet 5 mg, 5 mg, Oral, Daily, Adefeso, Oladapo, DO, 5 mg at 02/26/21 1028 .  brimonidine (ALPHAGAN) 0.2 % ophthalmic solution 1 drop, 1 drop, Left Eye, TID, Adefeso, Oladapo, DO, 1 drop at 02/26/21 1028 .  feeding supplement (ENSURE ENLIVE / ENSURE PLUS) liquid 237 mL, 237 mL, Oral, BID BM, Adefeso, Oladapo, DO, 237 mL at 02/25/21  1504 .  heparin injection 5,000 Units, 5,000 Units, Subcutaneous, Q8H, Adefeso, Oladapo, DO, 5,000 Units at 02/26/21 0510 .  lactated ringers infusion, , Intravenous, Continuous, Elodia Florence., MD, Last Rate: 75 mL/hr at 02/25/21 1637, New Bag at 02/25/21 1637 .  methylPREDNISolone sodium succinate (SOLU-MEDROL) 125 mg/2 mL injection 80 mg, 80 mg, Intravenous, Daily, Tat, David, MD, 80 mg at 02/26/21 1046 .  ondansetron (ZOFRAN) injection 4 mg, 4 mg, Intravenous, Q6H PRN, Tat, David, MD .  pantoprazole (PROTONIX) injection 40 mg, 40 mg, Intravenous, Q24H, Elodia Florence., MD, 40 mg at 02/25/21 1639 .  piperacillin-tazobactam (ZOSYN) IVPB 3.375 g, 3.375 g, Intravenous, Q8H, Adefeso, Oladapo, DO, Last Rate: 12.5 mL/hr at 02/26/21 1048, 3.375 g at 02/26/21 1048 .  prednisoLONE acetate (PRED FORTE) 1 % ophthalmic suspension 1 drop, 1 drop, Left Eye, QID, Adefeso, Oladapo, DO, 1 drop at 02/25/21 2130 .  timolol (TIMOPTIC) 0.5 % ophthalmic solution 1 drop, 1 drop, Left Eye, BID, Adefeso, Oladapo, DO, 1 drop at 02/26/21 1029 Allergies: Patient has no known allergies.  Complete Review of Systems: GENERAL: negative for malaise, night sweats HEENT: No changes in hearing or vision, no nose bleeds or other nasal problems. NECK: Negative for lumps, goiter, pain and significant neck swelling RESPIRATORY: Negative for cough, wheezing CARDIOVASCULAR: Negative for chest pain, leg swelling, palpitations, orthopnea GI: SEE HPI MUSCULOSKELETAL: Negative for joint pain or swelling, back pain, and muscle pain. SKIN: Negative for lesions, rash PSYCH: Negative for sleep disturbance, mood disorder and recent psychosocial stressors. HEMATOLOGY Negative for prolonged bleeding, bruising easily, and swollen nodes. ENDOCRINE: Negative for cold or heat intolerance, polyuria, polydipsia and goiter. NEURO: negative for tremor, gait imbalance, syncope and seizures. The remainder of the review of systems is  noncontributory.  Physical Exam: BP 137/68 (BP Location: Right Wrist)   Pulse (!) 59   Temp 99 F (37.2 C) (Oral)   Resp 19   Ht 5' 8"  (1.727 m)   Wt 73.4 kg   SpO2 99%   BMI 24.60 kg/m  GENERAL: The patient is AO x3, in no acute distress. HEENT: Head is normocephalic and atraumatic. EOMI are intact. Mouth is well hydrated and without lesions. NECK: Supple. No masses LUNGS: Clear to auscultation. No presence of rhonchi/wheezing/rales. Adequate chest expansion HEART: RRR, normal s1 and s2. ABDOMEN: Soft, nontender, no guarding, no peritoneal signs, and nondistended. BS +. No masses. EXTREMITIES: Without any cyanosis, clubbing,  rash, lesions or edema. NEUROLOGIC: AOx3, no focal motor deficit. SKIN: no jaundice, no rashes  Laboratory Data CBC:     Component Value Date/Time   WBC 16.9 (H) 02/26/2021 0629   RBC 3.48 (L) 02/26/2021 0629   HGB 10.1 (L) 02/26/2021 0629   HCT 31.6 (L) 02/26/2021 0629   PLT 551 (H) 02/26/2021 0629   MCV 90.8 02/26/2021 0629   MCH 29.0 02/26/2021 0629   MCHC 32.0 02/26/2021 0629   RDW 13.2 02/26/2021 0629   LYMPHSABS 1.0 02/26/2021 0629   MONOABS 0.8 02/26/2021 0629   EOSABS 0.0 02/26/2021 0629   BASOSABS 0.0 02/26/2021 0629   COAG:  Lab Results  Component Value Date   INR 1.3 (H) 02/25/2021   INR 1.2 02/24/2021   INR 1.11 02/10/2016    BMP:  BMP Latest Ref Rng & Units 02/26/2021 02/25/2021 02/25/2021  Glucose 70 - 99 mg/dL 140(H) 125(H) -  BUN 8 - 23 mg/dL 31(H) 30(H) -  Creatinine 0.61 - 1.24 mg/dL 1.29(H) 1.34(H) 1.40(H)  Sodium 135 - 145 mmol/L 139 138 -  Potassium 3.5 - 5.1 mmol/L 4.1 3.2(L) -  Chloride 98 - 111 mmol/L 108 107 -  CO2 22 - 32 mmol/L 22 22 -  Calcium 8.9 - 10.3 mg/dL 8.3(L) 7.9(L) -    HEPATIC:  Hepatic Function Latest Ref Rng & Units 02/26/2021 02/25/2021 02/24/2021  Total Protein 6.5 - 8.1 g/dL 6.2(L) 6.2(L) 7.3  Albumin 3.5 - 5.0 g/dL 1.9(L) 2.1(L) 2.4(L)  AST 15 - 41 U/L 63(H) 98(H) 95(H)  ALT 0 - 44 U/L 129(H) 127(H)  126(H)  Alk Phosphatase 38 - 126 U/L 245(H) 250(H) 253(H)  Total Bilirubin 0.3 - 1.2 mg/dL 0.6 1.3(H) 1.0    CARDIAC:  Lab Results  Component Value Date   TROPONINI <0.03 07/10/2016     Imaging: I personally reviewed and interpreted the available imaging.  Assessment & Plan:  This is a 75 y.o. male with history of hypertension, glaucoma, brain tumor s/p resection, prostate cancer s/p resection, hypertension and hyperlipidemia, who came to the hospital after presenting progressive weakness and new onset of decreased visual acuity in his left eye.  Gastroenterology was consulted after the patient was found to have distal small bowel enteritis in recent CT scan which was performed as part of the evaluation of endoscopic submucosal dissection leukocytosis.  The patient denies having ongoing active gastrointestinal symptoms of the moment.  The patient has a large constellation of symptoms including weakness, changes in vision, possible vasculitis on imaging and elevated liver enzymes which raises concern for multisystemic involvement such as vasculitis, less likely due to lupus.  This could also explain the enteritis he is presenting.  I think it is less likely that this is related to Crohn's disease but it is warranted that he gets a colonoscopy at some point to evaluate this area further.  I consider is appropriate to have evaluation by rheumatology, as well as having further evaluation with autoimmune serologies such as ANCA, IgG, anti-smooth muscle antibody, antidouble-stranded DNA, anti-smooth antibody, antimitochondrial antibody, IgG, as well as evaluation of other reversible causes of elevated liver enzymes by checking acute hepatitis panel.  Based on the investigations and the evaluation by rheumatology we will consider doing an inpatient colonoscopy, versus performing an outpatient procedure as this does not seem to be a symptomatic issue at the moment.  #Enteritis #Multisystemic  inflammation - Check ANCA, IgG, anti-smooth muscle antibody, antidouble-stranded DNA, anti-smooth antibody, antimitochondrial antibody, IgG - Check acute hepatitis panel -  Advance diet as tolerated - Consider rheumatology evaluation - Will plan inpatient vs outpatient colonoscopy based on workup above  Harvel Quale, MD Gastroenterology and Hepatology [facility]

## 2021-02-27 DIAGNOSIS — I776 Arteritis, unspecified: Principal | ICD-10-CM

## 2021-02-27 DIAGNOSIS — K529 Noninfective gastroenteritis and colitis, unspecified: Secondary | ICD-10-CM

## 2021-02-27 LAB — COMPREHENSIVE METABOLIC PANEL
ALT: 145 U/L — ABNORMAL HIGH (ref 0–44)
AST: 69 U/L — ABNORMAL HIGH (ref 15–41)
Albumin: 2.1 g/dL — ABNORMAL LOW (ref 3.5–5.0)
Alkaline Phosphatase: 258 U/L — ABNORMAL HIGH (ref 38–126)
Anion gap: 8 (ref 5–15)
BUN: 33 mg/dL — ABNORMAL HIGH (ref 8–23)
CO2: 27 mmol/L (ref 22–32)
Calcium: 8.4 mg/dL — ABNORMAL LOW (ref 8.9–10.3)
Chloride: 106 mmol/L (ref 98–111)
Creatinine, Ser: 1.18 mg/dL (ref 0.61–1.24)
GFR, Estimated: >60 mL/min (ref >60)
Glucose, Bld: 133 mg/dL — ABNORMAL HIGH (ref 70–99)
Potassium: 4.5 mmol/L (ref 3.5–5.1)
Sodium: 141 mmol/L (ref 135–145)
Total Bilirubin: 0.7 mg/dL (ref 0.3–1.2)
Total Protein: 6.7 g/dL (ref 6.5–8.1)

## 2021-02-27 LAB — CBC
HCT: 34.8 % — ABNORMAL LOW (ref 39.0–52.0)
Hemoglobin: 10.9 g/dL — ABNORMAL LOW (ref 13.0–17.0)
MCH: 28.6 pg (ref 26.0–34.0)
MCHC: 31.3 g/dL (ref 30.0–36.0)
MCV: 91.3 fL (ref 80.0–100.0)
Platelets: 634 10*3/uL — ABNORMAL HIGH (ref 150–400)
RBC: 3.81 MIL/uL — ABNORMAL LOW (ref 4.22–5.81)
RDW: 13.3 % (ref 11.5–15.5)
WBC: 19.7 10*3/uL — ABNORMAL HIGH (ref 4.0–10.5)
nRBC: 0 % (ref 0.0–0.2)

## 2021-02-27 LAB — PROCALCITONIN: Procalcitonin: 0.51 ng/mL

## 2021-02-27 LAB — IGG: IgG (Immunoglobin G), Serum: 970 mg/dL (ref 603–1613)

## 2021-02-27 LAB — GLUCOSE, CAPILLARY: Glucose-Capillary: 110 mg/dL — ABNORMAL HIGH (ref 70–99)

## 2021-02-27 MED ORDER — CHLORHEXIDINE GLUCONATE CLOTH 2 % EX PADS
6.0000 | MEDICATED_PAD | Freq: Once | CUTANEOUS | Status: AC
  Administered 2021-02-27: 6 via TOPICAL

## 2021-02-27 MED ORDER — CEFAZOLIN SODIUM-DEXTROSE 2-4 GM/100ML-% IV SOLN
2.0000 g | INTRAVENOUS | Status: AC
  Administered 2021-02-28: 2 g via INTRAVENOUS
  Filled 2021-02-27: qty 100

## 2021-02-27 NOTE — H&P (View-Only) (Signed)
[facility] Consult  Reason for Consult:  Temporal artery biopsy  Referring Physician:  Dr. Carles Collet   Chief Complaint    Altered Mental Status      HPI: Curtis Marquez is a 75 y.o. male with HTN, glaucoma, brain tumor s/p resection, prostate cancer s/p resection, who came to the hospital with weakness, fever, and confusion in association with eye/ vision complaints consistent with uveitis.  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 He is being worked up for large vessel vasculitis causes like Giant cell arteritis, Takayasu's or Behcet's or IgG4.  He is also on the waitlist for transfer to [facility].  He currently has no complaints. He wants to go home.  He is on antibiotics and steroids currently.  Past Medical History:  Diagnosis Date  . Brain tumor ([place])   . GERD (gastroesophageal reflux disease)   . Hypercholesteremia   . Hypertension   . Prostate cancer ([place]) 11/13/13    Past Surgical History:  Procedure Laterality Date  . BRAIN TUMOR EXCISION  1994  . ESOPHAGOGASTRODUODENOSCOPY N/A 03/18/2019   Procedure: ESOPHAGOGASTRODUODENOSCOPY (EGD);  Surgeon: Danie Binder, MD;  [facility];  Service: Endoscopy;  Laterality: N/A;  . ESOPHAGOGASTRODUODENOSCOPY N/A 03/24/2019   Procedure: ESOPHAGOGASTRODUODENOSCOPY (EGD);  Surgeon: Danie Binder, MD;  Location: [facility];  Service: Endoscopy;  Laterality: N/A;  8:30AM - pt had Covid test done in ED & has not been anywhere since, office spoke with him and he understands to remain quarantined  . LYMPHADENECTOMY Bilateral 11/13/2013   Procedure: LYMPHADENECTOMY "PELVIC LYMPH NODE DISSECTION";  Surgeon: Dutch Gray, MD;  Location: [facility];  Service: Urology;  Laterality: Bilateral;  . ROBOT ASSISTED LAPAROSCOPIC RADICAL PROSTATECTOMY N/A 11/13/2013   Procedure: ROBOTIC ASSISTED LAPAROSCOPIC RADICAL PROSTATECTOMY LEVEL 2;  Surgeon: Dutch Gray, MD;  Location: [facility];  Service: Urology;  Laterality: N/A;   . SAVORY DILATION N/A 03/24/2019   Procedure: SAVORY DILATION;  Surgeon: Danie Binder, MD;  [facility];  Service: Endoscopy;  Laterality: N/A;    Family History  Problem Relation Age of Onset  . CVA Father   . Diabetes Mother   . Hypertension Mother   . Diabetes Sister   . Hypertension Sister   . Cancer Brother     Social History   Tobacco Use  . Smoking status: Former Research scientist (life sciences)  . Smokeless tobacco: Never Used  Vaping Use  . Vaping Use: Never used  Substance Use Topics  . Alcohol use: No    Alcohol/week: 0.0 standard drinks  . Drug use: No    Medications:  I have reviewed the patient's current medications. Prior to Admission:  Medications Prior to Admission  Medication Sig Dispense Refill Last Dose  . amLODipine (NORVASC) 5 MG tablet Take 1 tablet by mouth daily.   02/23/2021 at Unknown time  . brimonidine (ALPHAGAN) 0.2 % ophthalmic solution Place 1 drop into the left eye in the morning and at bedtime.   02/24/2021 at Unknown time  . cloNIDine (CATAPRES) 0.3 MG tablet Take 0.3 mg by mouth 2 (two) times daily.   02/23/2021 at Unknown time  . levocetirizine (XYZAL) 5 MG tablet Take 5 mg by mouth every evening.   Past Month at Unknown time  . olmesartan (BENICAR) 40 MG tablet Take 40 mg by mouth daily.     . prednisoLONE acetate (PRED FORTE) 1 % ophthalmic suspension Place 1 drop into the left eye 4 (four) times daily.  02/24/2021 at Unknown time  . timolol (BETIMOL) 0.5 % ophthalmic solution Place 1 drop into the left eye 2 (two) times daily.   02/24/2021 at Unknown time  . acetaZOLAMIDE (DIAMOX) 250 MG tablet Take 2 tablets (500 mg total) by mouth 2 (two) times daily for 2 days. (Patient not taking: Reported on 02/24/2021) 8 tablet 0 Not Taking at Unknown time  . amoxicillin (AMOXIL) 500 MG tablet 2 PO BID FOR 10 DAYS (Patient not taking: Reported on 02/24/2021) 40 tablet 0 Not Taking at Unknown time  . buPROPion (WELLBUTRIN SR) 100 MG 12 hr tablet Take 100 mg by mouth  daily. (Patient not taking: Reported on 02/24/2021)   Not Taking at Unknown time  . clarithromycin (BIAXIN) 500 MG tablet 1 PO BID FOR 10 DAYS. (Patient not taking: Reported on 02/24/2021) 20 tablet 0 Not Taking at Unknown time  . clonazePAM (KLONOPIN) 0.5 MG tablet Take 0.5 mg by mouth daily as needed for anxiety.  (Patient not taking: Reported on 02/24/2021)   Not Taking at Unknown time  . cloNIDine (CATAPRES) 0.1 MG tablet Take 0.3 mg by mouth 2 (two) times daily. (Patient not taking: Reported on 02/24/2021)   Not Taking at Unknown time  . hydrALAZINE (APRESOLINE) 50 MG tablet Take 50 mg by mouth 2 (two) times a day.  (Patient not taking: Reported on 02/24/2021)   Not Taking at Unknown time  . losartan (COZAAR) 100 MG tablet Take 100 mg by mouth daily.  (Patient not taking: Reported on 02/24/2021)   Not Taking at Unknown time  . meloxicam (MOBIC) 15 MG tablet Take 1 tablet by mouth daily. (Patient not taking: No sig reported)   Not Taking at Unknown time  . omeprazole (PRILOSEC) 20 MG capsule 1 PO 30 mins prior to breakfast and supper (Patient not taking: Reported on 02/24/2021) 60 capsule 11 Not Taking at Unknown time   Scheduled: . amLODipine  5 mg Oral Daily  . brimonidine  1 drop Left Eye TID  . feeding supplement  237 mL Oral BID BM  . heparin  5,000 Units Subcutaneous Q8H  . methylPREDNISolone (SOLU-MEDROL) injection  80 mg Intravenous Daily  . pantoprazole (PROTONIX) IV  40 mg Intravenous Q24H  . prednisoLONE acetate  1 drop Left Eye QID  . timolol  1 drop Left Eye BID   Continuous: . sodium chloride 75 mL/hr at 02/26/21 1458  . piperacillin-tazobactam (ZOSYN)  IV 3.375 g (02/27/21 1001)   CLE:[id number], ondansetron (ZOFRAN) IV  No Known Allergies   ROS:  A comprehensive review of systems was negative except for: Constitutional: positive for weakness Eyes: positive for eye pain and decreased acuity  Blood pressure (!) 144/72, pulse (!) 54, temperature 98.8 F (37.1 C), temperature  source Oral, resp. rate 16, height 5' 8"  (1.727 m), weight 73.4 kg, SpO2 99 %. Physical Exam Vitals reviewed.  Constitutional:      Appearance: Normal appearance.  HENT:     Head: Normocephalic.     Comments: Left temporal artery palpable, right is not palpable, prior scar from brain tumor above right temporal region    Nose: Nose normal.  Eyes:     Extraocular Movements: Extraocular movements intact.  Cardiovascular:     Rate and Rhythm: Normal rate.  Pulmonary:     Effort: Pulmonary effort is normal.  Abdominal:     General: There is no distension.     Palpations: Abdomen is soft.     Tenderness: There is no abdominal tenderness.  Musculoskeletal:  General: Normal range of motion.     Cervical back: Normal range of motion.  Skin:    General: Skin is warm.  Neurological:     General: No focal deficit present.     Mental Status: He is alert and oriented to person, place, and time.  Psychiatric:        Mood and Affect: Mood normal.        Behavior: Behavior normal.        Thought Content: Thought content normal.        Judgment: Judgment normal.     Results: Results for orders placed or performed during the hospital encounter of 02/24/21 (from the past 48 hour(s))  Culture, blood (routine x 2)     Status: None (Preliminary result)   Collection Time: 02/25/21  5:03 PM   Specimen: Right Antecubital; Blood  Result Value Ref Range   Specimen Description RIGHT ANTECUBITAL    Special Requests      Blood Culture adequate volume BOTTLES DRAWN AEROBIC AND ANAEROBIC   Culture      NO GROWTH 2 DAYS Performed at [facility], [address] [place], [place] [address]    Report Status PENDING   Culture, blood (routine x 2)     Status: None (Preliminary result)   Collection Time: 02/25/21  5:03 PM   Specimen: BLOOD RIGHT ARM  Result Value Ref Range   Specimen Description BLOOD RIGHT ARM    Special Requests      Blood Culture results may not be optimal due to an  inadequate volume of blood received in culture bottles BOTTLES DRAWN AEROBIC ONLY   Culture      NO GROWTH 2 DAYS Performed at [facility], [address], [place], [place] [address]    Report Status PENDING   Procalcitonin     Status: None   Collection Time: 02/26/21  6:29 AM  Result Value Ref Range   Procalcitonin 0.80 ng/mL    Comment:        Interpretation: PCT > 0.5 ng/mL and <= 2 ng/mL: Systemic infection (sepsis) is possible, but other conditions are known to elevate PCT as well. (NOTE)       Sepsis PCT Algorithm           Lower Respiratory Tract                                      Infection PCT Algorithm    ----------------------------     ----------------------------         PCT < 0.25 ng/mL                PCT < 0.10 ng/mL          Strongly encourage             Strongly discourage   discontinuation of antibiotics    initiation of antibiotics    ----------------------------     -----------------------------       PCT 0.25 - 0.50 ng/mL            PCT 0.10 - 0.25 ng/mL               OR       >80% decrease in PCT            Discourage initiation of  antibiotics      Encourage discontinuation           of antibiotics    ----------------------------     -----------------------------         PCT >= 0.50 ng/mL              PCT 0.26 - 0.50 ng/mL                AND       <80% decrease in PCT             Encourage initiation of                                             antibiotics       Encourage continuation           of antibiotics    ----------------------------     -----------------------------        PCT >= 0.50 ng/mL                  PCT > 0.50 ng/mL               AND         increase in PCT                  Strongly encourage                                      initiation of antibiotics    Strongly encourage escalation           of antibiotics                                     -----------------------------                                            PCT <= 0.25 ng/mL                                                 OR                                        > 80% decrease in PCT                                      Discontinue / Do not initiate                                             antibiotics  Performed at [facility], [address] [place], [place] [address]   CBC with Differential/Platelet     Status: Abnormal   Collection Time:  02/26/21  6:29 AM  Result Value Ref Range   WBC 16.9 (H) 4.0 - 10.5 K/uL   RBC 3.48 (L) 4.22 - 5.81 MIL/uL   Hemoglobin 10.1 (L) 13.0 - 17.0 g/dL   HCT 31.6 (L) 39.0 - 52.0 %   MCV 90.8 80.0 - 100.0 fL   MCH 29.0 26.0 - 34.0 pg   MCHC 32.0 30.0 - 36.0 g/dL   RDW 13.2 11.5 - 15.5 %   Platelets 551 (H) 150 - 400 K/uL   nRBC 0.0 0.0 - 0.2 %   Neutrophils Relative % 88 %   Neutro Abs 15.0 (H) 1.7 - 7.7 K/uL   Lymphocytes Relative 6 %   Lymphs Abs 1.0 0.7 - 4.0 K/uL   Monocytes Relative 5 %   Monocytes Absolute 0.8 0.1 - 1.0 K/uL   Eosinophils Relative 0 %   Eosinophils Absolute 0.0 0.0 - 0.5 K/uL   Basophils Relative 0 %   Basophils Absolute 0.0 0.0 - 0.1 K/uL   Immature Granulocytes 1 %   Abs Immature Granulocytes 0.14 (H) 0.00 - 0.07 K/uL    Comment: Performed at [facility], [address], [place], [place] [address]  Comprehensive metabolic panel     Status: Abnormal   Collection Time: 02/26/21  6:29 AM  Result Value Ref Range   Sodium 139 135 - 145 mmol/L   Potassium 4.1 3.5 - 5.1 mmol/L    Comment: DELTA CHECK NOTED   Chloride 108 98 - 111 mmol/L   CO2 22 22 - 32 mmol/L   Glucose, Bld 140 (H) 70 - 99 mg/dL    Comment: Glucose reference range applies only to samples taken after fasting for at least 8 hours.   BUN 31 (H) 8 - 23 mg/dL   Creatinine, Ser 1.29 (H) 0.61 - 1.24 mg/dL   Calcium 8.3 (L) 8.9 - 10.3 mg/dL   Total Protein 6.2 (L) 6.5 - 8.1 g/dL   Albumin 1.9 (L) 3.5 - 5.0 g/dL   AST 63 (H) 15 - 41 U/L   ALT 129 (H) 0 - 44 U/L   Alkaline  Phosphatase 245 (H) 38 - 126 U/L   Total Bilirubin 0.6 0.3 - 1.2 mg/dL   GFR, Estimated 59 (L) >60 mL/min    Comment: (NOTE) Calculated using the CKD-EPI Creatinine Equation (2021)    Anion gap 9 5 - 15    Comment: Performed at [facility], [address] [place], [place] [address]  Magnesium     Status: Abnormal   Collection Time: 02/26/21  6:29 AM  Result Value Ref Range   Magnesium 2.5 (H) 1.7 - 2.4 mg/dL    Comment: Performed at [facility], [address], [place], [place] [address]  Phosphorus     Status: None   Collection Time: 02/26/21  6:29 AM  Result Value Ref Range   Phosphorus 4.0 2.5 - 4.6 mg/dL    Comment: Performed at [facility], [address], [place], [place] [address]  Ferritin     Status: Abnormal   Collection Time: 02/26/21  6:29 AM  Result Value Ref Range   Ferritin 1,128 (H) 24 - 336 ng/mL    Comment: Performed at [facility], [address], [place], [place] [address]  Iron and TIBC     Status: Abnormal   Collection Time: 02/26/21  6:29 AM  Result Value Ref Range   Iron 17 (L) 45 - 182 ug/dL   TIBC 125 (L) 250 - 450 ug/dL  Saturation Ratios 14 (L) 17.9 - 39.5 %   UIBC 108 ug/dL    Comment: Performed at [facility], [address], [place], [place] [address]  Glucose, capillary     Status: Abnormal   Collection Time: 02/26/21 10:17 PM  Result Value Ref Range   Glucose-Capillary 193 (H) 70 - 99 mg/dL    Comment: Glucose reference range applies only to samples taken after fasting for at least 8 hours.  Procalcitonin     Status: None   Collection Time: 02/27/21  6:07 AM  Result Value Ref Range   Procalcitonin 0.51 ng/mL    Comment:        Interpretation: PCT > 0.5 ng/mL and <= 2 ng/mL: Systemic infection (sepsis) is possible, but other conditions are known to elevate PCT as well. (NOTE)       Sepsis PCT Algorithm           Lower Respiratory Tract                                      Infection PCT Algorithm    ----------------------------      ----------------------------         PCT < 0.25 ng/mL                PCT < 0.10 ng/mL          Strongly encourage             Strongly discourage   discontinuation of antibiotics    initiation of antibiotics    ----------------------------     -----------------------------       PCT 0.25 - 0.50 ng/mL            PCT 0.10 - 0.25 ng/mL               OR       >80% decrease in PCT            Discourage initiation of                                            antibiotics      Encourage discontinuation           of antibiotics    ----------------------------     -----------------------------         PCT >= 0.50 ng/mL              PCT 0.26 - 0.50 ng/mL                AND       <80% decrease in PCT             Encourage initiation of                                             antibiotics       Encourage continuation           of antibiotics    ----------------------------     -----------------------------        PCT >= 0.50 ng/mL                  PCT >  0.50 ng/mL               AND         increase in PCT                  Strongly encourage                                      initiation of antibiotics    Strongly encourage escalation           of antibiotics                                     -----------------------------                                           PCT <= 0.25 ng/mL                                                 OR                                        > 80% decrease in PCT                                      Discontinue / Do not initiate                                             antibiotics  Performed at [facility], [address] [place], [place] [address]   Comprehensive metabolic panel     Status: Abnormal   Collection Time: 02/27/21  6:07 AM  Result Value Ref Range   Sodium 141 135 - 145 mmol/L   Potassium 4.5 3.5 - 5.1 mmol/L   Chloride 106 98 - 111 mmol/L   CO2 27 22 - 32 mmol/L   Glucose, Bld 133 (H) 70 - 99 mg/dL    Comment: Glucose reference range  applies only to samples taken after fasting for at least 8 hours.   BUN 33 (H) 8 - 23 mg/dL   Creatinine, Ser 1.18 0.61 - 1.24 mg/dL   Calcium 8.4 (L) 8.9 - 10.3 mg/dL   Total Protein 6.7 6.5 - 8.1 g/dL   Albumin 2.1 (L) 3.5 - 5.0 g/dL   AST 69 (H) 15 - 41 U/L   ALT 145 (H) 0 - 44 U/L   Alkaline Phosphatase 258 (H) 38 - 126 U/L   Total Bilirubin 0.7 0.3 - 1.2 mg/dL   GFR, Estimated >60 >60 mL/min    Comment: (NOTE) Calculated using the CKD-EPI Creatinine Equation (2021)    Anion gap 8 5 - 15    Comment: Performed at [facility], [address] [place], [place] [address]  CBC     Status: Abnormal  Collection Time: 02/27/21  6:07 AM  Result Value Ref Range   WBC 19.7 (H) 4.0 - 10.5 K/uL   RBC 3.81 (L) 4.22 - 5.81 MIL/uL   Hemoglobin 10.9 (L) 13.0 - 17.0 g/dL   HCT 34.8 (L) 39.0 - 52.0 %   MCV 91.3 80.0 - 100.0 fL   MCH 28.6 26.0 - 34.0 pg   MCHC 31.3 30.0 - 36.0 g/dL   RDW 13.3 11.5 - 15.5 %   Platelets 634 (H) 150 - 400 K/uL   nRBC 0.0 0.0 - 0.2 %    Comment: Performed at [facility], [address] [place], [place] [address]    US Abdomen Limited RUQ (LIVER/GB)  Result Date: 02/26/2021 CLINICAL DATA:  Elevated LFTs EXAM: ULTRASOUND ABDOMEN LIMITED RIGHT UPPER QUADRANT COMPARISON:  None. FINDINGS: Gallbladder: No gallstones or wall thickening visualized. No sonographic Murphy sign noted by sonographer. Common bile duct: Diameter: Normal at 6 mm Liver: No focal lesion identified. Within normal limits in parenchymal echogenicity. Portal vein is patent on color Doppler imaging with normal direction of blood flow towards the liver. Other: None. IMPRESSION: Unremarkable right upper quadrant ultrasound. Electronically Signed   By: Jacqulynn Cadet M.D.   On: 02/26/2021 13:40     Assessment & Plan:  Deyon Chizek is a 75 y.o. male with vasculitis of the large vessels and request for a temporal artery biopsy. We discussed plan on the left given that I cannot palpate anything on the  right. Discussed risk of bleeding, infection, numbness/ nerve damage, risk of non diagnostic findings.   -Will update his daughter -NPO midnight, Consent orders placed, he is oriented All questions were answered to the satisfaction of the patient. -If he is transferred to [facility] we will cancel the case, this should not prevent his transfer or delay his transfer.     Virl Cagey 02/27/2021, 3:51 PM

## 2021-02-27 NOTE — Progress Notes (Addendum)
PROGRESS NOTE  Curtis Marquez PHX:[id number] DOB: 1945/11/20 DOA: 02/24/2021 PCP: Monico Blitz, MD  Brief History:  Curtis Marquez a 75 y.o.malewith medical history significant forhypertension, glaucoma, s/p brain tumor excision (1994), prostate cancer s/p robotic assisted laparoscopic radical prostatectomy (January 2015) who presents to the emergency department via EMS due to generalized weakness, fever and confusion. Per report, patient has had progressive weakness and confusion with poor appetite since last visit to the ED (5/2). 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  He was started on steroid eye drops.  However, he continued to have eye pain and decreased vision.  He represented with fatigue and general malaise, fevers and eyepain on 02/24/21. Imaging is notable for enteritis, aortitis, and possible vasculitis. Admitted for further workup.  He was started on empiric antibiotics and IV steroids.  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--GCA, Takayasu's, IgG4 or Behcet's -RPR negative -continue empiric zosyn for now -Recurrent fevers, leukocytosis -> meets criteria for SIRS -CT with prominent wall thickening involving distal small bowel in the pelvis with associated inflammatory changes - c/w enteritis of infectious, inflammatory or ischemic etiology - also with abnormal appearance of aortic arch and proximal descending thoracic aorta which demonstrates indistinct mural thickening and surrounding inflammatory change, suspicious for aortitis/vasculitis  -ESR 120, CRP 21.8 -Procal 0.81 -Follow autoimmune labs - ANCA titers, MPO/PR3,  anti CCP, dsDNA, Anti-Sm,  SS-A, SS-B -check IgG4 -ANA--NEG -C3--200 and C4 27 -RF--23.9 -Quant gold -acute viral hepatitis panel--neg -Discussed with vascular, recommended discussion with ID or rheum.  -No rheum in house, discussed with [facility] rheum. Discussed overall plan. Noted if concern for GCA, consider steroids, 1 mg/kg.  -Blood cultures--neg to date -Discussed case with ID recommended repeating blood cultures. Consider d/c abx if cx negative at 48 hrs. Carotid dopplers without evidence of vasculitis (moderate bilateral atherosclerotic plaque, no hemodynamically significant stenosis) -Of note, discussed with his ophthalmology office who note his exam was c/w uveitis (increased IOP thought 2/2 to this). His presentation with eye pain, vision loss, concerning for GCA in this presentation, though uveitis doesn't appear to be common finding with this. No jaw claudication, discomfort with palpation.  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 Peter Minium at [facility]-->patient is now on wait list for transfer to [facility]  Generalized Weakness  Altered Mental Status -No focal neuro deficits.  -remains A&Ox~2 today when I spoke to him. -Suspect this is 2/2 above.  -Head CT with chronic small vessel ischemic changes. -5/8--seems a little better, but clearly not at baseline  Elevated LFT's -acute hepatitis panel  -RUQ ultrasound--neg -suspect a degree of autoimmune hepatitis vs autoimmune cholangitis -coags unremarkable -largely stable -continue to trend  Acute Kidney Injury -Baseline 0.8-1.0 -1.61 at presentation, improving, continue IVF>>1.18  Glaucoma  Uveitis Continue eye drops      Status is: Inpatient  Remains inpatient appropriate because:Inpatient level of care appropriate due to severity of illness   Dispo: The patient is from: Home  Anticipated  d/c is to: Home  Patient currently is not medically stable to d/c.              Difficult to place patient  No        Family Communication:  Daughter updated 5/8  Consultants:  General surgery; GI  Code Status:  FULL   DVT Prophylaxis:  [place] Heparin    Procedures: As Listed in Progress Note Above  Antibiotics: Zosyn 5/6>>      Subjective: Patient states eye pain is gradually improving.  He states his vision is also gradually improving.  Denies f/c, cp, sob, n/v/d, abd pain, headache, neck pain  Objective: Vitals:   02/26/21 1422 02/26/21 2215 02/27/21 0617 02/27/21 0739  BP: 125/71 (!) 113/56 (!) 148/67 (!) 144/72  Pulse: 72 (!) 57 (!) 51 (!) 54  Resp: 18 17 18 16   Temp: 98.1 F (36.7 C) 98.1 F (36.7 C) 98.6 F (37 C) 98.8 F (37.1 C)  TempSrc: Oral   Oral  SpO2: 100% 98% 96% 99%  Weight:      Height:        Intake/Output Summary (Last 24 hours) at 02/27/2021 1054 Last data filed at 02/27/2021 0600 Gross per 24 hour  Intake 153.32 ml  Output 400 ml  Net -246.68 ml   Weight change:  Exam:   General:  Pt is alert, follows commands appropriately, not in acute distress  HEENT: No icterus, No thrush, No neck mass, [place]/AT; no meningismus  Cardiovascular: RRR, S1/S2, no rubs, no gallops  Respiratory: CTA bilaterally, no wheezing, no crackles, no rhonchi  Abdomen: Soft/+BS, non tender, non distended, no guarding  Extremities: No edema, No lymphangitis, No petechiae, No rashes, no synovitis   Data Reviewed: I have personally reviewed following labs and imaging studies Basic Metabolic Panel: Recent Labs  Lab 02/21/21 1920 02/24/21 1745 02/25/21 0156 02/25/21 0445 02/26/21 0629 02/27/21 0607  NA 139 139  --  138 139 141  K 3.3* 3.6  --  3.2* 4.1 4.5  CL 101 106  --  107 108 106  CO2 26 23  --  22 22 27   GLUCOSE 125* 110*  --  125* 140* 133*  BUN 12 35*  --  30* 31* 33*  CREATININE 0.88 1.61* 1.40* 1.34* 1.29* 1.18   CALCIUM 8.5* 8.6*  --  7.9* 8.3* 8.4*  MG  --   --   --  2.4 2.5*  --   PHOS  --   --   --  3.9 4.0  --    Liver Function Tests: Recent Labs  Lab 02/21/21 1920 02/24/21 1745 02/25/21 0445 02/26/21 0629 02/27/21 0607  AST 17 95* 98* 63* 69*  ALT 49* 126* 127* 129* 145*  ALKPHOS 191* 253* 250* 245* 258*  BILITOT 1.0 1.0 1.3* 0.6 0.7  PROT 8.0 7.3 6.2* 6.2* 6.7  ALBUMIN 3.0* 2.4* 2.1* 1.9* 2.1*   Recent Labs  Lab 02/21/21 1920  LIPASE 23   No results for input(s): AMMONIA in the last 168 hours. Coagulation Profile: Recent Labs  Lab 02/24/21 1745 02/25/21 0445  INR 1.2 1.3*   CBC: Recent Labs  Lab 02/21/21 1920 02/24/21 1745 02/25/21 0156 02/25/21 0445 02/26/21 0629 02/27/21 0607  WBC 16.7* 15.7* 17.2* 17.3* 16.9* 19.7*  NEUTROABS 14.1* 13.2*  --   --  15.0*  --   HGB 12.9* 12.1* 10.4* 10.2* 10.1* 10.9*  HCT 39.9 37.7* 32.6* 32.0* 31.6* 34.8*  MCV 90.1 90.6 90.6 90.4 90.8 91.3  PLT 604* 641* 509* 494* 551* 634*   Cardiac Enzymes: No results for input(s): CKTOTAL, CKMB, CKMBINDEX, TROPONINI in the last 168 hours. BNP: Invalid input(s): POCBNP CBG: Recent Labs  Lab 02/26/21 2217  GLUCAP 193*   HbA1C: No results for input(s): HGBA1C in the last 72 hours. Urine analysis:    Component Value Date/Time   COLORURINE YELLOW 02/25/2021 0118   APPEARANCEUR CLEAR 02/25/2021 0118   LABSPEC >1.046 (H) 02/25/2021 0118   PHURINE 5.0 02/25/2021 0118   GLUCOSEU NEGATIVE 02/25/2021 0118   HGBUR NEGATIVE 02/25/2021 0118   BILIRUBINUR NEGATIVE 02/25/2021 0118   KETONESUR 5 (A) 02/25/2021 0118   PROTEINUR NEGATIVE 02/25/2021 0118   NITRITE NEGATIVE 02/25/2021 0118   LEUKOCYTESUR NEGATIVE 02/25/2021 0118   Sepsis Labs: @LABRCNTIP (procalcitonin:4,lacticidven:4) ) Recent Results (from the past 240 hour(s))  Culture, blood (routine x 2)     Status: None   Collection Time: 02/21/21  7:30 PM   Specimen: BLOOD  Result Value Ref Range Status   Specimen Description  BLOOD RIGHT ANTECUBITAL  Final   Special Requests   Final    BOTTLES DRAWN AEROBIC AND ANAEROBIC Blood Culture adequate volume   Culture   Final    NO GROWTH 5 DAYS Performed at [facility], [address], [place], [place] [address]    Report Status 02/26/2021 FINAL  Final  Culture, blood (routine x 2)     Status: None   Collection Time: 02/21/21  7:30 PM   Specimen: BLOOD RIGHT FOREARM  Result Value Ref Range Status   Specimen Description BLOOD RIGHT FOREARM  Final   Special Requests   Final    BOTTLES DRAWN AEROBIC AND ANAEROBIC Blood Culture adequate volume   Culture   Final    NO GROWTH 5 DAYS Performed at [facility], [address] [place], [place] [address]    Report Status 02/26/2021 FINAL  Final  Urine culture     Status: Abnormal   Collection Time: 02/21/21  9:49 PM   Specimen: Urine, Clean Catch  Result Value Ref Range Status   Specimen Description   Final    URINE, CLEAN CATCH Performed at [facility], [address], [place], [place] [address]    Special Requests   Final    NONE Performed at [facility], [address] [place], [place] [address]    Culture (A)  Final    10,000 COLONIES/mL AEROCOCCUS URINAE Standardized susceptibility testing for this organism is not available. Performed at [facility], [place] [address], [place], [place] [address]    Report Status 02/25/2021 FINAL  Final  SARS CORONAVIRUS 2 (Jhoselyn Ruffini 6-24 HRS) Nasopharyngeal Nasopharyngeal Swab     Status: None   Collection Time: 02/24/21  5:32 PM   Specimen: Nasopharyngeal Swab  Result Value Ref Range Status   SARS Coronavirus 2 NEGATIVE NEGATIVE Final    Comment: (NOTE) SARS-CoV-2 target nucleic acids are NOT DETECTED.  The SARS-CoV-2 RNA is generally detectable in upper and lower respiratory specimens during the acute phase of infection. Negative results do not preclude SARS-CoV-2 infection, do not rule out co-infections with other pathogens, and should not be used as the sole basis  for treatment or other patient management decisions. Negative results must be combined with clinical observations, patient history, and epidemiological information. The expected result is Negative.  Fact Sheet for Patients: SugarRoll.be  Fact Sheet for Healthcare Providers: https://www.woods-mathews.com/  This test is not yet approved or cleared by the [place] FDA and  has been authorized for detection and/or diagnosis of SARS-CoV-2 by FDA under an Emergency Use Authorization (EUA). This EUA will remain  in effect (meaning this test can be used) for the duration  of the COVID-19 declaration under Se ction 564(b)(1) of the Act, 21 U.S.C. section 360bbb-3(b)(1), unless the authorization is terminated or revoked sooner.  Performed at [facility], [place] [address], [place], [place] [address]   Blood Culture (routine x 2)     Status: None (Preliminary result)   Collection Time: 02/24/21  5:45 PM   Specimen: BLOOD LEFT ARM  Result Value Ref Range Status   Specimen Description BLOOD LEFT ARM  Final   Special Requests   Final    BOTTLES DRAWN AEROBIC AND ANAEROBIC Blood Culture adequate volume   Culture   Final    NO GROWTH 3 DAYS Performed at [facility], [address], [place], [place] [address]    Report Status PENDING  Incomplete  Blood Culture (routine x 2)     Status: None (Preliminary result)   Collection Time: 02/24/21  5:45 PM   Specimen: BLOOD RIGHT ARM  Result Value Ref Range Status   Specimen Description BLOOD RIGHT ARM  Final   Special Requests   Final    BOTTLES DRAWN AEROBIC AND ANAEROBIC Blood Culture adequate volume   Culture   Final    NO GROWTH 3 DAYS Performed at [facility], [address] [place], [place] [address]    Report Status PENDING  Incomplete  Resp Panel by RT-PCR (Flu A&B, Covid) Nasopharyngeal Swab     Status: None   Collection Time: 02/24/21  9:06 PM   Specimen: Nasopharyngeal Swab;  Nasopharyngeal(NP) swabs in vial transport medium  Result Value Ref Range Status   SARS Coronavirus 2 by RT PCR NEGATIVE NEGATIVE Final    Comment: (NOTE) SARS-CoV-2 target nucleic acids are NOT DETECTED.  The SARS-CoV-2 RNA is generally detectable in upper respiratory specimens during the acute phase of infection. The lowest concentration of SARS-CoV-2 viral copies this assay can detect is 138 copies/mL. A negative result does not preclude SARS-Cov-2 infection and should not be used as the sole basis for treatment or other patient management decisions. A negative result may occur with  improper specimen collection/handling, submission of specimen other than nasopharyngeal swab, presence of viral mutation(s) within the areas targeted by this assay, and inadequate number of viral copies(<138 copies/mL). A negative result must be combined with clinical observations, patient history, and epidemiological information. The expected result is Negative.  Fact Sheet for Patients:  EntrepreneurPulse.com.au  Fact Sheet for Healthcare Providers:  IncredibleEmployment.be  This test is no t yet approved or cleared by the [place] FDA and  has been authorized for detection and/or diagnosis of SARS-CoV-2 by FDA under an Emergency Use Authorization (EUA). This EUA will remain  in effect (meaning this test can be used) for the duration of the COVID-19 declaration under Section 564(b)(1) of the Act, 21 U.S.C.section 360bbb-3(b)(1), unless the authorization is terminated  or revoked sooner.       Influenza A by PCR NEGATIVE NEGATIVE Final   Influenza B by PCR NEGATIVE NEGATIVE Final    Comment: (NOTE) The Xpert Xpress SARS-CoV-2/FLU/RSV plus assay is intended as an aid in the diagnosis of influenza from Nasopharyngeal swab specimens and should not be used as a sole basis for treatment. Nasal washings and aspirates are unacceptable for Xpert Xpress  SARS-CoV-2/FLU/RSV testing.  Fact Sheet for Patients: EntrepreneurPulse.com.au  Fact Sheet for Healthcare Providers: IncredibleEmployment.be  This test is not yet approved or cleared by the [place] FDA and has been authorized for detection and/or diagnosis of SARS-CoV-2 by FDA under an Emergency Use  Authorization (EUA). This EUA will remain in effect (meaning this test can be used) for the duration of the COVID-19 declaration under Section 564(b)(1) of the Act, 21 U.S.C. section 360bbb-3(b)(1), unless the authorization is terminated or revoked.  Performed at [facility], [address] [place], [place] [address]   Urine culture     Status: None   Collection Time: 02/25/21  1:18 AM   Specimen: In/Out Cath Urine  Result Value Ref Range Status   Specimen Description   Final    IN/OUT CATH URINE Performed at [facility], [address],  [place], [place] [address]    Special Requests   Final    NONE Performed at [facility], [address], [place], [place] [address]    Culture   Final    NO GROWTH Performed at [facility], [place] [address] [place], [place] [address]    Report Status 02/26/2021 FINAL  Final  Culture, blood (routine x 2)     Status: None (Preliminary result)   Collection Time: 02/25/21  5:03 PM   Specimen: Right Antecubital; Blood  Result Value Ref Range Status   Specimen Description RIGHT ANTECUBITAL  Final   Special Requests   Final    Blood Culture adequate volume BOTTLES DRAWN AEROBIC AND ANAEROBIC   Culture   Final    NO GROWTH 2 DAYS Performed at [facility], [address] [place], [place] [address]    Report Status PENDING  Incomplete  Culture, blood (routine x 2)     Status: None (Preliminary result)   Collection Time: 02/25/21  5:03 PM   Specimen: BLOOD RIGHT ARM  Result Value Ref Range Status   Specimen Description BLOOD RIGHT ARM  Final   Special Requests   Final    Blood Culture results may  not be optimal due to an inadequate volume of blood received in culture bottles BOTTLES DRAWN AEROBIC ONLY   Culture   Final    NO GROWTH 2 DAYS Performed at [facility], [address] [place], [place] [address]    Report Status PENDING  Incomplete     Scheduled Meds: . amLODipine  5 mg Oral Daily  . brimonidine  1 drop Left Eye TID  . feeding supplement  237 mL Oral BID BM  . heparin  5,000 Units Subcutaneous Q8H  . methylPREDNISolone (SOLU-MEDROL) injection  80 mg Intravenous Daily  . pantoprazole (PROTONIX) IV  40 mg Intravenous Q24H  . prednisoLONE acetate  1 drop Left Eye QID  . timolol  1 drop Left Eye BID   Continuous Infusions: . sodium chloride 75 mL/hr at 02/26/21 1458  . piperacillin-tazobactam (ZOSYN)  IV 3.375 g (02/27/21 1001)    Procedures/Studies: CT Head Wo Contrast  Result Date: 02/24/2021 CLINICAL DATA:  Mental status change. Weakness, loss of appetite and fever. EXAM: CT HEAD WITHOUT CONTRAST TECHNIQUE: Contiguous axial images were obtained from the base of the skull through the vertex without intravenous contrast. COMPARISON:  02/09/2016 FINDINGS: Brain: No evidence of acute infarction, hemorrhage, hydrocephalus, extra-axial collection or mass lesion/mass effect. There is mild diffuse low-attenuation within the subcortical and periventricular white matter compatible with chronic microvascular disease. Vascular: No hyperdense vessel or unexpected calcification. Skull: Previous right frontal craniotomy. Negative for fracture or focal lesion. Sinuses/Orbits: No acute finding. Other: None. IMPRESSION: 1. No acute intracranial abnormalities. 2. Chronic small vessel ischemic change and brain atrophy. Electronically Signed   By: Kerby Moors M.D.   On: 02/24/2021 18:54  CT CHEST ABDOMEN PELVIS W CONTRAST  Result Date: 02/24/2021 CLINICAL DATA:  Abdomen pain fever and leukocytosis EXAM: CT CHEST, ABDOMEN, AND PELVIS WITH CONTRAST TECHNIQUE: Multidetector CT imaging of  the chest, abdomen and pelvis was performed following the standard protocol during bolus administration of intravenous contrast. CONTRAST:  53m OMNIPAQUE IOHEXOL 300 MG/ML  SOLN COMPARISON:  CT 02/09/2016 FINDINGS: CT CHEST FINDINGS Cardiovascular: Mild aortic atherosclerosis. Indistinct wall thickening at the great vessel origins aortic arch and proximal descending aorta. No dissection is seen. There is no aneurysmal formation. Mild aortic atherosclerosis. Cardiomegaly. Common origin of the left common carotid artery and brachiocephalic artery. Cardiomegaly. Trace pericardial effusion. Mediastinum/Nodes: Midline trachea. No thyroid mass. No suspicious adenopathy. Esophagus within normal limits Lungs/Pleura: No consolidation or pneumothorax. No significant pleural effusion Musculoskeletal: No acute or suspicious osseous abnormality. CT ABDOMEN PELVIS FINDINGS Hepatobiliary: Subcentimeter hypodensity within the anterior liver too small to further characterize. No calcified gallstone or biliary dilatation. Pancreas: Unremarkable. No pancreatic ductal dilatation or surrounding inflammatory changes. Spleen: Normal in size without focal abnormality. Adrenals/Urinary Tract: Adrenal glands within normal limits. Kidneys show no hydronephrosis. Cyst in the right kidney. The bladder is unremarkable Stomach/Bowel: The stomach is nonenlarged. Negative appendix. Wall thickening and inflammatory change involving distal small bowel in the pelvis concerning for enteritis. Vascular/Lymphatic: Nonaneurysmal aorta. Mild aortic atherosclerosis. Possible mild mural thickening of the abdominal aorta, external iliac vessels and common femoral vessels. No suspicious nodes. Reproductive: Status post prostatectomy. Other: Small free fluid in the pelvis.  No free air. Musculoskeletal: No acute or suspicious osseous abnormality IMPRESSION: 1. Prominent wall thickening involving distal small bowel in the pelvis with associated inflammatory  changes, consistent with enteritis of infectious, inflammatory, or ischemic etiology. 2. Abnormal appearance of the aortic arch and proximal descending thoracic aorta which demonstrates indistinct mural thickening and surrounding inflammatory change, suspicious for aortitis/vasculitis. This could be secondary to infectious etiologies as well as autoimmune disease. There is no aneurysmal dilatation at this time. 3. Small free fluid in the pelvis Electronically Signed   By: KDonavan FoilM.D.   On: 02/24/2021 23:31   [place]Carotid Bilateral  Result Date: 02/25/2021 CLINICAL DATA:  Vasculitis questioned on previous CT. History of hypertension and hyperlipidemia. Former smoker. EXAM: BILATERAL CAROTID DUPLEX ULTRASOUND TECHNIQUE: GPearline Cablesscale imaging, color Doppler and duplex ultrasound were performed of bilateral carotid and vertebral arteries in the neck. COMPARISON:  Carotid Doppler ultrasound-02/10/2016; CT the chest, abdomen and pelvis-02/24/2021 FINDINGS: Criteria: Quantification of carotid stenosis is based on velocity parameters that correlate the residual internal carotid diameter with NASCET-based stenosis levels, using the diameter of the distal internal carotid lumen as the denominator for stenosis measurement. The following velocity measurements were obtained: RIGHT ICA: 80/19 cm/sec CCA: 1160/10cm/sec SYSTOLIC ICA/CCA RATIO:  0.6 ECA: 104 cm/sec LEFT ICA: 91/24 cm/sec CCA: 993/23cm/sec SYSTOLIC ICA/CCA RATIO:  0.9 ECA: 110 cm/sec RIGHT CAROTID ARTERY: There is mild tortuosity involving the right common carotid artery (images 4 and 8). There is a moderate amount of eccentric mixed echogenic plaque within the right carotid bulb (image 14 and 16), extending to involve the origin and proximal aspects of the right internal carotid artery (image 24), progressed compared to the 2017 examination though not resulting in elevated peak systolic velocities within the interrogated course of the right internal carotid  artery to suggest a hemodynamically significant stenosis. No discrete areas of vessel irregularity or abnormal wall thickening. RIGHT VERTEBRAL ARTERY:  Antegrade flow LEFT CAROTID ARTERY: There is a moderate amount of  eccentric hypoechoic plaque involving the distal aspect the left common carotid artery (image 44 and 45). There is a moderate amount of eccentric echogenic partially shadowing plaque within the left carotid bulb (image 50), extending to involve the origin and proximal aspects of the left internal carotid artery (image 58), unchanged to slightly progressed compared to the 2017 examination though not resulting in elevated peak systolic velocities within the interrogated course of the left internal carotid artery to suggest a hemodynamically significant stenosis. No discrete areas of vessel irregularity or thickening. LEFT VERTEBRAL ARTERY:  Antegrade flow IMPRESSION: 1. Moderate amount of bilateral atherosclerotic plaque, unchanged to slightly progressed compared to remote examination performed in 2017, though not resulting in a hemodynamically significant stenosis within either internal carotid artery. 2. No sonographic evidence of vessel wall irregularity or thickening to suggest a vasculitis affecting the carotid arteries. Further evaluation with CTA could be performed as indicated. Electronically Signed   By: Sandi Mariscal M.D.   On: 02/25/2021 16:03   DG Chest Port 1 View  Result Date: 02/24/2021 CLINICAL DATA:  Sepsis EXAM: PORTABLE CHEST 1 VIEW COMPARISON:  11/28/2018 FINDINGS: The heart size and mediastinal contours are within normal limits. Both lungs are clear. The visualized skeletal structures are unremarkable. IMPRESSION: No active disease. Electronically Signed   By: Fidela Salisbury MD   On: 02/24/2021 18:04   US Abdomen Limited RUQ (LIVER/GB)  Result Date: 02/26/2021 CLINICAL DATA:  Elevated LFTs EXAM: ULTRASOUND ABDOMEN LIMITED RIGHT UPPER QUADRANT COMPARISON:  None. FINDINGS:  Gallbladder: No gallstones or wall thickening visualized. No sonographic Murphy sign noted by sonographer. Common bile duct: Diameter: Normal at 6 mm Liver: No focal lesion identified. Within normal limits in parenchymal echogenicity. Portal vein is patent on color Doppler imaging with normal direction of blood flow towards the liver. Other: None. IMPRESSION: Unremarkable right upper quadrant ultrasound. Electronically Signed   By: Jacqulynn Cadet M.D.   On: 02/26/2021 13:40    Orson Eva, DO  [facility]  If 7PM-7AM, please contact night-coverage [email or web address] Password [email or web address] 02/27/2021, 10:54 AM   LOS: 2 days

## 2021-02-27 NOTE — Consult Note (Signed)
[facility] Consult  Reason for Consult:  Temporal artery biopsy  Referring Physician:  Dr. Carles Collet   Chief Complaint    Altered Mental Status      HPI: Curtis Marquez is a 75 y.o. male with HTN, glaucoma, brain tumor s/p resection, prostate cancer s/p resection, who came to the hospital with weakness, fever, and confusion in association with eye/ vision complaints consistent with uveitis.  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 He is being worked up for large vessel vasculitis causes like Giant cell arteritis, Takayasu's or Behcet's or IgG4.  He is also on the waitlist for transfer to [facility].  He currently has no complaints. He wants to go home.  He is on antibiotics and steroids currently.  Past Medical History:  Diagnosis Date  . Brain tumor ([place])   . GERD (gastroesophageal reflux disease)   . Hypercholesteremia   . Hypertension   . Prostate cancer ([place]) 11/13/13    Past Surgical History:  Procedure Laterality Date  . BRAIN TUMOR EXCISION  1994  . ESOPHAGOGASTRODUODENOSCOPY N/A 03/18/2019   Procedure: ESOPHAGOGASTRODUODENOSCOPY (EGD);  Surgeon: Danie Binder, MD;  Location: [facility];  Service: Endoscopy;  Laterality: N/A;  . ESOPHAGOGASTRODUODENOSCOPY N/A 03/24/2019   Procedure: ESOPHAGOGASTRODUODENOSCOPY (EGD);  Surgeon: Danie Binder, MD;  Location: [facility];  Service: Endoscopy;  Laterality: N/A;  8:30AM - pt had Covid test done in ED & has not been anywhere since, office spoke with him and he understands to remain quarantined  . LYMPHADENECTOMY Bilateral 11/13/2013   Procedure: LYMPHADENECTOMY "PELVIC LYMPH NODE DISSECTION";  Surgeon: Dutch Gray, MD;  Location: [facility];  Service: Urology;  Laterality: Bilateral;  . ROBOT ASSISTED LAPAROSCOPIC RADICAL PROSTATECTOMY N/A 11/13/2013   Procedure: ROBOTIC ASSISTED LAPAROSCOPIC RADICAL PROSTATECTOMY LEVEL 2;  Surgeon: Dutch Gray, MD;  Location: [facility];  Service: Urology;  Laterality: N/A;   . SAVORY DILATION N/A 03/24/2019   Procedure: SAVORY DILATION;  Surgeon: Danie Binder, MD;  Location: [facility];  Service: Endoscopy;  Laterality: N/A;    Family History  Problem Relation Age of Onset  . CVA Father   . Diabetes Mother   . Hypertension Mother   . Diabetes Sister   . Hypertension Sister   . Cancer Brother     Social History   Tobacco Use  . Smoking status: Former Research scientist (life sciences)  . Smokeless tobacco: Never Used  Vaping Use  . Vaping Use: Never used  Substance Use Topics  . Alcohol use: No    Alcohol/week: 0.0 standard drinks  . Drug use: No    Medications:  I have reviewed the patient's current medications. Prior to Admission:  Medications Prior to Admission  Medication Sig Dispense Refill Last Dose  . amLODipine (NORVASC) 5 MG tablet Take 1 tablet by mouth daily.   02/23/2021 at Unknown time  . brimonidine (ALPHAGAN) 0.2 % ophthalmic solution Place 1 drop into the left eye in the morning and at bedtime.   02/24/2021 at Unknown time  . cloNIDine (CATAPRES) 0.3 MG tablet Take 0.3 mg by mouth 2 (two) times daily.   02/23/2021 at Unknown time  . levocetirizine (XYZAL) 5 MG tablet Take 5 mg by mouth every evening.   Past Month at Unknown time  . olmesartan (BENICAR) 40 MG tablet Take 40 mg by mouth daily.     . prednisoLONE acetate (PRED FORTE) 1 % ophthalmic suspension Place 1 drop into the left eye 4 (four) times daily.  02/24/2021 at Unknown time  . timolol (BETIMOL) 0.5 % ophthalmic solution Place 1 drop into the left eye 2 (two) times daily.   02/24/2021 at Unknown time  . acetaZOLAMIDE (DIAMOX) 250 MG tablet Take 2 tablets (500 mg total) by mouth 2 (two) times daily for 2 days. (Patient not taking: Reported on 02/24/2021) 8 tablet 0 Not Taking at Unknown time  . amoxicillin (AMOXIL) 500 MG tablet 2 PO BID FOR 10 DAYS (Patient not taking: Reported on 02/24/2021) 40 tablet 0 Not Taking at Unknown time  . buPROPion (WELLBUTRIN SR) 100 MG 12 hr tablet Take 100 mg by mouth  daily. (Patient not taking: Reported on 02/24/2021)   Not Taking at Unknown time  . clarithromycin (BIAXIN) 500 MG tablet 1 PO BID FOR 10 DAYS. (Patient not taking: Reported on 02/24/2021) 20 tablet 0 Not Taking at Unknown time  . clonazePAM (KLONOPIN) 0.5 MG tablet Take 0.5 mg by mouth daily as needed for anxiety.  (Patient not taking: Reported on 02/24/2021)   Not Taking at Unknown time  . cloNIDine (CATAPRES) 0.1 MG tablet Take 0.3 mg by mouth 2 (two) times daily. (Patient not taking: Reported on 02/24/2021)   Not Taking at Unknown time  . hydrALAZINE (APRESOLINE) 50 MG tablet Take 50 mg by mouth 2 (two) times a day.  (Patient not taking: Reported on 02/24/2021)   Not Taking at Unknown time  . losartan (COZAAR) 100 MG tablet Take 100 mg by mouth daily.  (Patient not taking: Reported on 02/24/2021)   Not Taking at Unknown time  . meloxicam (MOBIC) 15 MG tablet Take 1 tablet by mouth daily. (Patient not taking: No sig reported)   Not Taking at Unknown time  . omeprazole (PRILOSEC) 20 MG capsule 1 PO 30 mins prior to breakfast and supper (Patient not taking: Reported on 02/24/2021) 60 capsule 11 Not Taking at Unknown time   Scheduled: . amLODipine  5 mg Oral Daily  . brimonidine  1 drop Left Eye TID  . feeding supplement  237 mL Oral BID BM  . heparin  5,000 Units Subcutaneous Q8H  . methylPREDNISolone (SOLU-MEDROL) injection  80 mg Intravenous Daily  . pantoprazole (PROTONIX) IV  40 mg Intravenous Q24H  . prednisoLONE acetate  1 drop Left Eye QID  . timolol  1 drop Left Eye BID   Continuous: . sodium chloride 75 mL/hr at 02/26/21 1458  . piperacillin-tazobactam (ZOSYN)  IV 3.375 g (02/27/21 1001)   MVE:[id number], ondansetron (ZOFRAN) IV  No Known Allergies   ROS:  A comprehensive review of systems was negative except for: Constitutional: positive for weakness Eyes: positive for eye pain and decreased acuity  Blood pressure (!) 144/72, pulse (!) 54, temperature 98.8 F (37.1 C), temperature  source Oral, resp. rate 16, height 5' 8"  (1.727 m), weight 73.4 kg, SpO2 99 %. Physical Exam Vitals reviewed.  Constitutional:      Appearance: Normal appearance.  HENT:     Head: Normocephalic.     Comments: Left temporal artery palpable, right is not palpable, prior scar from brain tumor above right temporal region    Nose: Nose normal.  Eyes:     Extraocular Movements: Extraocular movements intact.  Cardiovascular:     Rate and Rhythm: Normal rate.  Pulmonary:     Effort: Pulmonary effort is normal.  Abdominal:     General: There is no distension.     Palpations: Abdomen is soft.     Tenderness: There is no abdominal tenderness.  Musculoskeletal:  General: Normal range of motion.     Cervical back: Normal range of motion.  Skin:    General: Skin is warm.  Neurological:     General: No focal deficit present.     Mental Status: He is alert and oriented to person, place, and time.  Psychiatric:        Mood and Affect: Mood normal.        Behavior: Behavior normal.        Thought Content: Thought content normal.        Judgment: Judgment normal.     Results: Results for orders placed or performed during the hospital encounter of 02/24/21 (from the past 48 hour(s))  Culture, blood (routine x 2)     Status: None (Preliminary result)   Collection Time: 02/25/21  5:03 PM   Specimen: Right Antecubital; Blood  Result Value Ref Range   Specimen Description RIGHT ANTECUBITAL    Special Requests      Blood Culture adequate volume BOTTLES DRAWN AEROBIC AND ANAEROBIC   Culture      NO GROWTH 2 DAYS Performed at [facility], [address] [place], [place] [address]    Report Status PENDING   Culture, blood (routine x 2)     Status: None (Preliminary result)   Collection Time: 02/25/21  5:03 PM   Specimen: BLOOD RIGHT ARM  Result Value Ref Range   Specimen Description BLOOD RIGHT ARM    Special Requests      Blood Culture results may not be optimal due to an  inadequate volume of blood received in culture bottles BOTTLES DRAWN AEROBIC ONLY   Culture      NO GROWTH 2 DAYS Performed at [facility], [address], [place], [place] [address]    Report Status PENDING   Procalcitonin     Status: None   Collection Time: 02/26/21  6:29 AM  Result Value Ref Range   Procalcitonin 0.80 ng/mL    Comment:        Interpretation: PCT > 0.5 ng/mL and <= 2 ng/mL: Systemic infection (sepsis) is possible, but other conditions are known to elevate PCT as well. (NOTE)       Sepsis PCT Algorithm           Lower Respiratory Tract                                      Infection PCT Algorithm    ----------------------------     ----------------------------         PCT < 0.25 ng/mL                PCT < 0.10 ng/mL          Strongly encourage             Strongly discourage   discontinuation of antibiotics    initiation of antibiotics    ----------------------------     -----------------------------       PCT 0.25 - 0.50 ng/mL            PCT 0.10 - 0.25 ng/mL               OR       >80% decrease in PCT            Discourage initiation of  antibiotics      Encourage discontinuation           of antibiotics    ----------------------------     -----------------------------         PCT >= 0.50 ng/mL              PCT 0.26 - 0.50 ng/mL                AND       <80% decrease in PCT             Encourage initiation of                                             antibiotics       Encourage continuation           of antibiotics    ----------------------------     -----------------------------        PCT >= 0.50 ng/mL                  PCT > 0.50 ng/mL               AND         increase in PCT                  Strongly encourage                                      initiation of antibiotics    Strongly encourage escalation           of antibiotics                                     -----------------------------                                            PCT <= 0.25 ng/mL                                                 OR                                        > 80% decrease in PCT                                      Discontinue / Do not initiate                                             antibiotics  Performed at [facility], [address] [place], [place] [address]   CBC with Differential/Platelet     Status: Abnormal   Collection Time:  02/26/21  6:29 AM  Result Value Ref Range   WBC 16.9 (H) 4.0 - 10.5 K/uL   RBC 3.48 (L) 4.22 - 5.81 MIL/uL   Hemoglobin 10.1 (L) 13.0 - 17.0 g/dL   HCT 31.6 (L) 39.0 - 52.0 %   MCV 90.8 80.0 - 100.0 fL   MCH 29.0 26.0 - 34.0 pg   MCHC 32.0 30.0 - 36.0 g/dL   RDW 13.2 11.5 - 15.5 %   Platelets 551 (H) 150 - 400 K/uL   nRBC 0.0 0.0 - 0.2 %   Neutrophils Relative % 88 %   Neutro Abs 15.0 (H) 1.7 - 7.7 K/uL   Lymphocytes Relative 6 %   Lymphs Abs 1.0 0.7 - 4.0 K/uL   Monocytes Relative 5 %   Monocytes Absolute 0.8 0.1 - 1.0 K/uL   Eosinophils Relative 0 %   Eosinophils Absolute 0.0 0.0 - 0.5 K/uL   Basophils Relative 0 %   Basophils Absolute 0.0 0.0 - 0.1 K/uL   Immature Granulocytes 1 %   Abs Immature Granulocytes 0.14 (H) 0.00 - 0.07 K/uL    Comment: Performed at [facility], [address] [place], [place] [address]  Comprehensive metabolic panel     Status: Abnormal   Collection Time: 02/26/21  6:29 AM  Result Value Ref Range   Sodium 139 135 - 145 mmol/L   Potassium 4.1 3.5 - 5.1 mmol/L    Comment: DELTA CHECK NOTED   Chloride 108 98 - 111 mmol/L   CO2 22 22 - 32 mmol/L   Glucose, Bld 140 (H) 70 - 99 mg/dL    Comment: Glucose reference range applies only to samples taken after fasting for at least 8 hours.   BUN 31 (H) 8 - 23 mg/dL   Creatinine, Ser 1.29 (H) 0.61 - 1.24 mg/dL   Calcium 8.3 (L) 8.9 - 10.3 mg/dL   Total Protein 6.2 (L) 6.5 - 8.1 g/dL   Albumin 1.9 (L) 3.5 - 5.0 g/dL   AST 63 (H) 15 - 41 U/L   ALT 129 (H) 0 - 44 U/L   Alkaline  Phosphatase 245 (H) 38 - 126 U/L   Total Bilirubin 0.6 0.3 - 1.2 mg/dL   GFR, Estimated 59 (L) >60 mL/min    Comment: (NOTE) Calculated using the CKD-EPI Creatinine Equation (2021)    Anion gap 9 5 - 15    Comment: Performed at [facility], [address], [place], [place] [address]  Magnesium     Status: Abnormal   Collection Time: 02/26/21  6:29 AM  Result Value Ref Range   Magnesium 2.5 (H) 1.7 - 2.4 mg/dL    Comment: Performed at [facility], [address], [place], [place] [address]  Phosphorus     Status: None   Collection Time: 02/26/21  6:29 AM  Result Value Ref Range   Phosphorus 4.0 2.5 - 4.6 mg/dL    Comment: Performed at [facility], [address], [place], [place] [address]  Ferritin     Status: Abnormal   Collection Time: 02/26/21  6:29 AM  Result Value Ref Range   Ferritin 1,128 (H) 24 - 336 ng/mL    Comment: Performed at [facility], [address], [place], [place] [address]  Iron and TIBC     Status: Abnormal   Collection Time: 02/26/21  6:29 AM  Result Value Ref Range   Iron 17 (L) 45 - 182 ug/dL   TIBC 125 (L) 250 - 450 ug/dL  Saturation Ratios 14 (L) 17.9 - 39.5 %   UIBC 108 ug/dL    Comment: Performed at [facility], [address], [place], [place] [address]  Glucose, capillary     Status: Abnormal   Collection Time: 02/26/21 10:17 PM  Result Value Ref Range   Glucose-Capillary 193 (H) 70 - 99 mg/dL    Comment: Glucose reference range applies only to samples taken after fasting for at least 8 hours.  Procalcitonin     Status: None   Collection Time: 02/27/21  6:07 AM  Result Value Ref Range   Procalcitonin 0.51 ng/mL    Comment:        Interpretation: PCT > 0.5 ng/mL and <= 2 ng/mL: Systemic infection (sepsis) is possible, but other conditions are known to elevate PCT as well. (NOTE)       Sepsis PCT Algorithm           Lower Respiratory Tract                                      Infection PCT Algorithm    ----------------------------      ----------------------------         PCT < 0.25 ng/mL                PCT < 0.10 ng/mL          Strongly encourage             Strongly discourage   discontinuation of antibiotics    initiation of antibiotics    ----------------------------     -----------------------------       PCT 0.25 - 0.50 ng/mL            PCT 0.10 - 0.25 ng/mL               OR       >80% decrease in PCT            Discourage initiation of                                            antibiotics      Encourage discontinuation           of antibiotics    ----------------------------     -----------------------------         PCT >= 0.50 ng/mL              PCT 0.26 - 0.50 ng/mL                AND       <80% decrease in PCT             Encourage initiation of                                             antibiotics       Encourage continuation           of antibiotics    ----------------------------     -----------------------------        PCT >= 0.50 ng/mL                  PCT >  0.50 ng/mL               AND         increase in PCT                  Strongly encourage                                      initiation of antibiotics    Strongly encourage escalation           of antibiotics                                     -----------------------------                                           PCT <= 0.25 ng/mL                                                 OR                                        > 80% decrease in PCT                                      Discontinue / Do not initiate                                             antibiotics  Performed at [facility], [address], [place], [place] [address]   Comprehensive metabolic panel     Status: Abnormal   Collection Time: 02/27/21  6:07 AM  Result Value Ref Range   Sodium 141 135 - 145 mmol/L   Potassium 4.5 3.5 - 5.1 mmol/L   Chloride 106 98 - 111 mmol/L   CO2 27 22 - 32 mmol/L   Glucose, Bld 133 (H) 70 - 99 mg/dL    Comment: Glucose reference range  applies only to samples taken after fasting for at least 8 hours.   BUN 33 (H) 8 - 23 mg/dL   Creatinine, Ser 1.18 0.61 - 1.24 mg/dL   Calcium 8.4 (L) 8.9 - 10.3 mg/dL   Total Protein 6.7 6.5 - 8.1 g/dL   Albumin 2.1 (L) 3.5 - 5.0 g/dL   AST 69 (H) 15 - 41 U/L   ALT 145 (H) 0 - 44 U/L   Alkaline Phosphatase 258 (H) 38 - 126 U/L   Total Bilirubin 0.7 0.3 - 1.2 mg/dL   GFR, Estimated >60 >60 mL/min    Comment: (NOTE) Calculated using the CKD-EPI Creatinine Equation (2021)    Anion gap 8 5 - 15    Comment: Performed at [facility], [address], [place], [place] [address]  CBC     Status: Abnormal  Collection Time: 02/27/21  6:07 AM  Result Value Ref Range   WBC 19.7 (H) 4.0 - 10.5 K/uL   RBC 3.81 (L) 4.22 - 5.81 MIL/uL   Hemoglobin 10.9 (L) 13.0 - 17.0 g/dL   HCT 34.8 (L) 39.0 - 52.0 %   MCV 91.3 80.0 - 100.0 fL   MCH 28.6 26.0 - 34.0 pg   MCHC 31.3 30.0 - 36.0 g/dL   RDW 13.3 11.5 - 15.5 %   Platelets 634 (H) 150 - 400 K/uL   nRBC 0.0 0.0 - 0.2 %    Comment: Performed at [facility], [address], [place], [place] [address]    US Abdomen Limited RUQ (LIVER/GB)  Result Date: 02/26/2021 CLINICAL DATA:  Elevated LFTs EXAM: ULTRASOUND ABDOMEN LIMITED RIGHT UPPER QUADRANT COMPARISON:  None. FINDINGS: Gallbladder: No gallstones or wall thickening visualized. No sonographic Murphy sign noted by sonographer. Common bile duct: Diameter: Normal at 6 mm Liver: No focal lesion identified. Within normal limits in parenchymal echogenicity. Portal vein is patent on color Doppler imaging with normal direction of blood flow towards the liver. Other: None. IMPRESSION: Unremarkable right upper quadrant ultrasound. Electronically Signed   By: Jacqulynn Cadet M.D.   On: 02/26/2021 13:40     Assessment & Plan:  Jaxton Casale is a 75 y.o. male with vasculitis of the large vessels and request for a temporal artery biopsy. We discussed plan on the left given that I cannot palpate anything on the  right. Discussed risk of bleeding, infection, numbness/ nerve damage, risk of non diagnostic findings.   -Will update his daughter -NPO midnight, Consent orders placed, he is oriented All questions were answered to the satisfaction of the patient. -If he is transferred to [facility] we will cancel the case, this should not prevent his transfer or delay his transfer.     Virl Cagey 02/27/2021, 3:51 PM

## 2021-02-27 NOTE — Progress Notes (Signed)
[name], M.D. Gastroenterology & Hepatology   Interval History: No acute events overnight. Patient reports feeling well, states he has some improvement in his vision on his left eye. Has not presented any fever spikes in the last 24 hours and has remained hemodynamically stable.  Noted to have increased in his white blood cell count up to 16,000 but he was started yesterday on steroids by hospitalist.  Notably his IgG was normal, iron stores showed very elevated ferritin of 1128 but his iron and iron saturation were low.  AST and ALT are slightly better compared to yesterday with AST of 69 and ALT of 145, alkaline phosphatase is stable to 58, total bilirubin is normal 0.7. Patient is scheduled for a possible biopsy of left temporal artery.  Patient is waiting to get transferred to [facility].  Inpatient Medications:  Current Facility-Administered Medications:  .  0.9 %  sodium chloride infusion, , Intravenous, Continuous, Tat, David, MD, Last Rate: 75 mL/hr at 02/26/21 1458, New Bag at 02/26/21 1458 .  acetaminophen (TYLENOL) tablet 650 mg, 650 mg, Oral, Q6H PRN, Adefeso, Oladapo, DO, 650 mg at 02/25/21 0805 .  amLODipine (NORVASC) tablet 5 mg, 5 mg, Oral, Daily, Adefeso, Oladapo, DO, 5 mg at 02/27/21 0956 .  brimonidine (ALPHAGAN) 0.2 % ophthalmic solution 1 drop, 1 drop, Left Eye, TID, Adefeso, Oladapo, DO, 1 drop at 02/27/21 0957 .  feeding supplement (ENSURE ENLIVE / ENSURE PLUS) liquid 237 mL, 237 mL, Oral, BID BM, Adefeso, Oladapo, DO, 237 mL at 02/27/21 0956 .  heparin injection 5,000 Units, 5,000 Units, Subcutaneous, Q8H, Adefeso, Oladapo, DO, 5,000 Units at 02/27/21 0554 .  methylPREDNISolone sodium succinate (SOLU-MEDROL) 125 mg/2 mL injection 80 mg, 80 mg, Intravenous, Daily, Tat, David, MD, 80 mg at 02/27/21 0956 .  ondansetron (ZOFRAN) injection 4 mg, 4 mg, Intravenous, Q6H PRN, Tat, David, MD .  pantoprazole (PROTONIX) injection 40 mg, 40 mg, Intravenous, Q24H, Elodia Florence., MD, 40 mg at 02/26/21 1704 .  piperacillin-tazobactam (ZOSYN) IVPB 3.375 g, 3.375 g, Intravenous, Q8H, Adefeso, Oladapo, DO, Last Rate: 12.5 mL/hr at 02/27/21 1001, 3.375 g at 02/27/21 1001 .  prednisoLONE acetate (PRED FORTE) 1 % ophthalmic suspension 1 drop, 1 drop, Left Eye, QID, Adefeso, Oladapo, DO, 1 drop at 02/27/21 1001 .  timolol (TIMOPTIC) 0.5 % ophthalmic solution 1 drop, 1 drop, Left Eye, BID, Adefeso, Oladapo, DO, 1 drop at 02/27/21 0958   I/O    Intake/Output Summary (Last 24 hours) at 02/27/2021 1032 Last data filed at 02/27/2021 0600 Gross per 24 hour  Intake 153.32 ml  Output 400 ml  Net -246.68 ml     Physical Exam: Temp:  [98.1 F (36.7 C)-98.8 F (37.1 C)] 98.8 F (37.1 C) (05/08 0739) Pulse Rate:  [51-72] 54 (05/08 0739) Resp:  [16-18] 16 (05/08 0739) BP: (113-148)/(56-72) 144/72 (05/08 0739) SpO2:  [96 %-100 %] 99 % (05/08 0739)  Temp (24hrs), Avg:98.4 F (36.9 C), Min:98.1 F (36.7 C), Max:98.8 F (37.1 C)  GENERAL: The patient is AO x3, in no acute distress. HEENT: Head is normocephalic and atraumatic. EOMI are intact. Mouth is well hydrated and without lesions. NECK: Supple. No masses LUNGS: Clear to auscultation. No presence of rhonchi/wheezing/rales. Adequate chest expansion HEART: RRR, normal s1 and s2. ABDOMEN: Soft, nontender, no guarding, no peritoneal signs, and nondistended. BS +. No masses. EXTREMITIES: Without any cyanosis, clubbing, rash, lesions or edema. NEUROLOGIC: AOx3, no focal motor deficit. SKIN: no jaundice, no rashes  Laboratory Data: CBC:  Component Value Date/Time   WBC 19.7 (H) 02/27/2021 0607   RBC 3.81 (L) 02/27/2021 0607   HGB 10.9 (L) 02/27/2021 0607   HCT 34.8 (L) 02/27/2021 0607   PLT 634 (H) 02/27/2021 0607   MCV 91.3 02/27/2021 0607   MCH 28.6 02/27/2021 0607   MCHC 31.3 02/27/2021 0607   RDW 13.3 02/27/2021 0607   LYMPHSABS 1.0 02/26/2021 0629   MONOABS 0.8 02/26/2021 0629   EOSABS 0.0 02/26/2021 0629    BASOSABS 0.0 02/26/2021 0629   COAG:  Lab Results  Component Value Date   INR 1.3 (H) 02/25/2021   INR 1.2 02/24/2021   INR 1.11 02/10/2016    BMP:  BMP Latest Ref Rng & Units 02/27/2021 02/26/2021 02/25/2021  Glucose 70 - 99 mg/dL 133(H) 140(H) 125(H)  BUN 8 - 23 mg/dL 33(H) 31(H) 30(H)  Creatinine 0.61 - 1.24 mg/dL 1.18 1.29(H) 1.34(H)  Sodium 135 - 145 mmol/L 141 139 138  Potassium 3.5 - 5.1 mmol/L 4.5 4.1 3.2(L)  Chloride 98 - 111 mmol/L 106 108 107  CO2 22 - 32 mmol/L 27 22 22   Calcium 8.9 - 10.3 mg/dL 8.4(L) 8.3(L) 7.9(L)    HEPATIC:  Hepatic Function Latest Ref Rng & Units 02/27/2021 02/26/2021 02/25/2021  Total Protein 6.5 - 8.1 g/dL 6.7 6.2(L) 6.2(L)  Albumin 3.5 - 5.0 g/dL 2.1(L) 1.9(L) 2.1(L)  AST 15 - 41 U/L 69(H) 63(H) 98(H)  ALT 0 - 44 U/L 145(H) 129(H) 127(H)  Alk Phosphatase 38 - 126 U/L 258(H) 245(H) 250(H)  Total Bilirubin 0.3 - 1.2 mg/dL 0.7 0.6 1.3(H)    CARDIAC:  Lab Results  Component Value Date   TROPONINI <0.03 07/10/2016      Imaging: I personally reviewed and interpreted the available labs, imaging and endoscopic files.   Assessment/Plan: This is a 75 y.o. male with history of hypertension, glaucoma, brain tumor s/p resection, prostate cancer s/p resection, hypertension and hyperlipidemia, who came to the hospital after presenting progressive weakness and new onset of decreased visual acuity in his left eye.  Gastroenterology was consulted after the patient was found to have distal small bowel enteritis in recent CT scan which was performed as part of the evaluation of endoscopic submucosal dissection leukocytosis.  The patient denies having ongoing active gastrointestinal symptoms of the moment.  The patient has a large constellation of symptoms including weakness, changes in vision (unclear if related to uveitis), possible vasculitis on imaging and elevated liver enzymes which raises concern for multisystemic involvement such as vasculitis, less likely due to  lupus.  This could also explain the enteritis he is presenting.  I think it is less likely that this is related to Crohn's disease but it is warranted that he gets a colonoscopy. We will plan to perform this as outpatient.  I consider is appropriate to have evaluation by rheumatology, as well as having further evaluation with autoimmune serologies - pending ANCA, IgG, anti-smooth muscle antibody, antidouble-stranded DNA, anti-smooth antibody, antimitochondrial antibody, IgG, IgG4. Notably his acute hepatitis panel is negative.  #Enteritis #Multisystemic inflammation, concern for vasculitis - Follow up ANCA, IgG, anti-smooth muscle antibody, antidouble-stranded DNA, anti-smooth antibody, antimitochondrial antibody, IgG, IgG4 - Advance diet as tolerated - Consider rheumatology evaluation - Will plan outpatient colonoscopy - Patient will follow up in GI clinic with Dr. Jenetta Downer in 4 weeks. - GI service will sign-off, please call us back if you have any more questions.  [name], MD Gastroenterology and Hepatology [facility]

## 2021-02-28 ENCOUNTER — Inpatient Hospital Stay (HOSPITAL_COMMUNITY): Payer: Medicare Other | Admitting: Anesthesiology

## 2021-02-28 ENCOUNTER — Encounter (HOSPITAL_COMMUNITY)
Admission: EM | Disposition: A | Discharge status: Home / Self Care | Payer: Self-pay | Source: Home / Self Care | Attending: Internal Medicine

## 2021-02-28 DIAGNOSIS — I776 Arteritis, unspecified: Secondary | ICD-10-CM | POA: Diagnosis not present

## 2021-02-28 HISTORY — PX: ARTERY BIOPSY: SHX891

## 2021-02-28 LAB — QUANTIFERON-TB GOLD PLUS (RQFGPL)
QuantiFERON Mitogen Value: 0.23 IU/mL
QuantiFERON Nil Value: 0 IU/mL
QuantiFERON TB1 Ag Value: 0 IU/mL
QuantiFERON TB2 Ag Value: 0 IU/mL

## 2021-02-28 LAB — ANTI-DNA ANTIBODY, DOUBLE-STRANDED: ds DNA Ab: <1 IU/mL (ref 0–9)

## 2021-02-28 LAB — MITOCHONDRIAL ANTIBODIES: Mitochondrial M2 Ab, IgG: <20 Units (ref 0.0–20.0)

## 2021-02-28 LAB — QUANTIFERON-TB GOLD PLUS: QuantiFERON-TB Gold Plus: UNDETERMINED — AB

## 2021-02-28 LAB — ANTI-SMITH ANTIBODY: ENA SM Ab Ser-aCnc: <0.2 AI (ref 0.0–0.9)

## 2021-02-28 LAB — SJOGRENS SYNDROME-B EXTRACTABLE NUCLEAR ANTIBODY: SSB (La) (ENA) Antibody, IgG: <0.2 AI (ref 0.0–0.9)

## 2021-02-28 LAB — MPO/PR-3 (ANCA) ANTIBODIES
ANCA Proteinase 3: <3.5 U/mL (ref 0.0–3.5)
Myeloperoxidase Abs: <9 U/mL (ref 0.0–9.0)

## 2021-02-28 LAB — SJOGRENS SYNDROME-A EXTRACTABLE NUCLEAR ANTIBODY: SSA (Ro) (ENA) Antibody, IgG: <0.2 AI (ref 0.0–0.9)

## 2021-02-28 SURGERY — BIOPSY TEMPORAL ARTERY
Anesthesia: General | Site: Head | Laterality: Left

## 2021-02-28 MED ORDER — AMLODIPINE BESYLATE 5 MG PO TABS
10.0000 mg | ORAL_TABLET | Freq: Every day | ORAL | Status: DC
  Administered 2021-03-01 – 2021-03-02 (×2): 10 mg via ORAL
  Filled 2021-02-28 (×2): qty 2

## 2021-02-28 MED ORDER — MORPHINE SULFATE (PF) 2 MG/ML IV SOLN: 1.0000 mg | INTRAVENOUS | Status: DC | PRN

## 2021-02-28 MED ORDER — LIDOCAINE HCL (PF) 1 % IJ SOLN
INTRAMUSCULAR | Status: DC | PRN
  Administered 2021-02-28: 1 mL via INTRADERMAL

## 2021-02-28 MED ORDER — AMLODIPINE BESYLATE 5 MG PO TABS: 5.0000 mg | ORAL_TABLET | Freq: Every day | ORAL | Status: DC

## 2021-02-28 MED ORDER — PROPOFOL 10 MG/ML IV BOLUS
INTRAVENOUS | Status: DC | PRN
  Administered 2021-02-28: 150 mg via INTRAVENOUS

## 2021-02-28 MED ORDER — ONDANSETRON HCL 4 MG/2ML IJ SOLN
INTRAMUSCULAR | Status: DC | PRN
  Administered 2021-02-28: 4 mg via INTRAVENOUS

## 2021-02-28 MED ORDER — FENTANYL CITRATE (PF) 100 MCG/2ML IJ SOLN
INTRAMUSCULAR | Status: AC
  Filled 2021-02-28: qty 2

## 2021-02-28 MED ORDER — FENTANYL CITRATE (PF) 100 MCG/2ML IJ SOLN: 25.0000 ug | INTRAMUSCULAR | Status: DC | PRN

## 2021-02-28 MED ORDER — FENTANYL CITRATE (PF) 100 MCG/2ML IJ SOLN
INTRAMUSCULAR | Status: DC | PRN
  Administered 2021-02-28: 25 ug via INTRAVENOUS

## 2021-02-28 MED ORDER — LIDOCAINE HCL (PF) 1 % IJ SOLN
INTRAMUSCULAR | Status: AC
  Filled 2021-02-28: qty 30

## 2021-02-28 MED ORDER — OXYCODONE HCL 5 MG PO TABS
5.0000 mg | ORAL_TABLET | ORAL | Status: DC | PRN
  Administered 2021-03-01 – 2021-03-02 (×2): 5 mg via ORAL
  Filled 2021-02-28 (×2): qty 1

## 2021-02-28 MED ORDER — AMLODIPINE BESYLATE 5 MG PO TABS
5.0000 mg | ORAL_TABLET | Freq: Once | ORAL | Status: AC
  Administered 2021-02-28: 5 mg via ORAL

## 2021-02-28 MED ORDER — LACTATED RINGERS IV SOLN: INTRAVENOUS | Status: DC

## 2021-02-28 MED ORDER — LIDOCAINE HCL (CARDIAC) PF 100 MG/5ML IV SOSY
PREFILLED_SYRINGE | INTRAVENOUS | Status: DC | PRN
  Administered 2021-02-28: 50 mg via INTRATRACHEAL

## 2021-02-28 MED ORDER — CHLORHEXIDINE GLUCONATE 0.12 % MT SOLN: 15.0000 mL | Freq: Once | OROMUCOSAL | Status: AC

## 2021-02-28 MED ORDER — PHENYLEPHRINE 40 MCG/ML (10ML) SYRINGE FOR IV PUSH (FOR BLOOD PRESSURE SUPPORT)
PREFILLED_SYRINGE | INTRAVENOUS | Status: DC | PRN
  Administered 2021-02-28 (×2): 40 ug via INTRAVENOUS

## 2021-02-28 MED ORDER — PROPOFOL 10 MG/ML IV BOLUS
INTRAVENOUS | Status: AC
  Filled 2021-02-28: qty 40

## 2021-02-28 MED ORDER — LIDOCAINE HCL (PF) 2 % IJ SOLN
INTRAMUSCULAR | Status: AC
  Filled 2021-02-28: qty 5

## 2021-02-28 MED ORDER — CEFAZOLIN SODIUM-DEXTROSE 2-4 GM/100ML-% IV SOLN
INTRAVENOUS | Status: AC
  Filled 2021-02-28: qty 100

## 2021-02-28 MED ORDER — ORAL CARE MOUTH RINSE
15.0000 mL | Freq: Once | OROMUCOSAL | Status: AC
  Administered 2021-02-28: 15 mL via OROMUCOSAL

## 2021-02-28 MED ORDER — EPHEDRINE 5 MG/ML INJ
INTRAVENOUS | Status: AC
  Filled 2021-02-28: qty 10

## 2021-02-28 MED ORDER — 0.9 % SODIUM CHLORIDE (POUR BTL) OPTIME
TOPICAL | Status: DC | PRN
  Administered 2021-02-28: 1000 mL

## 2021-02-28 MED ORDER — HYDRALAZINE HCL 25 MG PO TABS
25.0000 mg | ORAL_TABLET | Freq: Three times a day (TID) | ORAL | Status: DC
  Administered 2021-02-28 – 2021-03-02 (×7): 25 mg via ORAL
  Filled 2021-02-28 (×11): qty 1

## 2021-02-28 SURGICAL SUPPLY — 40 items
ADH SKN CLS APL DERMABOND .7 (GAUZE/BANDAGES/DRESSINGS) ×1
BAG HAMPER (MISCELLANEOUS) ×2 IMPLANT
BALL CTTN STRL GZE (GAUZE/BANDAGES/DRESSINGS) ×1
BLADE SURG 15 STRL LF DISP TIS (BLADE) ×2 IMPLANT
BLADE SURG 15 STRL SS (BLADE) ×4
CLOTH BEACON ORANGE TIMEOUT ST (SAFETY) ×2 IMPLANT
COTTON BALL STERILE (GAUZE/BANDAGES/DRESSINGS) ×2
COTTON BALL STERILE 2 PK (GAUZE/BANDAGES/DRESSINGS) ×1 IMPLANT
COVER LIGHT HANDLE STERIS (MISCELLANEOUS) ×4 IMPLANT
COVER WAND RF STERILE (DRAPES) ×2 IMPLANT
DECANTER SPIKE VIAL GLASS SM (MISCELLANEOUS) ×2 IMPLANT
DERMABOND ADVANCED (GAUZE/BANDAGES/DRESSINGS) ×1
DERMABOND ADVANCED .7 DNX12 (GAUZE/BANDAGES/DRESSINGS) ×1 IMPLANT
DRAPE EENT ADH APERT 31X51 STR (DRAPES) ×2 IMPLANT
ELECT NDL TIP 2.8 STRL (NEEDLE) ×1 IMPLANT
ELECT NEEDLE TIP 2.8 STRL (NEEDLE) ×2 IMPLANT
ELECT REM PT RETURN 9FT ADLT (ELECTROSURGICAL) ×2
ELECTRODE REM PT RTRN 9FT ADLT (ELECTROSURGICAL) ×1 IMPLANT
GAUZE 4X4 16PLY RFD (DISPOSABLE) ×2 IMPLANT
GLOVE SURG ENC MOIS LTX SZ6.5 (GLOVE) ×2 IMPLANT
GLOVE SURG UNDER POLY LF SZ6.5 (GLOVE) ×2 IMPLANT
GLOVE SURG UNDER POLY LF SZ7 (GLOVE) ×2 IMPLANT
GOWN STRL REUS W/TWL LRG LVL3 (GOWN DISPOSABLE) ×4 IMPLANT
KIT BLADEGUARD II DBL (SET/KITS/TRAYS/PACK) ×2 IMPLANT
MANIFOLD NEPTUNE II (INSTRUMENTS) ×2 IMPLANT
MARKER SKIN DUAL TIP RULER LAB (MISCELLANEOUS) ×2 IMPLANT
NDL HYPO 25X5/8 SAFETYGLIDE (NEEDLE) ×1 IMPLANT
NEEDLE HYPO 25X5/8 SAFETYGLIDE (NEEDLE) ×2 IMPLANT
NS IRRIG 1000ML POUR BTL (IV SOLUTION) ×2 IMPLANT
PACK BASIC III (CUSTOM PROCEDURE TRAY) ×2
PACK SRG BSC III STRL LF ECLPS (CUSTOM PROCEDURE TRAY) ×1 IMPLANT
PAD ARMBOARD 7.5X6 YLW CONV (MISCELLANEOUS) ×3 IMPLANT
PAD TELFA 3X4 1S STER (GAUZE/BANDAGES/DRESSINGS) ×2 IMPLANT
PENCIL SMOKE EVACUATOR COATED (MISCELLANEOUS) ×2 IMPLANT
SUT MNCRL AB 4-0 PS2 18 (SUTURE) ×2 IMPLANT
SUT SILK 3 0 (SUTURE) ×2
SUT SILK 3-0 18XBRD TIE 12 (SUTURE) IMPLANT
SUT VIC AB 3-0 SH 27 (SUTURE) ×2
SUT VIC AB 3-0 SH 27X BRD (SUTURE) ×1 IMPLANT
SYR CONTROL 10ML LL (SYRINGE) ×2 IMPLANT

## 2021-02-28 NOTE — Anesthesia Preprocedure Evaluation (Signed)
Anesthesia Evaluation  Patient identified by MRN, date of birth, ID band Patient awake    Reviewed: Allergy & Precautions, NPO status , Patient's Chart, lab work & pertinent test results  Airway Mallampati: II  TM Distance: >3 FB Neck ROM: Full    Dental   Pulmonary former smoker,    Pulmonary exam normal breath sounds clear to auscultation       Cardiovascular Exercise Tolerance: Good hypertension, Pt. on medications Normal cardiovascular exam Rhythm:Regular Rate:Normal     Neuro/Psych Brain tumor    GI/Hepatic GERD  Medicated,  Endo/Other  negative endocrine ROS  Renal/GU Renal InsufficiencyRenal disease     Musculoskeletal negative musculoskeletal ROS (+)   Abdominal   Peds  Hematology negative hematology ROS (+)   Anesthesia Other Findings   Reproductive/Obstetrics negative OB ROS                             Anesthesia Physical Anesthesia Plan  ASA: III  Anesthesia Plan: General   Post-op Pain Management:    Induction: Intravenous  PONV Risk Score and Plan: 3 and Ondansetron and Dexamethasone  Airway Management Planned: LMA  Additional Equipment:   Intra-op Plan:   Post-operative Plan:   Informed Consent: I have reviewed the patients History and Physical, chart, labs and discussed the procedure including the risks, benefits and alternatives for the proposed anesthesia with the patient or authorized representative who has indicated his/her understanding and acceptance.     Dental advisory given  Plan Discussed with: CRNA and Surgeon  Anesthesia Plan Comments:         Anesthesia Quick Evaluation

## 2021-02-28 NOTE — Plan of Care (Signed)
  Problem: Education: Goal: Knowledge of General Education information will improve Description: Including pain rating scale, medication(s)/side effects and non-pharmacologic comfort measures Outcome: Progressing   Problem: Health Behavior/Discharge Planning: Goal: Ability to manage health-related needs will improve Outcome: Progressing   Problem: Clinical Measurements: Goal: Will remain free from infection Outcome: Progressing   Problem: Nutrition: Goal: Adequate nutrition will be maintained Outcome: Progressing   Problem: Elimination: Goal: Will not experience complications related to urinary retention Outcome: Progressing   Problem: Pain Managment: Goal: General experience of comfort will improve Outcome: Progressing   Problem: Safety: Goal: Ability to remain free from injury will improve Outcome: Progressing   Problem: Skin Integrity: Goal: Risk for impaired skin integrity will decrease Outcome: Progressing

## 2021-02-28 NOTE — Anesthesia Procedure Notes (Signed)
Procedure Name: LMA Insertion Date/Time: 02/28/2021 1:27 PM Performed by: Jonna Munro, CRNA Pre-anesthesia Checklist: Patient identified, Emergency Drugs available, Suction available, Patient being monitored and Timeout performed Patient Re-evaluated:Patient Re-evaluated prior to induction Oxygen Delivery Method: Circle system utilized Preoxygenation: Pre-oxygenation with 100% oxygen Induction Type: IV induction LMA: LMA inserted LMA Size: 4.0 Number of attempts: 1 Placement Confirmation: positive ETCO2 and breath sounds checked- equal and bilateral Tube secured with: Tape Dental Injury: Teeth and Oropharynx as per pre-operative assessment

## 2021-02-28 NOTE — Transfer of Care (Signed)
Immediate Anesthesia Transfer of Care Note  Patient: Curtis Marquez  Procedure(s) Performed: BIOPSY TEMPORAL ARTERY (Left Head)  Patient Location: PACU  Anesthesia Type:General  Level of Consciousness: awake, alert , oriented and patient cooperative  Airway & Oxygen Therapy: Patient Spontanous Breathing  Post-op Assessment: Report given to RN, Post -op Vital signs reviewed and stable and Patient moving all extremities X 4  Post vital signs: Reviewed and stable  Last Vitals:  Vitals Value Taken Time  BP    Temp    Pulse    Resp 20 02/28/21 1414  SpO2    Vitals shown include unvalidated device data.  Last Pain:  Vitals:   02/28/21 1301  TempSrc: Oral  PainSc: 0-No pain      Patients Stated Pain Goal: 5 (14/60/47 9987)  Complications: No complications documented.

## 2021-02-28 NOTE — Op Note (Signed)
[facility] Operative Note  02/28/21  Preoperative Diagnosis: Vasculitis    Postoperative Diagnosis: Same   Procedure(s) Performed:  Temporal artery biopsy, left side    Surgeon: Lanell Matar. Constance Haw, MD   Assistants: No qualified resident was available    Anesthesia: General endotracheal   Anesthesiologist: Denese Killings, MD    Specimens:  Left temporal artery   Estimated Blood Loss: Minimal   Blood Replacement: None    Complications: None   Wound Class: Clean    Operative Indications:  Mr. Simonis is a 75 yo who has presented with large vessel vasculitis and I have been asked to do a biopsy. We discussed risk of bleeding, nerve damage, numbness, infection, nondiagnostic sample and he opted to proceed.   Findings: Prominent temporal artery on left    Procedure: The patient was taken to the operating room and placed supine. General endotracheal anesthesia was induced. Intravenous antibiotics were administered per protocol. The left temporal area was prepared and draped in the usual sterile fashion.   An incision was made in the hairline over the artery which was prominent and palpable. Using blunt and sharp dissection the artery was dissected out. The proximal and distal ends were ligated with a 3-0 silk suture and a small branch was ligated with a 3-0 silk suture. The artery biopsy was removed and was over 1 cm in size. We had confirmed with pathology that it could be placed in formalin and sent to pathology.  The wound was hemostatic. The deep space was closed with a 3-0 interrupted Vicryl and the skin was closed with 4-0 subcuticular Monocryl and dermabond.   Final inspection revealed acceptable hemostasis. All counts were correct at the end of the case. The patient was awakened from anesthesia without complication.  The patient went to the PACU in stable condition.   Curlene Labrum, MD [facility] [address] [place], [place] [address] [phone] (office)

## 2021-02-28 NOTE — Progress Notes (Addendum)
PROGRESS NOTE  Curtis Marquez QQI:[id number] DOB: Aug 04, 1946 DOA: 02/24/2021 PCP: Monico Blitz, MD  Brief History: Curtis Marquez a 75 y.o.malewith medical history significant forhypertension, glaucoma, s/p brain tumor excision (1994), prostate cancer s/p robotic assisted laparoscopic radical prostatectomy (January 2015) who presents to the emergency department via EMS due to generalized weakness, fever and confusion. Per report, patient has had progressive weakness and confusion with poor appetite since last visit to the ED (5/2). 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  He was started on steroid eye drops.  However, he continued to have eye pain and decreased vision.  He represented with fatigue and general malaise, fevers and eyepain on 02/24/21. Imaging is notable for enteritis, aortitis, and possible vasculitis. Admitted for further workup.  He was started on empiric antibiotics and IV steroids.  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--GCA, Takayasu's, IgG4 or Behcet's -RPR negative -continue empiriczosyn for now -Recurrent fevers, leukocytosis -> meets criteria for SIRS -CT with prominent wall thickening involving distal small bowel in the pelvis with associated inflammatory changes - c/w enteritis of infectious, inflammatory or ischemic etiology - also with abnormal appearance of aortic arch and proximal descending thoracic aorta which demonstrates indistinct mural thickening and surrounding inflammatory change, suspicious for aortitis/vasculitis -ESR 120, CRP 21.8 -Procal 0.81>>0.51 -Follow autoimmune labs - ANCA titers, MPO/PR3, anti CCP, dsDNA,  Anti-Sm, SS-A, SS-B -check IgG4--pending -ANA--NEG -Anti-Sm, anti-DNA, anti-SSA and anti-SSB---neg -C3--200 and C4 27 -RF--23.9 -Quant gold --pending -acute viral hepatitis panel--neg -Discussed with vascular, recommended discussion with ID or rheum--reviewed CT--no dissection noted -No rheum in house, discussed with [facility] rheum.  -Blood cultures--neg to date -Discussed case with ID recommended repeating blood cultures. Consider d/c abx if cx negative at 48 hrs. Carotid dopplers without evidence of vasculitis (moderate bilateral atherosclerotic plaque, no hemodynamically significant stenosis) -Of note, discussed with his ophthalmology office who note his exam was c/w uveitis (increased IOP thought 2/2 to this). His presentation with eye pain, vision loss, concerning for GCA in this presentation, though uveitis doesn't appear to be common finding with this. No jaw claudication, discomfort with palpation. 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 Peter Minium at [facility]-->patient is now on wait list for transfer to [place] -02/28/21--called [facility]--pt remains on wait list; I gave update on clinical info  Generalized Weakness  Altered Mental Status -No focal neuro deficits.  -remains A&Ox~2 today when I spoke to him. -Suspect this is 2/2 above. -Head CT with chronic small vessel ischemic changes. -5/8--seems a little better, but clearly not at baseline -5/9--mental status--slowly improving  Essential Hypertension -increase amlodipine to 10 mg daily -start hydralazine 25 mg tid  Elevated LFT's -acute hepatitis panel -RUQ ultrasound--neg -suspect a degree of autoimmune hepatitis vs autoimmune cholangitis -coags unremarkable -largely stable -continue to trend  Acute Kidney Injury -Baseline0.8-1.0 -1.61 at presentation, improving,  continue IVF>>1.18  Glaucoma  Uveitis -Continue eye drops -eye pain and visual acuity gradually improving -02/28/21-temporal artery biopsy      Status is: Inpatient  Remains inpatient appropriate because:Inpatient level of care appropriate due to severity of illness   Dispo: The patient is from:Home Anticipated d/c  is to:Home Patient currently is not medically stable to d/c. Difficult to place patient No        Family Communication:Daughter updated 5/9  Consultants:General surgery; GI  Code Status: FULL   DVT Prophylaxis: [place] Heparin    Procedures: As Listed in Progress Note Above  Antibiotics: Zosyn 5/6>>5/9     Subjective: Patient states that eye pain and vision are gradually improving.  Denies f/c cp, n/v/d, abd pain.  He has occasional frontal headache.  Objective: Vitals:   02/28/21 1430 02/28/21 1440 02/28/21 1445 02/28/21 1453  BP: (!) 174/98 (!) 172/99 (!) 175/99   Pulse: (!) 56 72 (!) 56   Resp: 16 20 (!) 21   Temp:    98.5 F (36.9 C)  TempSrc:      SpO2: 98% 97% 97%   Weight:      Height:        Intake/Output Summary (Last 24 hours) at 02/28/2021 1455 Last data filed at 02/28/2021 1358 Gross per 24 hour  Intake 756.03 ml  Output 1205 ml  Net -448.97 ml   Weight change:  Exam:   General:  Pt is alert, follows commands appropriately, not in acute distress  HEENT: No icterus, No thrush, No neck mass, [place]/AT  Cardiovascular: RRR, S1/S2, no rubs, no gallops  Respiratory: CTA bilaterally, no wheezing, no crackles, no rhonchi  Abdomen: Soft/+BS, non tender, non distended, no guarding  Extremities: No edema, No lymphangitis, No petechiae, No rashes, no synovitis  Neuro:  CN II-XII intact, strength 4/5 in RUE, RLE, strength 4/5 LUE, LLE; sensation intact bilateral; no dysmetria; babinski equivocal     Data Reviewed: I have personally reviewed following labs and  imaging studies Basic Metabolic Panel: Recent Labs  Lab 02/21/21 1920 02/24/21 1745 02/25/21 0156 02/25/21 0445 02/26/21 0629 02/27/21 0607  NA 139 139  --  138 139 141  K 3.3* 3.6  --  3.2* 4.1 4.5  CL 101 106  --  107 108 106  CO2 26 23  --  _0 GLUCOSE 125* 110*  --  125* 140* 133*  BUN 12 35*  --  30* 31* 33*  CREATININE 0.88 1.61* 1.40* 1.34* 1.29* 1.18  CALCIUM 8.5* 8.6*  --  7.9* 8.3* 8.4*  MG  --   --   --  2.4 2.5*  --   PHOS  --   --   --  3.9 4.0  --    Liver Function Tests: Recent Labs  Lab 02/21/21 1920 02/24/21 1745 02/25/21 0445 02/26/21 0629 02/27/21 0607  AST 17 95* 98* 63* 69*  ALT 49* 126* 127* 129* 145*  ALKPHOS 191* 253* 250* 245* 258*  BILITOT 1.0 1.0 1.3* 0.6 0.7  PROT 8.0 7.3 6.2* 6.2* 6.7  ALBUMIN 3.0* 2.4* 2.1* 1.9* 2.1*   Recent Labs  Lab 02/21/21 1920  LIPASE 23   No results for input(s): AMMONIA in the last 168 hours. Coagulation Profile: Recent Labs  Lab 02/24/21 1745 02/25/21 0445  INR 1.2 1.3*   CBC: Recent Labs  Lab 02/21/21 1920 02/24/21 1745 02/25/21 0156 02/25/21 0445 02/26/21 0629 02/27/21 0607  WBC 16.7* 15.7* 17.2* 17.3* 16.9* 19.7*  NEUTROABS 14.1* 13.2*  --   --  15.0*  --   HGB 12.9* 12.1* 10.4* 10.2* 10.1* 10.9*  HCT 39.9 37.7* 32.6* 32.0* 31.6* 34.8*  MCV 90.1 90.6 90.6 90.4 90.8 91.3  PLT 604* 641* 509* 494* 551* 634*   Cardiac Enzymes: No results for input(s):  CKTOTAL, CKMB, CKMBINDEX, TROPONINI in the last 168 hours. BNP: Invalid input(s): POCBNP CBG: Recent Labs  Lab 02/26/21 2217 02/27/21 2157  GLUCAP 193* 110*   HbA1C: No results for input(s): HGBA1C in the last 72 hours. Urine analysis:    Component Value Date/Time   COLORURINE YELLOW 02/25/2021 0118   APPEARANCEUR CLEAR 02/25/2021 0118   LABSPEC >1.046 (H) 02/25/2021 0118   PHURINE 5.0 02/25/2021 0118   GLUCOSEU NEGATIVE 02/25/2021 0118   HGBUR NEGATIVE 02/25/2021 0118   BILIRUBINUR NEGATIVE 02/25/2021 0118   KETONESUR 5  (A) 02/25/2021 0118   PROTEINUR NEGATIVE 02/25/2021 0118   NITRITE NEGATIVE 02/25/2021 0118   LEUKOCYTESUR NEGATIVE 02/25/2021 0118   Sepsis Labs: _0 (procalcitonin:4,lacticidven:4) ) Recent Results (from the past 240 hour(s))  Culture, blood (routine x 2)     Status: None   Collection Time: 02/21/21  7:30 PM   Specimen: BLOOD  Result Value Ref Range Status   Specimen Description BLOOD RIGHT ANTECUBITAL  Final   Special Requests   Final    BOTTLES DRAWN AEROBIC AND ANAEROBIC Blood Culture adequate volume   Culture   Final    NO GROWTH 5 DAYS Performed at [facility], [address] [place], [place] [address]    Report Status 02/26/2021 FINAL  Final  Culture, blood (routine x 2)     Status: None   Collection Time: 02/21/21  7:30 PM   Specimen: BLOOD RIGHT FOREARM  Result Value Ref Range Status   Specimen Description BLOOD RIGHT FOREARM  Final   Special Requests   Final    BOTTLES DRAWN AEROBIC AND ANAEROBIC Blood Culture adequate volume   Culture   Final    NO GROWTH 5 DAYS Performed at [facility], [address], [place], [place] [address]    Report Status 02/26/2021 FINAL  Final  Urine culture     Status: Abnormal   Collection Time: 02/21/21  9:49 PM   Specimen: Urine, Clean Catch  Result Value Ref Range Status   Specimen Description   Final    URINE, CLEAN CATCH Performed at [facility], [address] [place], [place] [address]    Special Requests   Final    NONE Performed at [facility], [address], [place], [place] [address]    Culture (A)  Final    10,000 COLONIES/mL AEROCOCCUS URINAE Standardized susceptibility testing for this organism is not available. Performed at [facility], [place] [address] [place], [place] [address]    Report Status 02/25/2021 FINAL  Final  SARS CORONAVIRUS 2 ( 6-24 HRS) Nasopharyngeal Nasopharyngeal Swab     Status: None   Collection Time: 02/24/21  5:32 PM   Specimen: Nasopharyngeal Swab  Result Value Ref  Range Status   SARS Coronavirus 2 NEGATIVE NEGATIVE Final    Comment: (NOTE) SARS-CoV-2 target nucleic acids are NOT DETECTED.  The SARS-CoV-2 RNA is generally detectable in upper and lower respiratory specimens during the acute phase of infection. Negative results do not preclude SARS-CoV-2 infection, do not rule out co-infections with other pathogens, and should not be used as the sole basis for treatment or other patient management decisions. Negative results must be combined with clinical observations, patient history, and epidemiological information. The expected result is Negative.  Fact Sheet for Patients: SugarRoll.be  Fact Sheet for Healthcare Providers: https://www.woods-mathews.com/  This test is not yet approved or cleared by the [place] FDA and  has been authorized for detection and/or diagnosis of SARS-CoV-2 by FDA under an  Emergency Use Authorization (EUA). This EUA will remain  in effect (meaning this test can be used) for the duration of the COVID-19 declaration under Se ction 564(b)(1) of the Act, 21 U.S.C. section 360bbb-3(b)(1), unless the authorization is terminated or revoked sooner.  Performed at [facility], [place] [address], [place], [place] [address]   Blood Culture (routine x 2)     Status: None (Preliminary result)   Collection Time: 02/24/21  5:45 PM   Specimen: BLOOD LEFT ARM  Result Value Ref Range Status   Specimen Description BLOOD LEFT ARM  Final   Special Requests   Final    BOTTLES DRAWN AEROBIC AND ANAEROBIC Blood Culture adequate volume   Culture   Final    NO GROWTH 4 DAYS Performed at [facility], [address] [place], [place] [address]    Report Status PENDING  Incomplete  Blood Culture (routine x 2)     Status: None (Preliminary result)   Collection Time: 02/24/21  5:45 PM   Specimen: BLOOD RIGHT ARM  Result Value Ref Range Status   Specimen Description BLOOD RIGHT ARM  Final    Special Requests   Final    BOTTLES DRAWN AEROBIC AND ANAEROBIC Blood Culture adequate volume   Culture   Final    NO GROWTH 4 DAYS Performed at [facility], [address] [place], [place] [address]    Report Status PENDING  Incomplete  Resp Panel by RT-PCR (Flu A&B, Covid) Nasopharyngeal Swab     Status: None   Collection Time: 02/24/21  9:06 PM   Specimen: Nasopharyngeal Swab; Nasopharyngeal(NP) swabs in vial transport medium  Result Value Ref Range Status   SARS Coronavirus 2 by RT PCR NEGATIVE NEGATIVE Final    Comment: (NOTE) SARS-CoV-2 target nucleic acids are NOT DETECTED.  The SARS-CoV-2 RNA is generally detectable in upper respiratory specimens during the acute phase of infection. The lowest concentration of SARS-CoV-2 viral copies this assay can detect is 138 copies/mL. A negative result does not preclude SARS-Cov-2 infection and should not be used as the sole basis for treatment or other patient management decisions. A negative result may occur with  improper specimen collection/handling, submission of specimen other than nasopharyngeal swab, presence of viral mutation(s) within the areas targeted by this assay, and inadequate number of viral copies(<138 copies/mL). A negative result must be combined with clinical observations, patient history, and epidemiological information. The expected result is Negative.  Fact Sheet for Patients:  EntrepreneurPulse.com.au  Fact Sheet for Healthcare Providers:  IncredibleEmployment.be  This test is no t yet approved or cleared by the [place] FDA and  has been authorized for detection and/or diagnosis of SARS-CoV-2 by FDA under an Emergency Use Authorization (EUA). This EUA will remain  in effect (meaning this test can be used) for the duration of the COVID-19 declaration under Section 564(b)(1) of the Act, 21 U.S.C.section 360bbb-3(b)(1), unless the authorization is terminated  or  revoked sooner.       Influenza A by PCR NEGATIVE NEGATIVE Final   Influenza B by PCR NEGATIVE NEGATIVE Final    Comment: (NOTE) The Xpert Xpress SARS-CoV-2/FLU/RSV plus assay is intended as an aid in the diagnosis of influenza from Nasopharyngeal swab specimens and should not be used as a sole basis for treatment. Nasal washings and aspirates are unacceptable for Xpert Xpress SARS-CoV-2/FLU/RSV testing.  Fact Sheet for Patients: EntrepreneurPulse.com.au  Fact Sheet for Healthcare Providers: IncredibleEmployment.be  This test is not yet approved or cleared by  the [employer] and has been authorized for detection and/or diagnosis of SARS-CoV-2 by FDA under an Emergency Use Authorization (EUA). This EUA will remain in effect (meaning this test can be used) for the duration of the COVID-19 declaration under Section 564(b)(1) of the Act, 21 U.S.C. section 360bbb-3(b)(1), unless the authorization is terminated or revoked.  Performed at [facility], [address] [place], [place] [address]   Urine culture     Status: None   Collection Time: 02/25/21  1:18 AM   Specimen: In/Out Cath Urine  Result Value Ref Range Status   Specimen Description   Final    IN/OUT CATH URINE Performed at [facility], [address] [place], [place] [address]    Special Requests   Final    NONE Performed at [facility], [address] [place], [place] [address]    Culture   Final    NO GROWTH Performed at [facility], [place] [address], [place], [place] [address]    Report Status 02/26/2021 FINAL  Final  Culture, blood (routine x 2)     Status: None (Preliminary result)   Collection Time: 02/25/21  5:03 PM   Specimen: Right Antecubital; Blood  Result Value Ref Range Status   Specimen Description RIGHT ANTECUBITAL  Final   Special Requests   Final    Blood Culture adequate volume BOTTLES DRAWN AEROBIC AND ANAEROBIC   Culture   Final    NO GROWTH  3 DAYS Performed at [facility], [address] [place], [place] [address]    Report Status PENDING  Incomplete  Culture, blood (routine x 2)     Status: None (Preliminary result)   Collection Time: 02/25/21  5:03 PM   Specimen: BLOOD RIGHT ARM  Result Value Ref Range Status   Specimen Description BLOOD RIGHT ARM  Final   Special Requests   Final    Blood Culture results may not be optimal due to an inadequate volume of blood received in culture bottles BOTTLES DRAWN AEROBIC ONLY   Culture   Final    NO GROWTH 3 DAYS Performed at [facility], [address] [place], [place] [address]    Report Status PENDING  Incomplete     Scheduled Meds: . [MAR Hold] amLODipine  5 mg Oral Daily  . [MAR Hold] brimonidine  1 drop Left Eye TID  . [MAR Hold] feeding supplement  237 mL Oral BID BM  . [MAR Hold] heparin  5,000 Units Subcutaneous Q8H  . [MAR Hold] methylPREDNISolone (SOLU-MEDROL) injection  80 mg Intravenous Daily  . [MAR Hold] pantoprazole (PROTONIX) IV  40 mg Intravenous Q24H  . [MAR Hold] prednisoLONE acetate  1 drop Left Eye QID  . [MAR Hold] timolol  1 drop Left Eye BID   Continuous Infusions: . sodium chloride Stopped (02/28/21 1312)  . ceFAZolin    . lactated ringers 50 mL/hr at 02/28/21 1303  . [MAR Hold] piperacillin-tazobactam (ZOSYN)  IV 3.375 g (02/28/21 0908)    Procedures/Studies: CT Head Wo Contrast  Result Date: 02/24/2021 CLINICAL DATA:  Mental status change. Weakness, loss of appetite and fever. EXAM: CT HEAD WITHOUT CONTRAST TECHNIQUE: Contiguous axial images were obtained from the base of the skull through the vertex without intravenous contrast. COMPARISON:  02/09/2016 FINDINGS: Brain: No evidence of acute infarction, hemorrhage, hydrocephalus, extra-axial collection or mass lesion/mass effect. There is mild diffuse low-attenuation within the subcortical and periventricular white matter compatible with chronic microvascular disease. Vascular: No hyperdense  vessel  or unexpected calcification. Skull: Previous right frontal craniotomy. Negative for fracture or focal lesion. Sinuses/Orbits: No acute finding. Other: None. IMPRESSION: 1. No acute intracranial abnormalities. 2. Chronic small vessel ischemic change and brain atrophy. Electronically Signed   By: Kerby Moors M.D.   On: 02/24/2021 18:54   CT CHEST ABDOMEN PELVIS W CONTRAST  Result Date: 02/24/2021 CLINICAL DATA:  Abdomen pain fever and leukocytosis EXAM: CT CHEST, ABDOMEN, AND PELVIS WITH CONTRAST TECHNIQUE: Multidetector CT imaging of the chest, abdomen and pelvis was performed following the standard protocol during bolus administration of intravenous contrast. CONTRAST:  51m OMNIPAQUE IOHEXOL 300 MG/ML  SOLN COMPARISON:  CT 02/09/2016 FINDINGS: CT CHEST FINDINGS Cardiovascular: Mild aortic atherosclerosis. Indistinct wall thickening at the great vessel origins aortic arch and proximal descending aorta. No dissection is seen. There is no aneurysmal formation. Mild aortic atherosclerosis. Cardiomegaly. Common origin of the left common carotid artery and brachiocephalic artery. Cardiomegaly. Trace pericardial effusion. Mediastinum/Nodes: Midline trachea. No thyroid mass. No suspicious adenopathy. Esophagus within normal limits Lungs/Pleura: No consolidation or pneumothorax. No significant pleural effusion Musculoskeletal: No acute or suspicious osseous abnormality. CT ABDOMEN PELVIS FINDINGS Hepatobiliary: Subcentimeter hypodensity within the anterior liver too small to further characterize. No calcified gallstone or biliary dilatation. Pancreas: Unremarkable. No pancreatic ductal dilatation or surrounding inflammatory changes. Spleen: Normal in size without focal abnormality. Adrenals/Urinary Tract: Adrenal glands within normal limits. Kidneys show no hydronephrosis. Cyst in the right kidney. The bladder is unremarkable Stomach/Bowel: The stomach is nonenlarged. Negative appendix. Wall thickening and  inflammatory change involving distal small bowel in the pelvis concerning for enteritis. Vascular/Lymphatic: Nonaneurysmal aorta. Mild aortic atherosclerosis. Possible mild mural thickening of the abdominal aorta, external iliac vessels and common femoral vessels. No suspicious nodes. Reproductive: Status post prostatectomy. Other: Small free fluid in the pelvis.  No free air. Musculoskeletal: No acute or suspicious osseous abnormality IMPRESSION: 1. Prominent wall thickening involving distal small bowel in the pelvis with associated inflammatory changes, consistent with enteritis of infectious, inflammatory, or ischemic etiology. 2. Abnormal appearance of the aortic arch and proximal descending thoracic aorta which demonstrates indistinct mural thickening and surrounding inflammatory change, suspicious for aortitis/vasculitis. This could be secondary to infectious etiologies as well as autoimmune disease. There is no aneurysmal dilatation at this time. 3. Small free fluid in the pelvis Electronically Signed   By: KDonavan FoilM.D.   On: 02/24/2021 23:31   [place]Carotid Bilateral  Result Date: 02/25/2021 CLINICAL DATA:  Vasculitis questioned on previous CT. History of hypertension and hyperlipidemia. Former smoker. EXAM: BILATERAL CAROTID DUPLEX ULTRASOUND TECHNIQUE: GPearline Cablesscale imaging, color Doppler and duplex ultrasound were performed of bilateral carotid and vertebral arteries in the neck. COMPARISON:  Carotid Doppler ultrasound-02/10/2016; CT the chest, abdomen and pelvis-02/24/2021 FINDINGS: Criteria: Quantification of carotid stenosis is based on velocity parameters that correlate the residual internal carotid diameter with NASCET-based stenosis levels, using the diameter of the distal internal carotid lumen as the denominator for stenosis measurement. The following velocity measurements were obtained: RIGHT ICA: 80/19 cm/sec CCA: 1213/08cm/sec SYSTOLIC ICA/CCA RATIO:  0.6 ECA: 104 cm/sec LEFT ICA: 91/24  cm/sec CCA: 965/78cm/sec SYSTOLIC ICA/CCA RATIO:  0.9 ECA: 110 cm/sec RIGHT CAROTID ARTERY: There is mild tortuosity involving the right common carotid artery (images 4 and 8). There is a moderate amount of eccentric mixed echogenic plaque within the right carotid bulb (image 14 and 16), extending to involve the origin and proximal aspects of the right internal carotid artery (image 24), progressed compared to the 2017  examination though not resulting in elevated peak systolic velocities within the interrogated course of the right internal carotid artery to suggest a hemodynamically significant stenosis. No discrete areas of vessel irregularity or abnormal wall thickening. RIGHT VERTEBRAL ARTERY:  Antegrade flow LEFT CAROTID ARTERY: There is a moderate amount of eccentric hypoechoic plaque involving the distal aspect the left common carotid artery (image 44 and 45). There is a moderate amount of eccentric echogenic partially shadowing plaque within the left carotid bulb (image 50), extending to involve the origin and proximal aspects of the left internal carotid artery (image 58), unchanged to slightly progressed compared to the 2017 examination though not resulting in elevated peak systolic velocities within the interrogated course of the left internal carotid artery to suggest a hemodynamically significant stenosis. No discrete areas of vessel irregularity or thickening. LEFT VERTEBRAL ARTERY:  Antegrade flow IMPRESSION: 1. Moderate amount of bilateral atherosclerotic plaque, unchanged to slightly progressed compared to remote examination performed in 2017, though not resulting in a hemodynamically significant stenosis within either internal carotid artery. 2. No sonographic evidence of vessel wall irregularity or thickening to suggest a vasculitis affecting the carotid arteries. Further evaluation with CTA could be performed as indicated. Electronically Signed   By: Sandi Mariscal M.D.   On: 02/25/2021 16:03   DG  Chest Port 1 View  Result Date: 02/24/2021 CLINICAL DATA:  Sepsis EXAM: PORTABLE CHEST 1 VIEW COMPARISON:  11/28/2018 FINDINGS: The heart size and mediastinal contours are within normal limits. Both lungs are clear. The visualized skeletal structures are unremarkable. IMPRESSION: No active disease. Electronically Signed   By: Fidela Salisbury MD   On: 02/24/2021 18:04   US Abdomen Limited RUQ (LIVER/GB)  Result Date: 02/26/2021 CLINICAL DATA:  Elevated LFTs EXAM: ULTRASOUND ABDOMEN LIMITED RIGHT UPPER QUADRANT COMPARISON:  None. FINDINGS: Gallbladder: No gallstones or wall thickening visualized. No sonographic Murphy sign noted by sonographer. Common bile duct: Diameter: Normal at 6 mm Liver: No focal lesion identified. Within normal limits in parenchymal echogenicity. Portal vein is patent on color Doppler imaging with normal direction of blood flow towards the liver. Other: None. IMPRESSION: Unremarkable right upper quadrant ultrasound. Electronically Signed   By: Jacqulynn Cadet M.D.   On: 02/26/2021 13:40    Orson Eva, DO  [facility]  If 7PM-7AM, please contact night-coverage [email or web address] Password [email or web address] 02/28/2021, 2:55 PM   LOS: 3 days

## 2021-02-28 NOTE — Interval H&P Note (Signed)
History and Physical Interval Note:  02/28/2021 12:14 PM  Curtis Marquez  has presented today for surgery, with the diagnosis of vasculitis.  The various methods of treatment have been discussed with the patient and family. After consideration of risks, benefits and other options for treatment, the patient has consented to  Procedure(s) with comments: BIOPSY TEMPORAL ARTERY (N/A) - will decide side on day of surgery as a surgical intervention.  The patient's history has been reviewed, patient examined, no change in status, stable for surgery.  I have reviewed the patient's chart and labs.  Questions were answered to the patient's satisfaction.    Left side marked.   Virl Cagey

## 2021-02-28 NOTE — Anesthesia Postprocedure Evaluation (Signed)
Anesthesia Post Note  Patient: Curtis Marquez  Procedure(s) Performed: BIOPSY TEMPORAL ARTERY (Left Head)  Patient location during evaluation: PACU Anesthesia Type: General Level of consciousness: awake and alert and oriented Pain management: pain level controlled Vital Signs Assessment: post-procedure vital signs reviewed and stable Respiratory status: spontaneous breathing and respiratory function stable Cardiovascular status: blood pressure returned to baseline and stable Postop Assessment: no apparent nausea or vomiting Anesthetic complications: no   No complications documented.   Last Vitals:  Vitals:   02/28/21 1453 02/28/21 1505  BP:  (!) 182/95  Pulse:  65  Resp:  14  Temp: 36.9 C 36.9 C  SpO2:  95%    Last Pain:  Vitals:   02/28/21 1505  TempSrc:   PainSc: 0-No pain                 [name]

## 2021-02-28 NOTE — Progress Notes (Signed)
[facility]  Notified daughter Lanetta Inch procedure done. Diet and pain meds ordered. Stitches deep and glue on top. No wound care needed.   Will forward pathology to Dr. Carles Collet and he will get it to the provider that sees him in transfer/ or outpatient.  Curlene Labrum, MD [facility] [address] [place], [place] [address] [phone] (office)

## 2021-03-01 ENCOUNTER — Encounter (HOSPITAL_COMMUNITY): Payer: Self-pay | Admitting: General Surgery

## 2021-03-01 LAB — ANCA TITERS
Atypical P-ANCA titer: <1:20 {titer}
Atypical P-ANCA titer: <1:20 {titer}
C-ANCA: <1:20 {titer}
C-ANCA: <1:20 {titer}
P-ANCA: <1:20 {titer}
P-ANCA: <1:20 {titer}

## 2021-03-01 LAB — COMPREHENSIVE METABOLIC PANEL
ALT: 178 U/L — ABNORMAL HIGH (ref 0–44)
AST: 50 U/L — ABNORMAL HIGH (ref 15–41)
Albumin: 2.1 g/dL — ABNORMAL LOW (ref 3.5–5.0)
Alkaline Phosphatase: 246 U/L — ABNORMAL HIGH (ref 38–126)
Anion gap: 8 (ref 5–15)
BUN: 17 mg/dL (ref 8–23)
CO2: 23 mmol/L (ref 22–32)
Calcium: 7.9 mg/dL — ABNORMAL LOW (ref 8.9–10.3)
Chloride: 110 mmol/L (ref 98–111)
Creatinine, Ser: 0.95 mg/dL (ref 0.61–1.24)
GFR, Estimated: >60 mL/min (ref >60)
Glucose, Bld: 110 mg/dL — ABNORMAL HIGH (ref 70–99)
Potassium: 3.7 mmol/L (ref 3.5–5.1)
Sodium: 141 mmol/L (ref 135–145)
Total Bilirubin: 0.7 mg/dL (ref 0.3–1.2)
Total Protein: 6 g/dL — ABNORMAL LOW (ref 6.5–8.1)

## 2021-03-01 LAB — CBC WITH DIFFERENTIAL/PLATELET
Abs Immature Granulocytes: 0.39 10*3/uL — ABNORMAL HIGH (ref 0.00–0.07)
Basophils Absolute: 0.1 10*3/uL (ref 0.0–0.1)
Basophils Relative: 0 %
Eosinophils Absolute: 0 10*3/uL (ref 0.0–0.5)
Eosinophils Relative: 0 %
HCT: 36 % — ABNORMAL LOW (ref 39.0–52.0)
Hemoglobin: 11.2 g/dL — ABNORMAL LOW (ref 13.0–17.0)
Immature Granulocytes: 2 %
Lymphocytes Relative: 13 %
Lymphs Abs: 2.4 10*3/uL (ref 0.7–4.0)
MCH: 28.6 pg (ref 26.0–34.0)
MCHC: 31.1 g/dL (ref 30.0–36.0)
MCV: 91.8 fL (ref 80.0–100.0)
Monocytes Absolute: 1.4 10*3/uL — ABNORMAL HIGH (ref 0.1–1.0)
Monocytes Relative: 8 %
Neutro Abs: 14.3 10*3/uL — ABNORMAL HIGH (ref 1.7–7.7)
Neutrophils Relative %: 77 %
Platelets: 674 10*3/uL — ABNORMAL HIGH (ref 150–400)
RBC: 3.92 MIL/uL — ABNORMAL LOW (ref 4.22–5.81)
RDW: 13.6 % (ref 11.5–15.5)
WBC: 18.5 10*3/uL — ABNORMAL HIGH (ref 4.0–10.5)
nRBC: 1.1 % — ABNORMAL HIGH (ref 0.0–0.2)

## 2021-03-01 LAB — CULTURE, BLOOD (ROUTINE X 2)
Culture: NO GROWTH
Culture: NO GROWTH
Special Requests: ADEQUATE
Special Requests: ADEQUATE

## 2021-03-01 LAB — CYCLIC CITRUL PEPTIDE ANTIBODY, IGG/IGA: CCP Antibodies IgG/IgA: 2 units (ref 0–19)

## 2021-03-01 LAB — ANTI-SMOOTH MUSCLE ANTIBODY, IGG: F-Actin IgG: 12 Units (ref 0–19)

## 2021-03-01 LAB — MPO/PR-3 (ANCA) ANTIBODIES
ANCA Proteinase 3: <3.5 U/mL (ref 0.0–3.5)
Myeloperoxidase Abs: <9 U/mL (ref 0.0–9.0)

## 2021-03-01 LAB — IGG 4: IgG, Subclass 4: 54 mg/dL (ref 2–96)

## 2021-03-01 LAB — SEDIMENTATION RATE: Sed Rate: 131 mm/hr — ABNORMAL HIGH (ref 0–16)

## 2021-03-01 MED ORDER — PANTOPRAZOLE SODIUM 40 MG PO TBEC
40.0000 mg | DELAYED_RELEASE_TABLET | Freq: Every day | ORAL | Status: DC
  Administered 2021-03-01 – 2021-03-02 (×2): 40 mg via ORAL
  Filled 2021-03-01 (×2): qty 1

## 2021-03-01 MED ORDER — AMLODIPINE BESYLATE 5 MG PO TABS: 10.0000 mg | ORAL_TABLET | Freq: Every day | ORAL | 2 refills | Status: DC

## 2021-03-01 MED ORDER — PREDNISONE 20 MG PO TABS: 60.0000 mg | ORAL_TABLET | Freq: Every day | ORAL | Status: DC

## 2021-03-01 MED ORDER — PREDNISONE 20 MG PO TABS
60.0000 mg | ORAL_TABLET | Freq: Every day | ORAL | Status: DC
  Administered 2021-03-02: 60 mg via ORAL
  Filled 2021-03-01: qty 3

## 2021-03-01 NOTE — Discharge Summary (Signed)
Physician Discharge Summary  Curtis Marquez BEE:[id number] DOB: Jun 25, 1946 DOA: 02/24/2021  PCP: Monico Blitz, MD  Admit date: 02/24/2021 Discharge date: 03/01/2021  Admitted From:Home Disposition:  [facility]    Discharge Condition: Stable CODE STATUS: FULL Diet recommendation: Heart Healthy   Brief/Interim Summary: Curtis Cainis a 75 y.o.malewith medical history significant forhypertension, glaucoma, s/p brain tumor excision (1994), prostate cancer s/p robotic assisted laparoscopic radical prostatectomy (January 2015) who presents to the emergency department via EMS due to generalized weakness, fever and confusion.Per report, patient has had progressive weakness and confusion with poor appetite since last visit to the ED (5/2). 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 He was started on steroid eye drops. However, he continued to have eye pain and decreased vision. He represented with fatigue and general malaise,fevers and eyepain on 02/24/21. Imaging is notable for enteritis, aortitis,and possible vasculitis. Admitted for further workup.He was started on empiric antibiotics and IV steroids.  He improved clinically.  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--GCA, Takayasu's, IgG4 or Behcet's -RPR negative -continueempiriczosyn fornow>>discontinue 03/01/10 -Recurrent fevers, leukocytosis -> meets criteria for SIRS -CT with prominent wall thickening involving distal small bowel in the pelvis with associated inflammatory changes - c/w enteritis of infectious, inflammatory  or ischemic etiology - also with abnormal appearance of aortic arch and proximal descending thoracic aorta which demonstrates indistinct mural thickening and surrounding inflammatory change, suspicious for aortitis/vasculitis -ESR 120, CRP 21.8 -Procal 0.81>>0.51 -autoimmune labs - ANCA titers, MPO/PR3, anti CCP, dsDNA, Anti-Sm, SS-A, SS-B--ALL NEG -check IgG4--NEG -ANA--NEG -Anti-Sm, anti-DNA, anti-SSA and anti-SSB---neg -C3--200 and C4 27 -RF--23.9 -QuantiFERON gold--NEG -acuteviralhepatitis panel--neg -Discussed with vascular, recommended discussion with ID or rheum--reviewed CT--no dissection noted -No rheum in house, discussed with [facility] rheum.  -Blood cultures--neg to date -Discussed case with ID recommended repeating blood cultures. Consider d/c abx if cx negative at 48 hrs. Carotid dopplers without evidence of vasculitis (moderate bilateral atherosclerotic plaque, no hemodynamically significant stenosis) -Of note, discussed with his ophthalmology office who note his exam was c/w uveitis (increased IOP thought 2/2 to this). His presentation with eye pain, vision loss, concerning for GCA in this presentation, though uveitis doesn't appear to be common finding with this. No jaw claudication, discomfort with palpation. 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 Peter Minium at [facility]-->patient is now on wait list for transfer to [place] -02/28/21--called [facility]--pt remains on wait list; I gave update on clinical info -if patient discharges--continue prednisone 60 mg daily until he f/u with Rheumatology  Generalized Weakness  Altered Mental Status -No focal neuro deficits.  -remainsA&Ox~2 today when I spoke to him. -Suspect this is 2/2 above. -Head CT with chronic small vessel ischemic changes. -5/8--seems a  little better, but clearly not at baseline -5/9--mental status--slowly improving -5/10-mental status continues to improve, near baseline  Essential Hypertension -increase amlodipine to 10 mg daily -start hydralazine 25 mg tid  Elevated LFT's -acute hepatitis panel -RUQ ultrasound--neg -suspect a degree of autoimmune hepatitis vs autoimmune cholangitis -coags unremarkable -largely stable -continue to trend  Acute Kidney Injury -Baseline0.8-1.0 -1.61 at presentation, improving, continue IVF>>1.18  Glaucoma  Uveitis -Continue eye drops -eye pain and visual acuity gradually improving -02/28/21-temporal artery biopsy--results pending    Discharge Instructions   Allergies as of 03/01/2021   No Known Allergies     Medication List    STOP taking these medications   acetaZOLAMIDE 250 MG tablet Commonly known as: DIAMOX   amoxicillin 500 MG tablet Commonly known as: AMOXIL   buPROPion 100 MG 12 hr tablet Commonly known as: WELLBUTRIN SR   clarithromycin 500 MG tablet Commonly known as: Biaxin   clonazePAM 0.5 MG tablet Commonly known as: KLONOPIN   cloNIDine 0.1 MG tablet Commonly known as: CATAPRES   cloNIDine 0.3 MG tablet Commonly known as: CATAPRES   losartan 100 MG tablet Commonly known as: COZAAR   meloxicam 15 MG tablet Commonly known as: MOBIC   olmesartan 40 MG tablet Commonly known as: BENICAR   omeprazole 20 MG capsule Commonly known as: PRILOSEC     TAKE these medications   amLODipine 5 MG tablet Commonly known as: NORVASC Take 2 tablets (10 mg total) by mouth daily. Start taking on: Mar 02, 2021 What changed: how much to take   brimonidine 0.2 % ophthalmic solution Commonly known as: ALPHAGAN Place 1 drop into the left eye in the morning and at bedtime.   hydrALAZINE 50 MG tablet Commonly known as: APRESOLINE Take 50 mg by mouth 2 (two) times a day.   levocetirizine 5 MG tablet Commonly known as: XYZAL Take 5 mg by mouth  every evening.   prednisoLONE acetate 1 % ophthalmic suspension Commonly known as: PRED FORTE Place 1 drop into the left eye 4 (four) times daily.   predniSONE 20 MG tablet Commonly known as: DELTASONE Take 3 tablets (60 mg total) by mouth daily with breakfast. Start taking on: Mar 02, 2021   timolol 0.5 % ophthalmic solution Commonly known as: BETIMOL Place 1 drop into the left eye 2 (two) times daily.       Follow-up Information    Virl Cagey, MD On 03/15/2021.   Specialty: General Surgery Why: post op phone call to check and make sure wound is healing without issues, call if you need to be seen in person  Contact information: [address] [facility] [address] [phone]        Lahoma Rocker, MD Follow up in 1 week(s).   Specialty: Rheumatology Contact information: [address] [place] [address] [phone]              No Known Allergies  Consultations:  none   Procedures/Studies: CT Head Wo Contrast  Result Date: 02/24/2021 CLINICAL DATA:  Mental status change. Weakness, loss of appetite and fever. EXAM: CT HEAD WITHOUT CONTRAST TECHNIQUE: Contiguous axial images were obtained from the base of the skull through the vertex without intravenous contrast. COMPARISON:  02/09/2016 FINDINGS: Brain: No evidence of acute infarction, hemorrhage, hydrocephalus, extra-axial collection or mass lesion/mass effect. There is mild diffuse low-attenuation within the subcortical and periventricular white matter compatible with chronic microvascular disease. Vascular: No hyperdense vessel or unexpected calcification. Skull: Previous right frontal craniotomy. Negative for fracture or focal lesion. Sinuses/Orbits: No acute finding. Other: None. IMPRESSION: 1. No acute intracranial abnormalities. 2. Chronic small vessel ischemic change and brain atrophy. Electronically Signed   By: Kerby Moors M.D.   On: 02/24/2021 18:54   CT CHEST ABDOMEN  PELVIS W CONTRAST  Result Date: 02/24/2021 CLINICAL DATA:  Abdomen pain fever and leukocytosis EXAM: CT CHEST, ABDOMEN, AND PELVIS WITH CONTRAST TECHNIQUE:  Multidetector CT imaging of the chest, abdomen and pelvis was performed following the standard protocol during bolus administration of intravenous contrast. CONTRAST:  67m OMNIPAQUE IOHEXOL 300 MG/ML  SOLN COMPARISON:  CT 02/09/2016 FINDINGS: CT CHEST FINDINGS Cardiovascular: Mild aortic atherosclerosis. Indistinct wall thickening at the great vessel origins aortic arch and proximal descending aorta. No dissection is seen. There is no aneurysmal formation. Mild aortic atherosclerosis. Cardiomegaly. Common origin of the left common carotid artery and brachiocephalic artery. Cardiomegaly. Trace pericardial effusion. Mediastinum/Nodes: Midline trachea. No thyroid mass. No suspicious adenopathy. Esophagus within normal limits Lungs/Pleura: No consolidation or pneumothorax. No significant pleural effusion Musculoskeletal: No acute or suspicious osseous abnormality. CT ABDOMEN PELVIS FINDINGS Hepatobiliary: Subcentimeter hypodensity within the anterior liver too small to further characterize. No calcified gallstone or biliary dilatation. Pancreas: Unremarkable. No pancreatic ductal dilatation or surrounding inflammatory changes. Spleen: Normal in size without focal abnormality. Adrenals/Urinary Tract: Adrenal glands within normal limits. Kidneys show no hydronephrosis. Cyst in the right kidney. The bladder is unremarkable Stomach/Bowel: The stomach is nonenlarged. Negative appendix. Wall thickening and inflammatory change involving distal small bowel in the pelvis concerning for enteritis. Vascular/Lymphatic: Nonaneurysmal aorta. Mild aortic atherosclerosis. Possible mild mural thickening of the abdominal aorta, external iliac vessels and common femoral vessels. No suspicious nodes. Reproductive: Status post prostatectomy. Other: Small free fluid in the pelvis.  No  free air. Musculoskeletal: No acute or suspicious osseous abnormality IMPRESSION: 1. Prominent wall thickening involving distal small bowel in the pelvis with associated inflammatory changes, consistent with enteritis of infectious, inflammatory, or ischemic etiology. 2. Abnormal appearance of the aortic arch and proximal descending thoracic aorta which demonstrates indistinct mural thickening and surrounding inflammatory change, suspicious for aortitis/vasculitis. This could be secondary to infectious etiologies as well as autoimmune disease. There is no aneurysmal dilatation at this time. 3. Small free fluid in the pelvis Electronically Signed   By: KDonavan FoilM.D.   On: 02/24/2021 23:31   UKoreaCarotid Bilateral  Result Date: 02/25/2021 CLINICAL DATA:  Vasculitis questioned on previous CT. History of hypertension and hyperlipidemia. Former smoker. EXAM: BILATERAL CAROTID DUPLEX ULTRASOUND TECHNIQUE: GPearline Cablesscale imaging, color Doppler and duplex ultrasound were performed of bilateral carotid and vertebral arteries in the neck. COMPARISON:  Carotid Doppler ultrasound-02/10/2016; CT the chest, abdomen and pelvis-02/24/2021 FINDINGS: Criteria: Quantification of carotid stenosis is based on velocity parameters that correlate the residual internal carotid diameter with NASCET-based stenosis levels, using the diameter of the distal internal carotid lumen as the denominator for stenosis measurement. The following velocity measurements were obtained: RIGHT ICA: 80/19 cm/sec CCA: 1606/30cm/sec SYSTOLIC ICA/CCA RATIO:  0.6 ECA: 104 cm/sec LEFT ICA: 91/24 cm/sec CCA: 916/01cm/sec SYSTOLIC ICA/CCA RATIO:  0.9 ECA: 110 cm/sec RIGHT CAROTID ARTERY: There is mild tortuosity involving the right common carotid artery (images 4 and 8). There is a moderate amount of eccentric mixed echogenic plaque within the right carotid bulb (image 14 and 16), extending to involve the origin and proximal aspects of the right internal carotid  artery (image 24), progressed compared to the 2017 examination though not resulting in elevated peak systolic velocities within the interrogated course of the right internal carotid artery to suggest a hemodynamically significant stenosis. No discrete areas of vessel irregularity or abnormal wall thickening. RIGHT VERTEBRAL ARTERY:  Antegrade flow LEFT CAROTID ARTERY: There is a moderate amount of eccentric hypoechoic plaque involving the distal aspect the left common carotid artery (image 44 and 45). There is a moderate amount of eccentric echogenic partially shadowing plaque within  the left carotid bulb (image 50), extending to involve the origin and proximal aspects of the left internal carotid artery (image 58), unchanged to slightly progressed compared to the 2017 examination though not resulting in elevated peak systolic velocities within the interrogated course of the left internal carotid artery to suggest a hemodynamically significant stenosis. No discrete areas of vessel irregularity or thickening. LEFT VERTEBRAL ARTERY:  Antegrade flow IMPRESSION: 1. Moderate amount of bilateral atherosclerotic plaque, unchanged to slightly progressed compared to remote examination performed in 2017, though not resulting in a hemodynamically significant stenosis within either internal carotid artery. 2. No sonographic evidence of vessel wall irregularity or thickening to suggest a vasculitis affecting the carotid arteries. Further evaluation with CTA could be performed as indicated. Electronically Signed   By: Sandi Mariscal M.D.   On: 02/25/2021 16:03   DG Chest Port 1 View  Result Date: 02/24/2021 CLINICAL DATA:  Sepsis EXAM: PORTABLE CHEST 1 VIEW COMPARISON:  11/28/2018 FINDINGS: The heart size and mediastinal contours are within normal limits. Both lungs are clear. The visualized skeletal structures are unremarkable. IMPRESSION: No active disease. Electronically Signed   By: Fidela Salisbury MD   On: 02/24/2021 18:04    US Abdomen Limited RUQ (LIVER/GB)  Result Date: 02/26/2021 CLINICAL DATA:  Elevated LFTs EXAM: ULTRASOUND ABDOMEN LIMITED RIGHT UPPER QUADRANT COMPARISON:  None. FINDINGS: Gallbladder: No gallstones or wall thickening visualized. No sonographic Curtis sign noted by sonographer. Common bile duct: Diameter: Normal at 6 mm Liver: No focal lesion identified. Within normal limits in parenchymal echogenicity. Portal vein is patent on color Doppler imaging with normal direction of blood flow towards the liver. Other: None. IMPRESSION: Unremarkable right upper quadrant ultrasound. Electronically Signed   By: Jacqulynn Cadet M.D.   On: 02/26/2021 13:40        Discharge Exam: Vitals:   03/01/21 1026 03/01/21 1511  BP: (!) 160/81 135/73  Pulse:    Resp:    Temp:    SpO2:     Vitals:   02/28/21 2210 03/01/21 0455 03/01/21 1026 03/01/21 1511  BP: (!) 148/71 (!) 188/88 (!) 160/81 135/73  Pulse: 63 72    Resp: 20 16    Temp: 99.4 F (37.4 C) 98.4 F (36.9 C)    TempSrc: Oral Oral    SpO2: 94% 98%    Weight:      Height:        General: Pt is alert, awake, not in acute distress Cardiovascular: RRR, S1/S2 +, no rubs, no gallops Respiratory: CTA bilaterally, no wheezing, no rhonchi Abdominal: Soft, NT, ND, bowel sounds + Extremities: no edema, no cyanosis   The results of significant diagnostics from this hospitalization (including imaging, microbiology, ancillary and laboratory) are listed below for reference.    Significant Diagnostic Studies: CT Head Wo Contrast  Result Date: 02/24/2021 CLINICAL DATA:  Mental status change. Weakness, loss of appetite and fever. EXAM: CT HEAD WITHOUT CONTRAST TECHNIQUE: Contiguous axial images were obtained from the base of the skull through the vertex without intravenous contrast. COMPARISON:  02/09/2016 FINDINGS: Brain: No evidence of acute infarction, hemorrhage, hydrocephalus, extra-axial collection or mass lesion/mass effect. There is mild  diffuse low-attenuation within the subcortical and periventricular white matter compatible with chronic microvascular disease. Vascular: No hyperdense vessel or unexpected calcification. Skull: Previous right frontal craniotomy. Negative for fracture or focal lesion. Sinuses/Orbits: No acute finding. Other: None. IMPRESSION: 1. No acute intracranial abnormalities. 2. Chronic small vessel ischemic change and brain atrophy. Electronically Signed   By:  Kerby Moors M.D.   On: 02/24/2021 18:54   CT CHEST ABDOMEN PELVIS W CONTRAST  Result Date: 02/24/2021 CLINICAL DATA:  Abdomen pain fever and leukocytosis EXAM: CT CHEST, ABDOMEN, AND PELVIS WITH CONTRAST TECHNIQUE: Multidetector CT imaging of the chest, abdomen and pelvis was performed following the standard protocol during bolus administration of intravenous contrast. CONTRAST:  49m OMNIPAQUE IOHEXOL 300 MG/ML  SOLN COMPARISON:  CT 02/09/2016 FINDINGS: CT CHEST FINDINGS Cardiovascular: Mild aortic atherosclerosis. Indistinct wall thickening at the great vessel origins aortic arch and proximal descending aorta. No dissection is seen. There is no aneurysmal formation. Mild aortic atherosclerosis. Cardiomegaly. Common origin of the left common carotid artery and brachiocephalic artery. Cardiomegaly. Trace pericardial effusion. Mediastinum/Nodes: Midline trachea. No thyroid mass. No suspicious adenopathy. Esophagus within normal limits Lungs/Pleura: No consolidation or pneumothorax. No significant pleural effusion Musculoskeletal: No acute or suspicious osseous abnormality. CT ABDOMEN PELVIS FINDINGS Hepatobiliary: Subcentimeter hypodensity within the anterior liver too small to further characterize. No calcified gallstone or biliary dilatation. Pancreas: Unremarkable. No pancreatic ductal dilatation or surrounding inflammatory changes. Spleen: Normal in size without focal abnormality. Adrenals/Urinary Tract: Adrenal glands within normal limits. Kidneys show no  hydronephrosis. Cyst in the right kidney. The bladder is unremarkable Stomach/Bowel: The stomach is nonenlarged. Negative appendix. Wall thickening and inflammatory change involving distal small bowel in the pelvis concerning for enteritis. Vascular/Lymphatic: Nonaneurysmal aorta. Mild aortic atherosclerosis. Possible mild mural thickening of the abdominal aorta, external iliac vessels and common femoral vessels. No suspicious nodes. Reproductive: Status post prostatectomy. Other: Small free fluid in the pelvis.  No free air. Musculoskeletal: No acute or suspicious osseous abnormality IMPRESSION: 1. Prominent wall thickening involving distal small bowel in the pelvis with associated inflammatory changes, consistent with enteritis of infectious, inflammatory, or ischemic etiology. 2. Abnormal appearance of the aortic arch and proximal descending thoracic aorta which demonstrates indistinct mural thickening and surrounding inflammatory change, suspicious for aortitis/vasculitis. This could be secondary to infectious etiologies as well as autoimmune disease. There is no aneurysmal dilatation at this time. 3. Small free fluid in the pelvis Electronically Signed   By: KDonavan FoilM.D.   On: 02/24/2021 23:31   [place]Carotid Bilateral  Result Date: 02/25/2021 CLINICAL DATA:  Vasculitis questioned on previous CT. History of hypertension and hyperlipidemia. Former smoker. EXAM: BILATERAL CAROTID DUPLEX ULTRASOUND TECHNIQUE: GPearline Cablesscale imaging, color Doppler and duplex ultrasound were performed of bilateral carotid and vertebral arteries in the neck. COMPARISON:  Carotid Doppler ultrasound-02/10/2016; CT the chest, abdomen and pelvis-02/24/2021 FINDINGS: Criteria: Quantification of carotid stenosis is based on velocity parameters that correlate the residual internal carotid diameter with NASCET-based stenosis levels, using the diameter of the distal internal carotid lumen as the denominator for stenosis measurement. The  following velocity measurements were obtained: RIGHT ICA: 80/19 cm/sec CCA: 1161/09cm/sec SYSTOLIC ICA/CCA RATIO:  0.6 ECA: 104 cm/sec LEFT ICA: 91/24 cm/sec CCA: 960/45cm/sec SYSTOLIC ICA/CCA RATIO:  0.9 ECA: 110 cm/sec RIGHT CAROTID ARTERY: There is mild tortuosity involving the right common carotid artery (images 4 and 8). There is a moderate amount of eccentric mixed echogenic plaque within the right carotid bulb (image 14 and 16), extending to involve the origin and proximal aspects of the right internal carotid artery (image 24), progressed compared to the 2017 examination though not resulting in elevated peak systolic velocities within the interrogated course of the right internal carotid artery to suggest a hemodynamically significant stenosis. No discrete areas of vessel irregularity or abnormal wall thickening. RIGHT VERTEBRAL ARTERY:  Antegrade  flow LEFT CAROTID ARTERY: There is a moderate amount of eccentric hypoechoic plaque involving the distal aspect the left common carotid artery (image 44 and 45). There is a moderate amount of eccentric echogenic partially shadowing plaque within the left carotid bulb (image 50), extending to involve the origin and proximal aspects of the left internal carotid artery (image 58), unchanged to slightly progressed compared to the 2017 examination though not resulting in elevated peak systolic velocities within the interrogated course of the left internal carotid artery to suggest a hemodynamically significant stenosis. No discrete areas of vessel irregularity or thickening. LEFT VERTEBRAL ARTERY:  Antegrade flow IMPRESSION: 1. Moderate amount of bilateral atherosclerotic plaque, unchanged to slightly progressed compared to remote examination performed in 2017, though not resulting in a hemodynamically significant stenosis within either internal carotid artery. 2. No sonographic evidence of vessel wall irregularity or thickening to suggest a vasculitis affecting the  carotid arteries. Further evaluation with CTA could be performed as indicated. Electronically Signed   By: Sandi Mariscal M.D.   On: 02/25/2021 16:03   DG Chest Port 1 View  Result Date: 02/24/2021 CLINICAL DATA:  Sepsis EXAM: PORTABLE CHEST 1 VIEW COMPARISON:  11/28/2018 FINDINGS: The heart size and mediastinal contours are within normal limits. Both lungs are clear. The visualized skeletal structures are unremarkable. IMPRESSION: No active disease. Electronically Signed   By: Fidela Salisbury MD   On: 02/24/2021 18:04   US Abdomen Limited RUQ (LIVER/GB)  Result Date: 02/26/2021 CLINICAL DATA:  Elevated LFTs EXAM: ULTRASOUND ABDOMEN LIMITED RIGHT UPPER QUADRANT COMPARISON:  None. FINDINGS: Gallbladder: No gallstones or wall thickening visualized. No sonographic Curtis sign noted by sonographer. Common bile duct: Diameter: Normal at 6 mm Liver: No focal lesion identified. Within normal limits in parenchymal echogenicity. Portal vein is patent on color Doppler imaging with normal direction of blood flow towards the liver. Other: None. IMPRESSION: Unremarkable right upper quadrant ultrasound. Electronically Signed   By: Jacqulynn Cadet M.D.   On: 02/26/2021 13:40     Microbiology: Recent Results (from the past 240 hour(s))  Culture, blood (routine x 2)     Status: None   Collection Time: 02/21/21  7:30 PM   Specimen: BLOOD  Result Value Ref Range Status   Specimen Description BLOOD RIGHT ANTECUBITAL  Final   Special Requests   Final    BOTTLES DRAWN AEROBIC AND ANAEROBIC Blood Culture adequate volume   Culture   Final    NO GROWTH 5 DAYS Performed at [facility], [address] [place], [place] [address]    Report Status 02/26/2021 FINAL  Final  Culture, blood (routine x 2)     Status: None   Collection Time: 02/21/21  7:30 PM   Specimen: BLOOD RIGHT FOREARM  Result Value Ref Range Status   Specimen Description BLOOD RIGHT FOREARM  Final   Special Requests   Final    BOTTLES DRAWN AEROBIC  AND ANAEROBIC Blood Culture adequate volume   Culture   Final    NO GROWTH 5 DAYS Performed at [facility], [address] [place], [place] [address]    Report Status 02/26/2021 FINAL  Final  Urine culture     Status: Abnormal   Collection Time: 02/21/21  9:49 PM   Specimen: Urine, Clean Catch  Result Value Ref Range Status   Specimen Description   Final    URINE, CLEAN CATCH Performed at [facility], [address], [place], [place] [address]    Special Requests   Final  NONE Performed at [facility], [address] [place], [place] [address]    Culture (A)  Final    10,000 COLONIES/mL AEROCOCCUS URINAE Standardized susceptibility testing for this organism is not available. Performed at [facility], [place] [address], [place], [place] [place] [id number]    Report Status 02/25/2021 FINAL  Final  SARS CORONAVIRUS 2 (Timmy Bubeck 6-24 HRS) Nasopharyngeal Nasopharyngeal Swab     Status: None   Collection Time: 02/24/21  5:32 PM   Specimen: Nasopharyngeal Swab  Result Value Ref Range Status   SARS Coronavirus 2 NEGATIVE NEGATIVE Final    Comment: (NOTE) SARS-CoV-2 target nucleic acids are NOT DETECTED.  The SARS-CoV-2 RNA is generally detectable in upper and lower respiratory specimens during the acute phase of infection. Negative results do not preclude SARS-CoV-2 infection, do not rule out co-infections with other pathogens, and should not be used as the sole basis for treatment or other patient management decisions. Negative results must be combined with clinical observations, patient history, and epidemiological information. The expected result is Negative.  Fact Sheet for Patients: SugarRoll.be  Fact Sheet for Healthcare Providers: https://www.woods-mathews.com/  This test is not yet approved or cleared by the [place] FDA and  has been authorized for detection and/or diagnosis of SARS-CoV-2 by FDA under an Emergency Use  Authorization (EUA). This EUA will remain  in effect (meaning this test can be used) for the duration of the COVID-19 declaration under Se ction 564(b)(1) of the Act, 21 U.S.C. section 360bbb-3(b)(1), unless the authorization is terminated or revoked sooner.  Performed at [facility], [place] [address] [place], [place] [address]   Blood Culture (routine x 2)     Status: None   Collection Time: 02/24/21  5:45 PM   Specimen: BLOOD LEFT ARM  Result Value Ref Range Status   Specimen Description BLOOD LEFT ARM  Final   Special Requests   Final    BOTTLES DRAWN AEROBIC AND ANAEROBIC Blood Culture adequate volume   Culture   Final    NO GROWTH 5 DAYS Performed at [facility], [address] [place], [place] [address]    Report Status 03/01/2021 FINAL  Final  Blood Culture (routine x 2)     Status: None   Collection Time: 02/24/21  5:45 PM   Specimen: BLOOD RIGHT ARM  Result Value Ref Range Status   Specimen Description BLOOD RIGHT ARM  Final   Special Requests   Final    BOTTLES DRAWN AEROBIC AND ANAEROBIC Blood Culture adequate volume   Culture   Final    NO GROWTH 5 DAYS Performed at [facility], [address] [place], [place] [address]    Report Status 03/01/2021 FINAL  Final  Resp Panel by RT-PCR (Flu A&B, Covid) Nasopharyngeal Swab     Status: None   Collection Time: 02/24/21  9:06 PM   Specimen: Nasopharyngeal Swab; Nasopharyngeal(NP) swabs in vial transport medium  Result Value Ref Range Status   SARS Coronavirus 2 by RT PCR NEGATIVE NEGATIVE Final    Comment: (NOTE) SARS-CoV-2 target nucleic acids are NOT DETECTED.  The SARS-CoV-2 RNA is generally detectable in upper respiratory specimens during the acute phase of infection. The lowest concentration of SARS-CoV-2 viral copies this assay can detect is 138 copies/mL. A negative result does not preclude SARS-Cov-2 infection and should not be used as the sole basis for treatment or other patient management  decisions. A negative result may occur with  improper specimen collection/handling, submission of specimen  other than nasopharyngeal swab, presence of viral mutation(s) within the areas targeted by this assay, and inadequate number of viral copies(<138 copies/mL). A negative result must be combined with clinical observations, patient history, and epidemiological information. The expected result is Negative.  Fact Sheet for Patients:  EntrepreneurPulse.com.au  Fact Sheet for Healthcare Providers:  IncredibleEmployment.be  This test is no t yet approved or cleared by the [place] FDA and  has been authorized for detection and/or diagnosis of SARS-CoV-2 by FDA under an Emergency Use Authorization (EUA). This EUA will remain  in effect (meaning this test can be used) for the duration of the COVID-19 declaration under Section 564(b)(1) of the Act, 21 U.S.C.section 360bbb-3(b)(1), unless the authorization is terminated  or revoked sooner.       Influenza A by PCR NEGATIVE NEGATIVE Final   Influenza B by PCR NEGATIVE NEGATIVE Final    Comment: (NOTE) The Xpert Xpress SARS-CoV-2/FLU/RSV plus assay is intended as an aid in the diagnosis of influenza from Nasopharyngeal swab specimens and should not be used as a sole basis for treatment. Nasal washings and aspirates are unacceptable for Xpert Xpress SARS-CoV-2/FLU/RSV testing.  Fact Sheet for Patients: EntrepreneurPulse.com.au  Fact Sheet for Healthcare Providers: IncredibleEmployment.be  This test is not yet approved or cleared by the [place] FDA and has been authorized for detection and/or diagnosis of SARS-CoV-2 by FDA under an Emergency Use Authorization (EUA). This EUA will remain in effect (meaning this test can be used) for the duration of the COVID-19 declaration under Section 564(b)(1) of the Act, 21 U.S.C. section 360bbb-3(b)(1), unless the  authorization is terminated or revoked.  Performed at [facility], [address], [place], [place] [address]   Urine culture     Status: None   Collection Time: 02/25/21  1:18 AM   Specimen: In/Out Cath Urine  Result Value Ref Range Status   Specimen Description   Final    IN/OUT CATH URINE Performed at [facility], [address], [place], [place] [address]    Special Requests   Final    NONE Performed at [facility], [address] [place], [place] [address]    Culture   Final    NO GROWTH Performed at [facility], [place] [address], [place], [place] [address]    Report Status 02/26/2021 FINAL  Final  Culture, blood (routine x 2)     Status: None (Preliminary result)   Collection Time: 02/25/21  5:03 PM   Specimen: Right Antecubital; Blood  Result Value Ref Range Status   Specimen Description RIGHT ANTECUBITAL  Final   Special Requests   Final    Blood Culture adequate volume BOTTLES DRAWN AEROBIC AND ANAEROBIC   Culture   Final    NO GROWTH 4 DAYS Performed at [facility], [address], [place], [place] [address]    Report Status PENDING  Incomplete  Culture, blood (routine x 2)     Status: None (Preliminary result)   Collection Time: 02/25/21  5:03 PM   Specimen: BLOOD RIGHT ARM  Result Value Ref Range Status   Specimen Description BLOOD RIGHT ARM  Final   Special Requests   Final    Blood Culture results may not be optimal due to an inadequate volume of blood received in culture bottles BOTTLES DRAWN AEROBIC ONLY   Culture   Final    NO GROWTH 4 DAYS Performed at [facility], [address] [place],  [address]    Report Status PENDING  Incomplete     Labs: Basic Metabolic Panel: Recent Labs  Lab 02/24/21 1745 02/25/21 0156 02/25/21 0445 02/26/21 0629 02/27/21 0607 03/01/21 0615  NA 139  --  138 139 141 141  K 3.6  --  3.2* 4.1 4.5 3.7  CL 106  --  107 108 106 110  CO2 23  --  _0 GLUCOSE 110*  --  125* 140* 133* 110*  BUN  35*  --  30* 31* 33* 17  CREATININE 1.61* 1.40* 1.34* 1.29* 1.18 0.95  CALCIUM 8.6*  --  7.9* 8.3* 8.4* 7.9*  MG  --   --  2.4 2.5*  --   --   PHOS  --   --  3.9 4.0  --   --    Liver Function Tests: Recent Labs  Lab 02/24/21 1745 02/25/21 0445 02/26/21 0629 02/27/21 0607 03/01/21 0615  AST 95* 98* 63* 69* 50*  ALT 126* 127* 129* 145* 178*  ALKPHOS 253* 250* 245* 258* 246*  BILITOT 1.0 1.3* 0.6 0.7 0.7  PROT 7.3 6.2* 6.2* 6.7 6.0*  ALBUMIN 2.4* 2.1* 1.9* 2.1* 2.1*   No results for input(s): LIPASE, AMYLASE in the last 168 hours. No results for input(s): AMMONIA in the last 168 hours. CBC: Recent Labs  Lab 02/24/21 1745 02/25/21 0156 02/25/21 0445 02/26/21 0629 02/27/21 0607 03/01/21 0615  WBC 15.7* 17.2* 17.3* 16.9* 19.7* 18.5*  NEUTROABS 13.2*  --   --  15.0*  --  14.3*  HGB 12.1* 10.4* 10.2* 10.1* 10.9* 11.2*  HCT 37.7* 32.6* 32.0* 31.6* 34.8* 36.0*  MCV 90.6 90.6 90.4 90.8 91.3 91.8  PLT 641* 509* 494* 551* 634* 674*   Cardiac Enzymes: No results for input(s): CKTOTAL, CKMB, CKMBINDEX, TROPONINI in the last 168 hours. BNP: Invalid input(s): POCBNP CBG: Recent Labs  Lab 02/26/21 2217 02/27/21 2157  GLUCAP 193* 110*    Time coordinating discharge:  36 minutes  Signed:  Orson Eva, DO [facility] Pager: [phone] 03/01/2021, 6:42 PM

## 2021-03-01 NOTE — Progress Notes (Signed)
Pt had transfer orders to [facility] .  When I found out that transportation to [place] was on their way I went to update pt.Marland Kitchen   He informed me at that time that he was not going with no reason given..  I went to inform the charge nurse B Wynetta Emery RN who stated that he had just told her the same thing,,  Charge nurse and myself went into pt room , asked him if he was going and he stated with both of [place] present no that he would just stay here.   , was not going.   MD on call notified of above information

## 2021-03-01 NOTE — Progress Notes (Addendum)
Went to inform patient that [facility] transport would be here in a few moments to pick up patient.  Patient adamently stated he would not be going to [facility], he had been waiting for a bed and he would just rather not go.Paitent is alert and oriented and aware of his medical situation and issues.   Patient stated he would be staying here.  Primary nurse notified about patient refusal of going.  Primary nurse to notify MD on call of patient refusal of being transferred.

## 2021-03-01 NOTE — Progress Notes (Signed)
PROGRESS NOTE  Curtis Marquez BTD:[id number] DOB: [birth year] DOA: 02/24/2021 PCP: Monico Blitz, MD  Brief History: Curtis Cainis a 75 y.o.malewith medical history significant forhypertension, glaucoma, s/p brain tumor excision (1994), prostate cancer s/p robotic assisted laparoscopic radical prostatectomy (January 2015) who presents to the emergency department via EMS due to generalized weakness, fever and confusion.Per report, patient has had progressive weakness and confusion with poor appetite since last visit to the ED (5/2). 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 He was started on steroid eye drops. However, he continued to have eye pain and decreased vision. He represented with fatigue and general malaise,fevers and eyepain on 02/24/21. Imaging is notable for enteritis, aortitis,and possible vasculitis. Admitted for further workup.He was started on empiric antibiotics and IV steroids.  He improved clinically.  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--GCA, Takayasu's, IgG4 or Behcet's -RPR negative -continueempiriczosyn fornow>>discontinue 03/01/10 -Recurrent fevers, leukocytosis -> meets criteria for SIRS -CT with prominent wall thickening involving distal small bowel in the pelvis with associated inflammatory changes - c/w enteritis of infectious, inflammatory or ischemic etiology - also with abnormal appearance of aortic arch and proximal descending thoracic aorta which demonstrates indistinct mural thickening and surrounding inflammatory change, suspicious for  aortitis/vasculitis -ESR 120, CRP 21.8 -Procal 0.81>>0.51 -autoimmune labs - ANCA titers, MPO/PR3, anti CCP, dsDNA, Anti-Sm, SS-A, SS-B--ALL NEG -check IgG4--NEG -ANA--NEG -Anti-Sm, anti-DNA, anti-SSA and anti-SSB---neg -C3--200 and C4 27 -RF--23.9 -QuantiFERON gold --NEG -acuteviralhepatitis panel--neg -Discussed with vascular, recommended discussion with ID or rheum--reviewed CT--no dissection noted -No rheum in house, discussed with [facility] rheum.  -Blood cultures--neg to date -Discussed case with ID recommended repeating blood cultures. Consider d/c abx if cx negative at 48 hrs. Carotid dopplers without evidence of vasculitis (moderate bilateral atherosclerotic plaque, no hemodynamically significant stenosis) -Of note, discussed with his ophthalmology office who note his exam was c/w uveitis (increased IOP thought 2/2 to this). His presentation with eye pain, vision loss, concerning for GCA in this presentation, though uveitis doesn't appear to be common finding with this. No jaw claudication, discomfort with palpation. 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 Peter Minium at [facility]-->patient is now on wait list for transfer to [place] -02/28/21--called [facility]--pt remains on wait list; I gave update on clinical info -if patient discharges--continue prednisone 60 mg daily until he f/u with Rheumatology  Generalized Weakness  Altered Mental Status -No focal neuro deficits.  -remainsA&Ox~2 today when I spoke to him. -Suspect this is 2/2 above. -Head CT with chronic small vessel ischemic changes. -5/8--seems a little better, but clearly not at baseline -5/9--mental status--slowly improving -5/10-mental status continues to improve, near baseline  Essential Hypertension -increase amlodipine to 10 mg  daily -start hydralazine 25 mg tid  Elevated LFT's -acute hepatitis panel -RUQ ultrasound--neg -suspect a degree of autoimmune hepatitis vs autoimmune cholangitis -coags unremarkable -largely stable -continue to trend  Acute Kidney Injury -Baseline0.8-1.0 -1.61 at presentation, improving, continue IVF>>1.18  Glaucoma  Uveitis -Continue eye drops -eye pain and visual acuity gradually improving -02/28/21-temporal artery biopsy--results pending      Status is: Inpatient  Remains inpatient appropriate because:Inpatient level of care appropriate due to severity of illness   Dispo: The patient is from:Home Anticipated d/c is RX:YVOP Patient currently is not medically stable to d/c. Difficult to place patient No        Family Communication:Daughter updated 5/10  Consultants:General surgery; GI  Code Status: FULL   DVT Prophylaxis: [place] Heparin    Procedures: As Listed in Progress Note Above  Antibiotics: Zosyn 5/6>>5/9     Subjective: Patient denies fevers, chills, headache, chest pain, dyspnea, nausea, vomiting, diarrhea, abdominal pain, dysuria, hematuria, hematochezia, and melena. Eye pain and visual acuity are improving  Objective: Vitals:   02/28/21 2210 03/01/21 0455 03/01/21 1026 03/01/21 1511  BP: (!) 148/71 (!) 188/88 (!) 160/81 135/73  Pulse: 63 72    Resp: 20 16    Temp: 99.4 F (37.4 C) 98.4 F (36.9 C)    TempSrc: Oral Oral    SpO2: 94% 98%    Weight:      Height:        Intake/Output Summary (Last 24 hours) at 03/01/2021 1611 Last data filed at 03/01/2021 0935 Gross per 24 hour  Intake 720 ml  Output 575 ml  Net 145 ml   Weight change:  Exam:   General:  Pt is alert, follows commands appropriately, not in acute distress  HEENT: No icterus, No thrush, No neck mass, [place]/AT  Cardiovascular: RRR, S1/S2, no rubs, no gallops  Respiratory: CTA bilaterally,  no wheezing, no crackles, no rhonchi  Abdomen: Soft/+BS, non tender, non distended, no guarding  Extremities: No edema, No lymphangitis, No petechiae, No rashes, no synovitis   Data Reviewed: I have personally reviewed following labs and imaging studies Basic Metabolic Panel: Recent Labs  Lab 02/24/21 1745 02/25/21 0156 02/25/21 0445 02/26/21 0629 02/27/21 0607 03/01/21 0615  NA 139  --  138 139 141 141  K 3.6  --  3.2* 4.1 4.5 3.7  CL 106  --  107 108 106 110  CO2 23  --  _0 GLUCOSE 110*  --  125* 140* 133* 110*  BUN 35*  --  30* 31* 33* 17  CREATININE 1.61* 1.40* 1.34* 1.29* 1.18 0.95  CALCIUM 8.6*  --  7.9* 8.3* 8.4* 7.9*  MG  --   --  2.4 2.5*  --   --   PHOS  --   --  3.9 4.0  --   --    Liver Function Tests: Recent Labs  Lab 02/24/21 1745 02/25/21 0445 02/26/21 0629 02/27/21 0607 03/01/21 0615  AST 95* 98* 63* 69* 50*  ALT 126* 127* 129* 145* 178*  ALKPHOS 253* 250* 245* 258* 246*  BILITOT 1.0 1.3* 0.6 0.7 0.7  PROT 7.3 6.2* 6.2* 6.7 6.0*  ALBUMIN 2.4* 2.1* 1.9* 2.1* 2.1*   No results for input(s): LIPASE, AMYLASE in the last 168 hours. No results for input(s): AMMONIA in the last 168 hours. Coagulation Profile: Recent Labs  Lab 02/24/21 1745 02/25/21 0445  INR 1.2 1.3*   CBC: Recent Labs  Lab 02/24/21 1745 02/25/21 0156 02/25/21 0445 02/26/21 0629 02/27/21 0607 03/01/21 0615  WBC 15.7* 17.2* 17.3* 16.9* 19.7* 18.5*  NEUTROABS 13.2*  --   --  15.0*  --  14.3*  HGB 12.1* 10.4* 10.2* 10.1* 10.9* 11.2*  HCT 37.7* 32.6* 32.0* 31.6* 34.8* 36.0*  MCV 90.6 90.6 90.4 90.8 91.3 91.8  PLT 641* 509* 494* 551* 634* 674*   Cardiac Enzymes: No results for input(s):  CKTOTAL, CKMB, CKMBINDEX, TROPONINI in the last 168 hours. BNP: Invalid input(s): POCBNP CBG: Recent Labs  Lab 02/26/21 2217 02/27/21 2157  GLUCAP 193* 110*   HbA1C: No results for input(s): HGBA1C in the last 72 hours. Urine analysis:    Component Value Date/Time    COLORURINE YELLOW 02/25/2021 0118   APPEARANCEUR CLEAR 02/25/2021 0118   LABSPEC >1.046 (H) 02/25/2021 0118   PHURINE 5.0 02/25/2021 0118   GLUCOSEU NEGATIVE 02/25/2021 0118   HGBUR NEGATIVE 02/25/2021 0118   BILIRUBINUR NEGATIVE 02/25/2021 0118   KETONESUR 5 (A) 02/25/2021 0118   PROTEINUR NEGATIVE 02/25/2021 0118   NITRITE NEGATIVE 02/25/2021 0118   LEUKOCYTESUR NEGATIVE 02/25/2021 0118   Sepsis Labs: _0 (procalcitonin:4,lacticidven:4) ) Recent Results (from the past 240 hour(s))  Culture, blood (routine x 2)     Status: None   Collection Time: 02/21/21  7:30 PM   Specimen: BLOOD  Result Value Ref Range Status   Specimen Description BLOOD RIGHT ANTECUBITAL  Final   Special Requests   Final    BOTTLES DRAWN AEROBIC AND ANAEROBIC Blood Culture adequate volume   Culture   Final    NO GROWTH 5 DAYS Performed at [facility], [address], [place], [place] [address]    Report Status 02/26/2021 FINAL  Final  Culture, blood (routine x 2)     Status: None   Collection Time: 02/21/21  7:30 PM   Specimen: BLOOD RIGHT FOREARM  Result Value Ref Range Status   Specimen Description BLOOD RIGHT FOREARM  Final   Special Requests   Final    BOTTLES DRAWN AEROBIC AND ANAEROBIC Blood Culture adequate volume   Culture   Final    NO GROWTH 5 DAYS Performed at [facility], [address], [place], [place] [address]    Report Status 02/26/2021 FINAL  Final  Urine culture     Status: Abnormal   Collection Time: 02/21/21  9:49 PM   Specimen: Urine, Clean Catch  Result Value Ref Range Status   Specimen Description   Final    URINE, CLEAN CATCH Performed at [facility], [address], [place], [place] [address]    Special Requests   Final    NONE Performed at [facility], [address] [place], [place] [address]    Culture (A)  Final    10,000 COLONIES/mL AEROCOCCUS URINAE Standardized susceptibility testing for this organism is not available. Performed at [place], [place] [address] [place], [place] [address]    Report Status 02/25/2021 FINAL  Final  SARS CORONAVIRUS 2 (Leshea Jaggers 6-24 HRS) Nasopharyngeal Nasopharyngeal Swab     Status: None   Collection Time: 02/24/21  5:32 PM   Specimen: Nasopharyngeal Swab  Result Value Ref Range Status   SARS Coronavirus 2 NEGATIVE NEGATIVE Final    Comment: (NOTE) SARS-CoV-2 target nucleic acids are NOT DETECTED.  The SARS-CoV-2 RNA is generally detectable in upper and lower respiratory specimens during the acute phase of infection. Negative results do not preclude SARS-CoV-2 infection, do not rule out co-infections with other pathogens, and should not be used as the sole basis for treatment or other patient management decisions. Negative results must be combined with clinical observations, patient history, and epidemiological information. The expected result is Negative.  Fact Sheet for Patients: SugarRoll.be  Fact Sheet for Healthcare Providers: https://www.woods-mathews.com/  This test is not yet approved or cleared by the [place] FDA and  has been authorized for detection and/or diagnosis of SARS-CoV-2 by FDA under an  Emergency Use Authorization (EUA). This EUA will remain  in effect (meaning this test can be used) for the duration of the COVID-19 declaration under Se ction 564(b)(1) of the Act, 21 U.S.C. section 360bbb-3(b)(1), unless the authorization is terminated or revoked sooner.  Performed at [facility], [place] [address], [place], [place] [address]   Blood Culture (routine x 2)     Status: None   Collection Time: 02/24/21  5:45 PM   Specimen: BLOOD LEFT ARM  Result Value Ref Range Status   Specimen Description BLOOD LEFT ARM  Final   Special Requests   Final    BOTTLES DRAWN AEROBIC AND ANAEROBIC Blood Culture adequate volume   Culture   Final    NO GROWTH 5 DAYS Performed at [facility], [address] [place], [place] [address]     Report Status 03/01/2021 FINAL  Final  Blood Culture (routine x 2)     Status: None   Collection Time: 02/24/21  5:45 PM   Specimen: BLOOD RIGHT ARM  Result Value Ref Range Status   Specimen Description BLOOD RIGHT ARM  Final   Special Requests   Final    BOTTLES DRAWN AEROBIC AND ANAEROBIC Blood Culture adequate volume   Culture   Final    NO GROWTH 5 DAYS Performed at [facility], [address] [place], [place] [address]    Report Status 03/01/2021 FINAL  Final  Resp Panel by RT-PCR (Flu A&B, Covid) Nasopharyngeal Swab     Status: None   Collection Time: 02/24/21  9:06 PM   Specimen: Nasopharyngeal Swab; Nasopharyngeal(NP) swabs in vial transport medium  Result Value Ref Range Status   SARS Coronavirus 2 by RT PCR NEGATIVE NEGATIVE Final    Comment: (NOTE) SARS-CoV-2 target nucleic acids are NOT DETECTED.  The SARS-CoV-2 RNA is generally detectable in upper respiratory specimens during the acute phase of infection. The lowest concentration of SARS-CoV-2 viral copies this assay can detect is 138 copies/mL. A negative result does not preclude SARS-Cov-2 infection and should not be used as the sole basis for treatment or other patient management decisions. A negative result may occur with  improper specimen collection/handling, submission of specimen other than nasopharyngeal swab, presence of viral mutation(s) within the areas targeted by this assay, and inadequate number of viral copies(<138 copies/mL). A negative result must be combined with clinical observations, patient history, and epidemiological information. The expected result is Negative.  Fact Sheet for Patients:  EntrepreneurPulse.com.au  Fact Sheet for Healthcare Providers:  IncredibleEmployment.be  This test is no t yet approved or cleared by the [place] FDA and  has been authorized for detection and/or diagnosis of SARS-CoV-2 by FDA under an Emergency Use Authorization  (EUA). This EUA will remain  in effect (meaning this test can be used) for the duration of the COVID-19 declaration under Section 564(b)(1) of the Act, 21 U.S.C.section 360bbb-3(b)(1), unless the authorization is terminated  or revoked sooner.       Influenza A by PCR NEGATIVE NEGATIVE Final   Influenza B by PCR NEGATIVE NEGATIVE Final    Comment: (NOTE) The Xpert Xpress SARS-CoV-2/FLU/RSV plus assay is intended as an aid in the diagnosis of influenza from Nasopharyngeal swab specimens and should not be used as a sole basis for treatment. Nasal washings and aspirates are unacceptable for Xpert Xpress SARS-CoV-2/FLU/RSV testing.  Fact Sheet for Patients: EntrepreneurPulse.com.au  Fact Sheet for Healthcare Providers: IncredibleEmployment.be  This test is not yet approved or cleared by the [place]  States FDA and has been authorized for detection and/or diagnosis of SARS-CoV-2 by FDA under an Emergency Use Authorization (EUA). This EUA will remain in effect (meaning this test can be used) for the duration of the COVID-19 declaration under Section 564(b)(1) of the Act, 21 U.S.C. section 360bbb-3(b)(1), unless the authorization is terminated or revoked.  Performed at [facility], [address] [place], [place] [address]   Urine culture     Status: None   Collection Time: 02/25/21  1:18 AM   Specimen: In/Out Cath Urine  Result Value Ref Range Status   Specimen Description   Final    IN/OUT CATH URINE Performed at [facility], [address], [place], [place] [address]    Special Requests   Final    NONE Performed at [facility], [address] [place], [place] [address]    Culture   Final    NO GROWTH Performed at [facility], [place] [address] [place], [place] [address]    Report Status 02/26/2021 FINAL  Final  Culture, blood (routine x 2)     Status: None (Preliminary result)   Collection Time: 02/25/21  5:03 PM   Specimen:  Right Antecubital; Blood  Result Value Ref Range Status   Specimen Description RIGHT ANTECUBITAL  Final   Special Requests   Final    Blood Culture adequate volume BOTTLES DRAWN AEROBIC AND ANAEROBIC   Culture   Final    NO GROWTH 4 DAYS Performed at [facility], [address], [place], [place] [address]    Report Status PENDING  Incomplete  Culture, blood (routine x 2)     Status: None (Preliminary result)   Collection Time: 02/25/21  5:03 PM   Specimen: BLOOD RIGHT ARM  Result Value Ref Range Status   Specimen Description BLOOD RIGHT ARM  Final   Special Requests   Final    Blood Culture results may not be optimal due to an inadequate volume of blood received in culture bottles BOTTLES DRAWN AEROBIC ONLY   Culture   Final    NO GROWTH 4 DAYS Performed at [facility], [address] [place],  [address]    Report Status PENDING  Incomplete     Scheduled Meds: . amLODipine  10 mg Oral Daily  . brimonidine  1 drop Left Eye TID  . feeding supplement  237 mL Oral BID BM  . heparin  5,000 Units Subcutaneous Q8H  . hydrALAZINE  25 mg Oral Q8H  . methylPREDNISolone (SOLU-MEDROL) injection  80 mg Intravenous Daily  . pantoprazole  40 mg Oral Daily  . prednisoLONE acetate  1 drop Left Eye QID  . timolol  1 drop Left Eye BID   Continuous Infusions: . sodium chloride Stopped (02/28/21 1312)    Procedures/Studies: CT Head Wo Contrast  Result Date: 02/24/2021 CLINICAL DATA:  Mental status change. Weakness, loss of appetite and fever. EXAM: CT HEAD WITHOUT CONTRAST TECHNIQUE: Contiguous axial images were obtained from the base of the skull through the vertex without intravenous contrast. COMPARISON:  02/09/2016 FINDINGS: Brain: No evidence of acute infarction, hemorrhage, hydrocephalus, extra-axial collection or mass lesion/mass effect. There is mild diffuse low-attenuation within the subcortical and periventricular white matter compatible with chronic microvascular disease.  Vascular: No hyperdense vessel or unexpected calcification. Skull: Previous right frontal craniotomy. Negative for fracture or focal lesion. Sinuses/Orbits: No acute finding. Other: None. IMPRESSION: 1. No acute intracranial abnormalities. 2. Chronic small vessel ischemic change and brain atrophy. Electronically Signed  By: Kerby Moors M.D.   On: 02/24/2021 18:54   CT CHEST ABDOMEN PELVIS W CONTRAST  Result Date: 02/24/2021 CLINICAL DATA:  Abdomen pain fever and leukocytosis EXAM: CT CHEST, ABDOMEN, AND PELVIS WITH CONTRAST TECHNIQUE: Multidetector CT imaging of the chest, abdomen and pelvis was performed following the standard protocol during bolus administration of intravenous contrast. CONTRAST:  75m OMNIPAQUE IOHEXOL 300 MG/ML  SOLN COMPARISON:  CT 02/09/2016 FINDINGS: CT CHEST FINDINGS Cardiovascular: Mild aortic atherosclerosis. Indistinct wall thickening at the great vessel origins aortic arch and proximal descending aorta. No dissection is seen. There is no aneurysmal formation. Mild aortic atherosclerosis. Cardiomegaly. Common origin of the left common carotid artery and brachiocephalic artery. Cardiomegaly. Trace pericardial effusion. Mediastinum/Nodes: Midline trachea. No thyroid mass. No suspicious adenopathy. Esophagus within normal limits Lungs/Pleura: No consolidation or pneumothorax. No significant pleural effusion Musculoskeletal: No acute or suspicious osseous abnormality. CT ABDOMEN PELVIS FINDINGS Hepatobiliary: Subcentimeter hypodensity within the anterior liver too small to further characterize. No calcified gallstone or biliary dilatation. Pancreas: Unremarkable. No pancreatic ductal dilatation or surrounding inflammatory changes. Spleen: Normal in size without focal abnormality. Adrenals/Urinary Tract: Adrenal glands within normal limits. Kidneys show no hydronephrosis. Cyst in the right kidney. The bladder is unremarkable Stomach/Bowel: The stomach is nonenlarged. Negative appendix.  Wall thickening and inflammatory change involving distal small bowel in the pelvis concerning for enteritis. Vascular/Lymphatic: Nonaneurysmal aorta. Mild aortic atherosclerosis. Possible mild mural thickening of the abdominal aorta, external iliac vessels and common femoral vessels. No suspicious nodes. Reproductive: Status post prostatectomy. Other: Small free fluid in the pelvis.  No free air. Musculoskeletal: No acute or suspicious osseous abnormality IMPRESSION: 1. Prominent wall thickening involving distal small bowel in the pelvis with associated inflammatory changes, consistent with enteritis of infectious, inflammatory, or ischemic etiology. 2. Abnormal appearance of the aortic arch and proximal descending thoracic aorta which demonstrates indistinct mural thickening and surrounding inflammatory change, suspicious for aortitis/vasculitis. This could be secondary to infectious etiologies as well as autoimmune disease. There is no aneurysmal dilatation at this time. 3. Small free fluid in the pelvis Electronically Signed   By: KDonavan FoilM.D.   On: 02/24/2021 23:31   [place]Carotid Bilateral  Result Date: 02/25/2021 CLINICAL DATA:  Vasculitis questioned on previous CT. History of hypertension and hyperlipidemia. Former smoker. EXAM: BILATERAL CAROTID DUPLEX ULTRASOUND TECHNIQUE: GPearline Cablesscale imaging, color Doppler and duplex ultrasound were performed of bilateral carotid and vertebral arteries in the neck. COMPARISON:  Carotid Doppler ultrasound-02/10/2016; CT the chest, abdomen and pelvis-02/24/2021 FINDINGS: Criteria: Quantification of carotid stenosis is based on velocity parameters that correlate the residual internal carotid diameter with NASCET-based stenosis levels, using the diameter of the distal internal carotid lumen as the denominator for stenosis measurement. The following velocity measurements were obtained: RIGHT ICA: 80/19 cm/sec CCA: 1412/87cm/sec SYSTOLIC ICA/CCA RATIO:  0.6 ECA: 104 cm/sec  LEFT ICA: 91/24 cm/sec CCA: 986/76cm/sec SYSTOLIC ICA/CCA RATIO:  0.9 ECA: 110 cm/sec RIGHT CAROTID ARTERY: There is mild tortuosity involving the right common carotid artery (images 4 and 8). There is a moderate amount of eccentric mixed echogenic plaque within the right carotid bulb (image 14 and 16), extending to involve the origin and proximal aspects of the right internal carotid artery (image 24), progressed compared to the 2017 examination though not resulting in elevated peak systolic velocities within the interrogated course of the right internal carotid artery to suggest a hemodynamically significant stenosis. No discrete areas of vessel irregularity or abnormal wall thickening. RIGHT VERTEBRAL ARTERY:  Antegrade flow LEFT CAROTID ARTERY: There is a moderate amount of eccentric hypoechoic plaque involving the distal aspect the left common carotid artery (image 44 and 45). There is a moderate amount of eccentric echogenic partially shadowing plaque within the left carotid bulb (image 50), extending to involve the origin and proximal aspects of the left internal carotid artery (image 58), unchanged to slightly progressed compared to the 2017 examination though not resulting in elevated peak systolic velocities within the interrogated course of the left internal carotid artery to suggest a hemodynamically significant stenosis. No discrete areas of vessel irregularity or thickening. LEFT VERTEBRAL ARTERY:  Antegrade flow IMPRESSION: 1. Moderate amount of bilateral atherosclerotic plaque, unchanged to slightly progressed compared to remote examination performed in 2017, though not resulting in a hemodynamically significant stenosis within either internal carotid artery. 2. No sonographic evidence of vessel wall irregularity or thickening to suggest a vasculitis affecting the carotid arteries. Further evaluation with CTA could be performed as indicated. Electronically Signed   By: Sandi Mariscal M.D.   On:  02/25/2021 16:03   DG Chest Port 1 View  Result Date: 02/24/2021 CLINICAL DATA:  Sepsis EXAM: PORTABLE CHEST 1 VIEW COMPARISON:  11/28/2018 FINDINGS: The heart size and mediastinal contours are within normal limits. Both lungs are clear. The visualized skeletal structures are unremarkable. IMPRESSION: No active disease. Electronically Signed   By: Fidela Salisbury MD   On: 02/24/2021 18:04   US Abdomen Limited RUQ (LIVER/GB)  Result Date: 02/26/2021 CLINICAL DATA:  Elevated LFTs EXAM: ULTRASOUND ABDOMEN LIMITED RIGHT UPPER QUADRANT COMPARISON:  None. FINDINGS: Gallbladder: No gallstones or wall thickening visualized. No sonographic Murphy sign noted by sonographer. Common bile duct: Diameter: Normal at 6 mm Liver: No focal lesion identified. Within normal limits in parenchymal echogenicity. Portal vein is patent on color Doppler imaging with normal direction of blood flow towards the liver. Other: None. IMPRESSION: Unremarkable right upper quadrant ultrasound. Electronically Signed   By: Jacqulynn Cadet M.D.   On: 02/26/2021 13:40    Orson Eva, DO  [facility]  If 7PM-7AM, please contact night-coverage [email or web address] Password [email or web address] 03/01/2021, 4:11 PM   LOS: 4 days

## 2021-03-02 DIAGNOSIS — R509 Fever, unspecified: Secondary | ICD-10-CM

## 2021-03-02 LAB — COMPREHENSIVE METABOLIC PANEL
ALT: 155 U/L — ABNORMAL HIGH (ref 0–44)
AST: 35 U/L (ref 15–41)
Albumin: 2.1 g/dL — ABNORMAL LOW (ref 3.5–5.0)
Alkaline Phosphatase: 237 U/L — ABNORMAL HIGH (ref 38–126)
Anion gap: 6 (ref 5–15)
BUN: 19 mg/dL (ref 8–23)
CO2: 26 mmol/L (ref 22–32)
Calcium: 8.2 mg/dL — ABNORMAL LOW (ref 8.9–10.3)
Chloride: 109 mmol/L (ref 98–111)
Creatinine, Ser: 1.01 mg/dL (ref 0.61–1.24)
GFR, Estimated: >60 mL/min (ref >60)
Glucose, Bld: 108 mg/dL — ABNORMAL HIGH (ref 70–99)
Potassium: 4.1 mmol/L (ref 3.5–5.1)
Sodium: 141 mmol/L (ref 135–145)
Total Bilirubin: 0.4 mg/dL (ref 0.3–1.2)
Total Protein: 6.1 g/dL — ABNORMAL LOW (ref 6.5–8.1)

## 2021-03-02 LAB — CULTURE, BLOOD (ROUTINE X 2)
Culture: NO GROWTH
Culture: NO GROWTH
Special Requests: ADEQUATE

## 2021-03-02 LAB — C-REACTIVE PROTEIN: CRP: 5.7 mg/dL — ABNORMAL HIGH (ref <1.0)

## 2021-03-02 LAB — SURGICAL PATHOLOGY

## 2021-03-02 MED ORDER — PANTOPRAZOLE SODIUM 40 MG PO TBEC: 40.0000 mg | DELAYED_RELEASE_TABLET | Freq: Every day | ORAL | 1 refills | Status: DC

## 2021-03-02 MED ORDER — PREDNISONE 20 MG PO TABS: 60.0000 mg | ORAL_TABLET | Freq: Every day | ORAL | 0 refills | Status: DC

## 2021-03-02 NOTE — Care Management Important Message (Signed)
Important Message  Patient Details  Name: Curtis Marquez MRN: [id number] Date of Birth: [birth year]   Medicare Important Message Given:  Yes     Tommy Medal 03/02/2021, 2:25 PM

## 2021-03-02 NOTE — Progress Notes (Signed)
[facility]  Left temporal wound c/d/i with dermabond. Non tender. No redness or drainage.   Will call with temporal artery biopsy results. Will forward to whoever needs to see them.   No need to follow up in clinic and will call to check on 03/15/2021.   Curlene Labrum, MD [facility] [address] [place], [place] [address] [phone] (office)

## 2021-03-02 NOTE — Discharge Summary (Signed)
Physician Discharge Summary  Curtis Marquez OQH:[id number] DOB: [birth year] DOA: 02/24/2021  PCP: Curtis Blitz, MD  Admit date: 02/24/2021 Discharge date: 03/02/2021  Time spent: 35 minutes  Recommendations for Outpatient Follow-up:  1. Repeat CBC to follow hemoglobin and WBCs trend/stability 2. Repeat complete metabolic panel to follow electrolytes, LFTs and renal function. 3. Follow-up with rheumatology service.   Discharge Diagnoses:  Principal Problem:   Regional enteritis of small bowel ([place]) Active Problems:   Transaminitis   AKI (acute kidney injury) ([place])   Hypoalbuminemia   Leukocytosis   Thrombocytosis   Essential hypertension   Altered mental status   Generalized weakness   Glaucoma   Vasculitis ([place])   Aortitis (H[facility])   Fever   Discharge Condition: Stable and significantly improved; discharged home with instruction to follow-up with rheumatology service as an outpatient.  Code status: full code  Diet recommendation: heart healthy diet  Filed Weights   02/25/21 0527  Weight: 73.4 kg    History of present illness:  Curtis Cainis a 75 y.o.malewith medical history significant forhypertension, glaucoma, s/p brain tumor excision (1994), prostate cancer s/p robotic assisted laparoscopic radical prostatectomy (January 2015) who presents to the emergency department via EMS due to generalized weakness, fever and confusion.Per report, patient has had progressive weakness and confusion with poor appetite since last visit to the ED (5/2). 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 He was started on steroid eye drops. However, he continued to have eye pain and decreased vision. He represented with fatigue and general malaise,fevers and eyepain on  02/24/21. Imaging is notable for enteritis, aortitis,and possible vasculitis. Admitted for further workup.He was started on empiric antibiotics and IV steroids.He improved clinically. 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, Takayasu's, IgG4 or Behcet's -RPR negative -continueempiriczosyn fornow>>discontinue 03/01/10 -Recurrent fevers, leukocytosis -> meets criteria for SIRS -CT with prominent wall thickening involving distal small bowel in the pelvis with associated inflammatory changes - c/w enteritis of infectious, inflammatory or ischemic etiology - also with abnormal appearance of aortic arch and proximal descending thoracic aorta which demonstrates indistinct mural thickening and surrounding inflammatory change, suspicious for aortitis/vasculitis -ESR 120, CRP 21.8 -Procal 0.81>>0.51 -autoimmune labs - ANCA titers, MPO/PR3, anti CCP, dsDNA, Anti-Sm, SS-A, SS-B--ALL NEG -check IgG4--NEG -ANA--NEG -Anti-Sm, anti-DNA, anti-SSA and anti-SSB---neg -C3--200 and C4 27 -RF--23.9 -QuantiFERONgold--NEG -acuteviralhepatitis panel--neg -Discussed with vascular, recommended discussion with ID or rheum--reviewed CT--no dissection noted -No rheum in house, discussed with [facility] rheum.  -Blood cultures--neg to date -Discussed case with ID recommended repeating blood cultures. Consider d/c abx if cx negative at 48 hrs. Carotid dopplers without evidence of vasculitis (moderate bilateral atherosclerotic plaque, no hemodynamically significant stenosis) -Of note, discussed with his ophthalmology office who note his exam was c/w uveitis (increased IOP thought 2/2 to this). His presentation with eye pain, vision loss, concerning for GCA in this presentation, though uveitis doesn't appear to be common finding with this. No  jaw claudication, discomfort with palpation. 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  -remainsA&Ox~2 today when I spoke to him. -Suspect this is 2/2 above. -Head CT with chronic small vessel ischemic changes. -5/8--seems a little better, but clearly not at baseline -5/9--mental status--slowly improving -5/10-mental status continues to improve, near baseline  Essential Hypertension -increase amlodipine to 10 mg daily -start hydralazine 25 mg tid  Elevated LFT's -acute hepatitis panel -RUQ ultrasound--neg -suspect a degree of autoimmune hepatitis vs autoimmune cholangitis -coags unremarkable -largely stable -continue to trend  Acute Kidney Injury -Baseline0.8-1.0 -1.61 at presentation, improving, continue IVF>>1.18  Glaucoma  Uveitis -Continue eye drops -eye pain and visual acuity gradually improving -02/28/21-temporal artery biopsy--results pending  Procedures:  Temporal artery biopsy  See below for x-ray reports  Consultations:  General surgery  Rheumatology service (curbside)  Discharge Exam: Vitals:   03/02/21 0500 03/02/21 1400  BP: 137/80 (!) 152/84  Pulse: 67 87  Resp: 16 18  Temp: 98.4 F (36.9 C) 98.5 F (36.9 C)  SpO2: 99% 100%     General: Pt is alert, awake, not in acute distress Cardiovascular: RRR, S1/S2 +, no rubs, no gallops Respiratory: CTA bilaterally, no wheezing, no rhonchi Abdominal: Soft, NT, ND, bowel sounds + Extremities: no edema, no  cyanosis  Discharge Instructions   Discharge Instructions    Diet - low sodium heart healthy   Complete by: As directed    Discharge instructions   Complete by: As directed    Arrange follow-up with PCP in 10 days Follow-up with rheumatology service as instructed Take medications as prescribed Follow low-sodium diet Maintain adequate hydration Avoid the use of NSAIDs   Increase activity slowly   Complete by: As directed    No wound care   Complete by: As directed      Allergies as of 03/02/2021   No Known Allergies     Medication List    STOP taking these medications   acetaZOLAMIDE 250 MG tablet Commonly known as: DIAMOX   amoxicillin 500 MG tablet Commonly known as: AMOXIL   buPROPion 100 MG 12 hr tablet Commonly known as: WELLBUTRIN SR   clarithromycin 500 MG tablet Commonly known as: Biaxin   clonazePAM 0.5 MG tablet Commonly known as: KLONOPIN   cloNIDine 0.1 MG tablet Commonly known as: CATAPRES   cloNIDine 0.3 MG tablet Commonly known as: CATAPRES   losartan 100 MG tablet Commonly known as: COZAAR   meloxicam 15 MG tablet Commonly known as: MOBIC   olmesartan 40 MG tablet Commonly known as: BENICAR   omeprazole 20 MG capsule Commonly known as: PRILOSEC     TAKE these medications   amLODipine 5 MG tablet Commonly known as: NORVASC Take 2 tablets (10 mg total) by mouth daily. What changed: how much to take   brimonidine 0.2 % ophthalmic solution Commonly known as: ALPHAGAN Place 1 drop into the left eye in the morning and at bedtime.   hydrALAZINE 50 MG tablet Commonly known as: APRESOLINE Take 50 mg by mouth 2 (two) times a day.   levocetirizine 5 MG tablet Commonly known as: XYZAL Take 5 mg by mouth every evening.   pantoprazole 40 MG tablet Commonly known as: PROTONIX Take 1 tablet (40 mg total) by mouth daily. Start taking on: Mar 03, 2021   prednisoLONE acetate 1 % ophthalmic suspension Commonly known as: PRED  FORTE Place 1 drop into the left eye  4 (four) times daily.   predniSONE 20 MG tablet Commonly known as: DELTASONE Take 3 tablets (60 mg total) by mouth daily with breakfast.   timolol 0.5 % ophthalmic solution Commonly known as: BETIMOL Place 1 drop into the left eye 2 (two) times daily.      No Known Allergies  Follow-up Information    Virl Cagey, MD On 03/15/2021.   Specialty: General Surgery Why: post op phone call to check and make sure wound is healing without issues, call if you need to be seen in person  Contact information: [address] Dr Linna Hoff [facility] [address] [phone]        Lahoma Rocker, MD Follow up in 1 week(s).   Specialty: Rheumatology Contact information: [address] [place] [address] [phone]                The results of significant diagnostics from this hospitalization (including imaging, microbiology, ancillary and laboratory) are listed below for reference.    Significant Diagnostic Studies: CT Head Wo Contrast  Result Date: 02/24/2021 CLINICAL DATA:  Mental status change. Weakness, loss of appetite and fever. EXAM: CT HEAD WITHOUT CONTRAST TECHNIQUE: Contiguous axial images were obtained from the base of the skull through the vertex without intravenous contrast. COMPARISON:  02/09/2016 FINDINGS: Brain: No evidence of acute infarction, hemorrhage, hydrocephalus, extra-axial collection or mass lesion/mass effect. There is mild diffuse low-attenuation within the subcortical and periventricular white matter compatible with chronic microvascular disease. Vascular: No hyperdense vessel or unexpected calcification. Skull: Previous right frontal craniotomy. Negative for fracture or focal lesion. Sinuses/Orbits: No acute finding. Other: None. IMPRESSION: 1. No acute intracranial abnormalities. 2. Chronic small vessel ischemic change and brain atrophy. Electronically Signed   By: Kerby Moors M.D.   On: 02/24/2021  18:54   CT CHEST ABDOMEN PELVIS W CONTRAST  Result Date: 02/24/2021 CLINICAL DATA:  Abdomen pain fever and leukocytosis EXAM: CT CHEST, ABDOMEN, AND PELVIS WITH CONTRAST TECHNIQUE: Multidetector CT imaging of the chest, abdomen and pelvis was performed following the standard protocol during bolus administration of intravenous contrast. CONTRAST:  81m OMNIPAQUE IOHEXOL 300 MG/ML  SOLN COMPARISON:  CT 02/09/2016 FINDINGS: CT CHEST FINDINGS Cardiovascular: Mild aortic atherosclerosis. Indistinct wall thickening at the great vessel origins aortic arch and proximal descending aorta. No dissection is seen. There is no aneurysmal formation. Mild aortic atherosclerosis. Cardiomegaly. Common origin of the left common carotid artery and brachiocephalic artery. Cardiomegaly. Trace pericardial effusion. Mediastinum/Nodes: Midline trachea. No thyroid mass. No suspicious adenopathy. Esophagus within normal limits Lungs/Pleura: No consolidation or pneumothorax. No significant pleural effusion Musculoskeletal: No acute or suspicious osseous abnormality. CT ABDOMEN PELVIS FINDINGS Hepatobiliary: Subcentimeter hypodensity within the anterior liver too small to further characterize. No calcified gallstone or biliary dilatation. Pancreas: Unremarkable. No pancreatic ductal dilatation or surrounding inflammatory changes. Spleen: Normal in size without focal abnormality. Adrenals/Urinary Tract: Adrenal glands within normal limits. Kidneys show no hydronephrosis. Cyst in the right kidney. The bladder is unremarkable Stomach/Bowel: The stomach is nonenlarged. Negative appendix. Wall thickening and inflammatory change involving distal small bowel in the pelvis concerning for enteritis. Vascular/Lymphatic: Nonaneurysmal aorta. Mild aortic atherosclerosis. Possible mild mural thickening of the abdominal aorta, external iliac vessels and common femoral vessels. No suspicious nodes. Reproductive: Status post prostatectomy. Other: Small free  fluid in the pelvis.  No free air. Musculoskeletal: No acute or suspicious osseous abnormality IMPRESSION: 1. Prominent wall thickening involving distal small bowel in the pelvis with associated inflammatory changes, consistent with enteritis of infectious, inflammatory,  or ischemic etiology. 2. Abnormal appearance of the aortic arch and proximal descending thoracic aorta which demonstrates indistinct mural thickening and surrounding inflammatory change, suspicious for aortitis/vasculitis. This could be secondary to infectious etiologies as well as autoimmune disease. There is no aneurysmal dilatation at this time. 3. Small free fluid in the pelvis Electronically Signed   By: Donavan Foil M.D.   On: 02/24/2021 23:31   US Carotid Bilateral  Result Date: 02/25/2021 CLINICAL DATA:  Vasculitis questioned on previous CT. History of hypertension and hyperlipidemia. Former smoker. EXAM: BILATERAL CAROTID DUPLEX ULTRASOUND TECHNIQUE: Pearline Cables scale imaging, color Doppler and duplex ultrasound were performed of bilateral carotid and vertebral arteries in the neck. COMPARISON:  Carotid Doppler ultrasound-02/10/2016; CT the chest, abdomen and pelvis-02/24/2021 FINDINGS: Criteria: Quantification of carotid stenosis is based on velocity parameters that correlate the residual internal carotid diameter with NASCET-based stenosis levels, using the diameter of the distal internal carotid lumen as the denominator for stenosis measurement. The following velocity measurements were obtained: RIGHT ICA: 80/19 cm/sec CCA: 478/29 cm/sec SYSTOLIC ICA/CCA RATIO:  0.6 ECA: 104 cm/sec LEFT ICA: 91/24 cm/sec CCA: 56/21 cm/sec SYSTOLIC ICA/CCA RATIO:  0.9 ECA: 110 cm/sec RIGHT CAROTID ARTERY: There is mild tortuosity involving the right common carotid artery (images 4 and 8). There is a moderate amount of eccentric mixed echogenic plaque within the right carotid bulb (image 14 and 16), extending to involve the origin and proximal aspects of the  right internal carotid artery (image 24), progressed compared to the 2017 examination though not resulting in elevated peak systolic velocities within the interrogated course of the right internal carotid artery to suggest a hemodynamically significant stenosis. No discrete areas of vessel irregularity or abnormal wall thickening. RIGHT VERTEBRAL ARTERY:  Antegrade flow LEFT CAROTID ARTERY: There is a moderate amount of eccentric hypoechoic plaque involving the distal aspect the left common carotid artery (image 44 and 45). There is a moderate amount of eccentric echogenic partially shadowing plaque within the left carotid bulb (image 50), extending to involve the origin and proximal aspects of the left internal carotid artery (image 58), unchanged to slightly progressed compared to the 2017 examination though not resulting in elevated peak systolic velocities within the interrogated course of the left internal carotid artery to suggest a hemodynamically significant stenosis. No discrete areas of vessel irregularity or thickening. LEFT VERTEBRAL ARTERY:  Antegrade flow IMPRESSION: 1. Moderate amount of bilateral atherosclerotic plaque, unchanged to slightly progressed compared to remote examination performed in 2017, though not resulting in a hemodynamically significant stenosis within either internal carotid artery. 2. No sonographic evidence of vessel wall irregularity or thickening to suggest a vasculitis affecting the carotid arteries. Further evaluation with CTA could be performed as indicated. Electronically Signed   By: Sandi Mariscal M.D.   On: 02/25/2021 16:03   DG Chest Port 1 View  Result Date: 02/24/2021 CLINICAL DATA:  Sepsis EXAM: PORTABLE CHEST 1 VIEW COMPARISON:  11/28/2018 FINDINGS: The heart size and mediastinal contours are within normal limits. Both lungs are clear. The visualized skeletal structures are unremarkable. IMPRESSION: No active disease. Electronically Signed   By: Fidela Salisbury MD    On: 02/24/2021 18:04   US Abdomen Limited RUQ (LIVER/GB)  Result Date: 02/26/2021 CLINICAL DATA:  Elevated LFTs EXAM: ULTRASOUND ABDOMEN LIMITED RIGHT UPPER QUADRANT COMPARISON:  None. FINDINGS: Gallbladder: No gallstones or wall thickening visualized. No sonographic Murphy sign noted by sonographer. Common bile duct: Diameter: Normal at 6 mm Liver: No focal lesion identified. Within normal limits in  parenchymal echogenicity. Portal vein is patent on color Doppler imaging with normal direction of blood flow towards the liver. Other: None. IMPRESSION: Unremarkable right upper quadrant ultrasound. Electronically Signed   By: Jacqulynn Cadet M.D.   On: 02/26/2021 13:40    Microbiology: Recent Results (from the past 240 hour(s))  Culture, blood (routine x 2)     Status: None   Collection Time: 02/21/21  7:30 PM   Specimen: BLOOD  Result Value Ref Range Status   Specimen Description BLOOD RIGHT ANTECUBITAL  Final   Special Requests   Final    BOTTLES DRAWN AEROBIC AND ANAEROBIC Blood Culture adequate volume   Culture   Final    NO GROWTH 5 DAYS Performed at [facility], [address], [place], [place] [address]    Report Status 02/26/2021 FINAL  Final  Culture, blood (routine x 2)     Status: None   Collection Time: 02/21/21  7:30 PM   Specimen: BLOOD RIGHT FOREARM  Result Value Ref Range Status   Specimen Description BLOOD RIGHT FOREARM  Final   Special Requests   Final    BOTTLES DRAWN AEROBIC AND ANAEROBIC Blood Culture adequate volume   Culture   Final    NO GROWTH 5 DAYS Performed at [facility], [address], [place], [place] [address]    Report Status 02/26/2021 FINAL  Final  Urine culture     Status: Abnormal   Collection Time: 02/21/21  9:49 PM   Specimen: Urine, Clean Catch  Result Value Ref Range Status   Specimen Description   Final    URINE, CLEAN CATCH Performed at [facility], [address], [place], [place] [address]    Special Requests   Final     NONE Performed at [facility], [address], [place], [place] [address]    Culture (A)  Final    10,000 COLONIES/mL AEROCOCCUS URINAE Standardized susceptibility testing for this organism is not available. Performed at [facility], [place] [address], [place], [place] [address]    Report Status 02/25/2021 FINAL  Final  SARS CORONAVIRUS 2 (TAT 6-24 HRS) Nasopharyngeal Nasopharyngeal Swab     Status: None   Collection Time: 02/24/21  5:32 PM   Specimen: Nasopharyngeal Swab  Result Value Ref Range Status   SARS Coronavirus 2 NEGATIVE NEGATIVE Final    Comment: (NOTE) SARS-CoV-2 target nucleic acids are NOT DETECTED.  The SARS-CoV-2 RNA is generally detectable in upper and lower respiratory specimens during the acute phase of infection. Negative results do not preclude SARS-CoV-2 infection, do not rule out co-infections with other pathogens, and should not be used as the sole basis for treatment or other patient management decisions. Negative results must be combined with clinical observations, patient history, and epidemiological information. The expected result is Negative.  Fact Sheet for Patients: SugarRoll.be  Fact Sheet for Healthcare Providers: https://www.woods-mathews.com/  This test is not yet approved or cleared by the [place] FDA and  has been authorized for detection and/or diagnosis of SARS-CoV-2 by FDA under an Emergency Use Authorization (EUA). This EUA will remain  in effect (meaning this test can be used) for the duration of the COVID-19 declaration under Se ction 564(b)(1) of the Act, 21 U.S.C. section 360bbb-3(b)(1), unless the authorization is terminated or revoked sooner.  Performed at [facility], [place] [address] [place],  [address]   Blood Culture (routine x 2)     Status: None   Collection Time: 02/24/21  5:45  PM   Specimen: BLOOD LEFT ARM  Result Value Ref Range Status   Specimen  Description BLOOD LEFT ARM  Final   Special Requests   Final    BOTTLES DRAWN AEROBIC AND ANAEROBIC Blood Culture adequate volume   Culture   Final    NO GROWTH 5 DAYS Performed at [facility], [address] [place], [place] [address]    Report Status 03/01/2021 FINAL  Final  Blood Culture (routine x 2)     Status: None   Collection Time: 02/24/21  5:45 PM   Specimen: BLOOD RIGHT ARM  Result Value Ref Range Status   Specimen Description BLOOD RIGHT ARM  Final   Special Requests   Final    BOTTLES DRAWN AEROBIC AND ANAEROBIC Blood Culture adequate volume   Culture   Final    NO GROWTH 5 DAYS Performed at [facility], [address], [place], [place] [address]    Report Status 03/01/2021 FINAL  Final  Resp Panel by RT-PCR (Flu A&B, Covid) Nasopharyngeal Swab     Status: None   Collection Time: 02/24/21  9:06 PM   Specimen: Nasopharyngeal Swab; Nasopharyngeal(NP) swabs in vial transport medium  Result Value Ref Range Status   SARS Coronavirus 2 by RT PCR NEGATIVE NEGATIVE Final    Comment: (NOTE) SARS-CoV-2 target nucleic acids are NOT DETECTED.  The SARS-CoV-2 RNA is generally detectable in upper respiratory specimens during the acute phase of infection. The lowest concentration of SARS-CoV-2 viral copies this assay can detect is 138 copies/mL. A negative result does not preclude SARS-Cov-2 infection and should not be used as the sole basis for treatment or other patient management decisions. A negative result may occur with  improper specimen collection/handling, submission of specimen other than nasopharyngeal swab, presence of viral mutation(s) within the areas targeted by this assay, and inadequate number of viral copies(<138 copies/mL). A negative result must be combined with clinical observations, patient history, and epidemiological information. The expected result is Negative.  Fact Sheet for Patients:  EntrepreneurPulse.com.au  Fact Sheet for  Healthcare Providers:  IncredibleEmployment.be  This test is no t yet approved or cleared by the [place] FDA and  has been authorized for detection and/or diagnosis of SARS-CoV-2 by FDA under an Emergency Use Authorization (EUA). This EUA will remain  in effect (meaning this test can be used) for the duration of the COVID-19 declaration under Section 564(b)(1) of the Act, 21 U.S.C.section 360bbb-3(b)(1), unless the authorization is terminated  or revoked sooner.       Influenza A by PCR NEGATIVE NEGATIVE Final   Influenza B by PCR NEGATIVE NEGATIVE Final    Comment: (NOTE) The Xpert Xpress SARS-CoV-2/FLU/RSV plus assay is intended as an aid in the diagnosis of influenza from Nasopharyngeal swab specimens and should not be used as a sole basis for treatment. Nasal washings and aspirates are unacceptable for Xpert Xpress SARS-CoV-2/FLU/RSV testing.  Fact Sheet for Patients: EntrepreneurPulse.com.au  Fact Sheet for Healthcare Providers: IncredibleEmployment.be  This test is not yet approved or cleared by the [place] FDA and has been authorized for detection and/or diagnosis of SARS-CoV-2 by FDA under an Emergency Use Authorization (EUA). This EUA will remain in effect (meaning this test can be used) for the duration of the COVID-19 declaration under Section 564(b)(1) of the Act, 21 U.S.C. section 360bbb-3(b)(1), unless the authorization is terminated or revoked.  Performed at [facility], [address], [place], [place] [address]   Urine culture     Status:  None   Collection Time: 02/25/21  1:18 AM   Specimen: In/Out Cath Urine  Result Value Ref Range Status   Specimen Description   Final    IN/OUT CATH URINE Performed at [facility], [address], [place], [place] [address]    Special Requests   Final    NONE Performed at [facility], [address], [place], [place] [address]    Culture   Final    NO  GROWTH Performed at [facility], [place] [address] [place], [place] [address]    Report Status 02/26/2021 FINAL  Final  Culture, blood (routine x 2)     Status: None   Collection Time: 02/25/21  5:03 PM   Specimen: Right Antecubital; Blood  Result Value Ref Range Status   Specimen Description RIGHT ANTECUBITAL  Final   Special Requests   Final    Blood Culture adequate volume BOTTLES DRAWN AEROBIC AND ANAEROBIC   Culture   Final    NO GROWTH 5 DAYS Performed at [facility] [address] [place], [place] [address]    Report Status 03/02/2021 FINAL  Final  Culture, blood (routine x 2)     Status: None   Collection Time: 02/25/21  5:03 PM   Specimen: BLOOD RIGHT ARM  Result Value Ref Range Status   Specimen Description BLOOD RIGHT ARM  Final   Special Requests   Final    Blood Culture results may not be optimal due to an inadequate volume of blood received in culture bottles BOTTLES DRAWN AEROBIC ONLY   Culture   Final    NO GROWTH 5 DAYS Performed at [facility], [address], [place], [place] [address]    Report Status 03/02/2021 FINAL  Final     Labs: Basic Metabolic Panel: Recent Labs  Lab 02/25/21 0445 02/26/21 0629 02/27/21 0607 03/01/21 0615 03/02/21 0556  NA 138 139 141 141 141  K 3.2* 4.1 4.5 3.7 4.1  CL 107 108 106 110 109  CO2 22 22 27 23 26   GLUCOSE 125* 140* 133* 110* 108*  BUN 30* 31* 33* 17 19  CREATININE 1.34* 1.29* 1.18 0.95 1.01  CALCIUM 7.9* 8.3* 8.4* 7.9* 8.2*  MG 2.4 2.5*  --   --   --   PHOS 3.9 4.0  --   --   --    Liver Function Tests: Recent Labs  Lab 02/25/21 0445 02/26/21 0629 02/27/21 0607 03/01/21 0615 03/02/21 0556  AST 98* 63* 69* 50* 35  ALT 127* 129* 145* 178* 155*  ALKPHOS 250* 245* 258* 246* 237*  BILITOT 1.3* 0.6 0.7 0.7 0.4  PROT 6.2* 6.2* 6.7 6.0* 6.1*  ALBUMIN 2.1* 1.9* 2.1* 2.1* 2.1*   CBC: Recent Labs  Lab 02/24/21 1745 02/25/21 0156 02/25/21 0445 02/26/21 0629 02/27/21 0607 03/01/21 0615  WBC  15.7* 17.2* 17.3* 16.9* 19.7* 18.5*  NEUTROABS 13.2*  --   --  15.0*  --  14.3*  HGB 12.1* 10.4* 10.2* 10.1* 10.9* 11.2*  HCT 37.7* 32.6* 32.0* 31.6* 34.8* 36.0*  MCV 90.6 90.6 90.4 90.8 91.3 91.8  PLT 641* 509* 494* 551* 634* 674*   CBG: Recent Labs  Lab 02/26/21 2217 02/27/21 2157  GLUCAP 193* 110*     Signed:  Barton Dubois MD.  [facility] 03/02/2021, 4:09 PM

## 2021-03-02 NOTE — TOC Progression Note (Signed)
Transition of Care [facility]) - Progression Note    Patient Details  Name: Curtis Marquez MRN: [id number] Date of Birth: [birth year]  Transition of Care [facility]) CM/SW Contact  Salome Arnt,  Phone Number: 03/02/2021, 3:59 PM  Clinical Narrative:  Pt states he will be d/c today and reports no needs.         Barriers to Discharge: Barriers Resolved  Expected Discharge Plan and Services           Expected Discharge Date: 03/01/21                                     Social Determinants of Health (SDOH) Interventions    Readmission Risk Interventions No flowsheet data found.

## 2021-03-03 ENCOUNTER — Telehealth (INDEPENDENT_AMBULATORY_CARE_PROVIDER_SITE_OTHER): Payer: Medicare Other | Admitting: General Surgery

## 2021-03-03 DIAGNOSIS — I776 Arteritis, unspecified: Secondary | ICD-10-CM

## 2021-03-03 NOTE — Telephone Encounter (Signed)
[facility]  Notified patient of pathology and will forward to Rheumatology.   A. TEMPORAL ARTERY, LEFT, BIOPSY:  - Arterial wall without significant inflammation   Curlene Labrum, MD [facility] [address] [place], [place] [address] [phone] (office)

## 2021-03-10 DIAGNOSIS — R519 Headache, unspecified: Secondary | ICD-10-CM | POA: Diagnosis not present

## 2021-03-10 DIAGNOSIS — K529 Noninfective gastroenteritis and colitis, unspecified: Secondary | ICD-10-CM | POA: Diagnosis not present

## 2021-03-10 DIAGNOSIS — R030 Elevated blood-pressure reading, without diagnosis of hypertension: Secondary | ICD-10-CM | POA: Diagnosis not present

## 2021-03-10 DIAGNOSIS — Z299 Encounter for prophylactic measures, unspecified: Secondary | ICD-10-CM | POA: Diagnosis not present

## 2021-03-10 DIAGNOSIS — I776 Arteritis, unspecified: Secondary | ICD-10-CM | POA: Diagnosis not present

## 2021-03-10 DIAGNOSIS — Z6823 Body mass index (BMI) 23.0-23.9, adult: Secondary | ICD-10-CM | POA: Diagnosis not present

## 2021-03-10 DIAGNOSIS — H5710 Ocular pain, unspecified eye: Secondary | ICD-10-CM | POA: Diagnosis not present

## 2021-03-15 ENCOUNTER — Encounter: Payer: Medicare Other | Admitting: General Surgery

## 2021-03-15 ENCOUNTER — Ambulatory Visit (INDEPENDENT_AMBULATORY_CARE_PROVIDER_SITE_OTHER): Payer: Medicare Other | Admitting: General Surgery

## 2021-03-15 DIAGNOSIS — I776 Arteritis, unspecified: Secondary | ICD-10-CM

## 2021-03-15 NOTE — Progress Notes (Signed)
[facility]  I am calling the patient for post operative evaluation. This is not a billable encounter as it is under the [facility] charges for the surgery.  The patient had a temporal artery biopsy on 02/28/2021. Patient reports he is doing well and incision is healing. No issues.   Pathology: Did not show any vasculitis.  FINAL MICROSCOPIC DIAGNOSIS:   A. TEMPORAL ARTERY, LEFT, BIOPSY:  - Arterial wall without significant inflammation   Will see the patient PRN.  Will forward pathology to Dr. Manuella Ghazi PCP.  Curlene Labrum, MD [facility] [address] [place], [place] [address] [phone] (office)

## 2021-04-11 DIAGNOSIS — I776 Arteritis, unspecified: Secondary | ICD-10-CM | POA: Diagnosis not present

## 2021-04-11 DIAGNOSIS — I10 Essential (primary) hypertension: Secondary | ICD-10-CM | POA: Diagnosis not present

## 2021-04-11 DIAGNOSIS — Z299 Encounter for prophylactic measures, unspecified: Secondary | ICD-10-CM | POA: Diagnosis not present

## 2021-04-11 DIAGNOSIS — R5383 Other fatigue: Secondary | ICD-10-CM | POA: Diagnosis not present

## 2021-04-11 DIAGNOSIS — N183 Chronic kidney disease, stage 3 unspecified: Secondary | ICD-10-CM | POA: Diagnosis not present

## 2021-04-26 DIAGNOSIS — I739 Peripheral vascular disease, unspecified: Secondary | ICD-10-CM | POA: Diagnosis not present

## 2021-04-26 DIAGNOSIS — I776 Arteritis, unspecified: Secondary | ICD-10-CM | POA: Diagnosis not present

## 2021-04-26 DIAGNOSIS — H40212 Acute angle-closure glaucoma, left eye: Secondary | ICD-10-CM | POA: Diagnosis not present

## 2021-04-26 DIAGNOSIS — Z299 Encounter for prophylactic measures, unspecified: Secondary | ICD-10-CM | POA: Diagnosis not present

## 2021-04-28 DIAGNOSIS — H40052 Ocular hypertension, left eye: Secondary | ICD-10-CM | POA: Diagnosis not present

## 2021-05-06 DIAGNOSIS — I10 Essential (primary) hypertension: Secondary | ICD-10-CM | POA: Diagnosis not present

## 2021-05-06 DIAGNOSIS — H40219 Acute angle-closure glaucoma, unspecified eye: Secondary | ICD-10-CM | POA: Diagnosis not present

## 2021-05-06 DIAGNOSIS — R030 Elevated blood-pressure reading, without diagnosis of hypertension: Secondary | ICD-10-CM | POA: Diagnosis not present

## 2021-05-06 DIAGNOSIS — I776 Arteritis, unspecified: Secondary | ICD-10-CM | POA: Diagnosis not present

## 2021-05-06 DIAGNOSIS — Z299 Encounter for prophylactic measures, unspecified: Secondary | ICD-10-CM | POA: Diagnosis not present

## 2021-05-06 DIAGNOSIS — Z6822 Body mass index (BMI) 22.0-22.9, adult: Secondary | ICD-10-CM | POA: Diagnosis not present

## 2021-05-06 DIAGNOSIS — R609 Edema, unspecified: Secondary | ICD-10-CM | POA: Diagnosis not present

## 2021-05-19 DIAGNOSIS — H40052 Ocular hypertension, left eye: Secondary | ICD-10-CM | POA: Diagnosis not present

## 2021-06-13 DIAGNOSIS — Z6823 Body mass index (BMI) 23.0-23.9, adult: Secondary | ICD-10-CM | POA: Diagnosis not present

## 2021-06-13 DIAGNOSIS — Z0000 Encounter for general adult medical examination without abnormal findings: Secondary | ICD-10-CM | POA: Diagnosis not present

## 2021-06-13 DIAGNOSIS — I739 Peripheral vascular disease, unspecified: Secondary | ICD-10-CM | POA: Diagnosis not present

## 2021-06-13 DIAGNOSIS — Z299 Encounter for prophylactic measures, unspecified: Secondary | ICD-10-CM | POA: Diagnosis not present

## 2021-06-13 DIAGNOSIS — Z79899 Other long term (current) drug therapy: Secondary | ICD-10-CM | POA: Diagnosis not present

## 2021-06-13 DIAGNOSIS — R5383 Other fatigue: Secondary | ICD-10-CM | POA: Diagnosis not present

## 2021-06-13 DIAGNOSIS — I10 Essential (primary) hypertension: Secondary | ICD-10-CM | POA: Diagnosis not present

## 2021-06-13 DIAGNOSIS — E7800 Pure hypercholesterolemia, unspecified: Secondary | ICD-10-CM | POA: Diagnosis not present

## 2021-06-13 DIAGNOSIS — Z7189 Other specified counseling: Secondary | ICD-10-CM | POA: Diagnosis not present

## 2021-06-20 DIAGNOSIS — Q254 Congenital malformation of aorta unspecified: Secondary | ICD-10-CM | POA: Diagnosis not present

## 2021-06-20 DIAGNOSIS — H209 Unspecified iridocyclitis: Secondary | ICD-10-CM | POA: Diagnosis not present

## 2021-06-21 DIAGNOSIS — H40052 Ocular hypertension, left eye: Secondary | ICD-10-CM | POA: Diagnosis not present

## 2021-06-22 ENCOUNTER — Other Ambulatory Visit (HOSPITAL_COMMUNITY): Payer: Self-pay | Admitting: Rheumatology

## 2021-06-22 DIAGNOSIS — Q254 Congenital malformation of aorta unspecified: Secondary | ICD-10-CM

## 2021-07-19 ENCOUNTER — Ambulatory Visit (INDEPENDENT_AMBULATORY_CARE_PROVIDER_SITE_OTHER): Payer: Medicare Other | Admitting: Gastroenterology

## 2021-07-19 ENCOUNTER — Encounter: Payer: Self-pay | Admitting: Gastroenterology

## 2021-07-19 ENCOUNTER — Other Ambulatory Visit: Payer: Self-pay

## 2021-07-19 VITALS — BP 170/60 | HR 99 | Temp 97.7°F | Ht 68.0 in | Wt 161.0 lb

## 2021-07-19 DIAGNOSIS — K219 Gastro-esophageal reflux disease without esophagitis: Secondary | ICD-10-CM | POA: Diagnosis not present

## 2021-07-19 DIAGNOSIS — K529 Noninfective gastroenteritis and colitis, unspecified: Secondary | ICD-10-CM | POA: Diagnosis not present

## 2021-07-19 NOTE — Patient Instructions (Signed)
I will speak with Dr. Abbey Chatters about  next steps.  In meantime, I am requesting your labs and prior colonoscopy report.  Further recommendations to follow!  It was a pleasure to see you today. I want to create trusting relationships with patients to provide genuine, compassionate, and quality care. I value your feedback. If you receive a survey regarding your visit,  I greatly appreciate you taking time to fill this out.   Annitta Needs, PhD, ANP-BC [facility]

## 2021-07-19 NOTE — Progress Notes (Signed)
Primary Care Physician:  Monico Blitz, MD Primary Gastroenterologist:  Dr. Abbey Chatters  Chief Complaint  Patient presents with   Gastroesophageal Reflux    F/u doing okay    HPI:   Curtis Marquez is a 75 y.o. male with history of hypertension, glaucoma, brain tumor s/p resection, prostate cancer s/p resection, hypertension and hyperlipidemia, presenting today in hospital follow-up from May 2022. He was hospitalized for a constellation of symptoms including weakness, changes in vision, possible vasculitis, and was found to have distal small bowel enteritis on CT. He also had elevated liver enzymes with negative AMA, ASMA, low iron at 17 but ferritin elevated acutely in setting of acute illness (normal sats), ANA negative, IgG normal, negative acute hepatitis panel. RUQ US unremarkable.   He has had blood work through PCP and reports normalization of labs. I am requesting this. Last colonoscopy in [place] but unsure when. No polyps. No abdominal pain, N/V, GERD, dysphagia, unexplained weight loss, lack of appetite. No overt GI bleeding. He has no GI concerns today. He would like to hold off on colonoscopy if possible.   Past Medical History:  Diagnosis Date   Brain tumor [facility])    GERD (gastroesophageal reflux disease)    Hypercholesteremia    Hypertension    Prostate cancer ([place]) 11/13/13    Past Surgical History:  Procedure Laterality Date   ARTERY BIOPSY Left 02/28/2021   Procedure: BIOPSY TEMPORAL ARTERY;  Surgeon: Virl Cagey, MD;  Location: [facility] ORS;  Service: General;  Laterality: Left;   BRAIN TUMOR EXCISION  10/23/1992   ESOPHAGOGASTRODUODENOSCOPY N/A 03/18/2019   food impaction, benign-appearing esophageal stenosis, mild gastritis.   ESOPHAGOGASTRODUODENOSCOPY N/A 03/24/2019   one benign-appearing, intrinsic moderate stenosis was fond s/p dilation. Mild gastritis.   LYMPHADENECTOMY Bilateral 11/13/2013   Procedure: LYMPHADENECTOMY "PELVIC LYMPH NODE DISSECTION";  Surgeon: Dutch Gray, MD;  Location: [facility] ORS;  Service: Urology;  Laterality: Bilateral;   ROBOT ASSISTED LAPAROSCOPIC RADICAL PROSTATECTOMY N/A 11/13/2013   Procedure: ROBOTIC ASSISTED LAPAROSCOPIC RADICAL PROSTATECTOMY LEVEL 2;  Surgeon: Dutch Gray, MD;  Location: [facility];  Service: Urology;  Laterality: N/A;   SAVORY DILATION N/A 03/24/2019   Procedure: SAVORY DILATION;  Surgeon: Danie Binder, MD;  Location: [facility] ENDO SUITE;  Service: Endoscopy;  Laterality: N/A;    Current Outpatient Medications  Medication Sig Dispense Refill   amLODipine (NORVASC) 5 MG tablet Take 2 tablets (10 mg total) by mouth daily. 30 tablet 2   pantoprazole (PROTONIX) 40 MG tablet Take 1 tablet (40 mg total) by mouth daily. 30 tablet 1   timolol (BETIMOL) 0.5 % ophthalmic solution Place 1 drop into the left eye 2 (two) times daily.     No current facility-administered medications for this visit.    Allergies as of 07/19/2021   (No Known Allergies)    Family History  Problem Relation Age of Onset   Diabetes Mother    Hypertension Mother    CVA Father    Diabetes Sister    Hypertension Sister    Cancer Brother    Colon cancer Neg Hx    Colon polyps Neg Hx     Social History   Socioeconomic History   Marital status: Married    Spouse name: Not on file   Number of children: Not on file   Years of education: Not on file   Highest education level: Not on file  Occupational History   Not on file  Tobacco Use   Smoking status: Former  Smokeless tobacco: Never  Vaping Use   Vaping Use: Never used  Substance and Sexual Activity   Alcohol use: No    Alcohol/week: 0.0 standard drinks   Drug use: No   Sexual activity: Not on file  Other Topics Concern   Not on file  Social History Narrative   Not on file   Social Determinants of Health   Financial Resource Strain: Not on file  Food Insecurity: Not on file  Transportation Needs: Not on file  Physical Activity: Not on file  Stress: Not on file  Social  Connections: Not on file  Intimate Partner Violence: Not on file    Review of Systems: Gen: Denies any fever, chills, fatigue, weight loss, lack of appetite.  CV: Denies chest pain, heart palpitations, peripheral edema, syncope.  Resp: Denies shortness of breath at rest or with exertion. Denies wheezing or cough.  GI: see HPI GU : Denies urinary burning, urinary frequency, urinary hesitancy MS: Denies joint pain, muscle weakness, cramps, or limitation of movement.  Derm: Denies rash, itching, dry skin Psych: Denies depression, anxiety, memory loss, and confusion Heme: Denies bruising, bleeding, and enlarged lymph nodes.  Physical Exam: BP (!) 170/60 Comment: reports BP was 144/89 when he left his house  Pulse 99   Temp 97.7 F (36.5 C)   Ht 5' 8"  (1.727 m)   Wt 161 lb (73 kg)   BMI 24.48 kg/m  General:   Alert and oriented. Pleasant and cooperative. Well-nourished and well-developed.  Head:  Normocephalic and atraumatic. Eyes:  Without icterus, sclera clear and conjunctiva pink.  Ears:  Normal auditory acuity. Mouth:  mask in place Lungs:  Clear to auscultation bilaterally. No wheezes, rales, or rhonchi. No distress.  Heart:  S1, S2 present without murmurs appreciated.  Abdomen:  +BS, soft, non-tender and non-distended. No HSM noted. No guarding or rebound. No masses appreciated.  Rectal:  Deferred  Msk:  Symmetrical without gross deformities. Normal posture. Extremities:  Without edema. Neurologic:  Alert and  oriented x4;  grossly normal neurologically. Skin:  Intact without significant lesions or rashes. Psych:  Alert and cooperative. Normal mood and affect.  ASSESSMENT/PLAN: Curtis Marquez is a 75 y.o. male presenting today with history of hypertension, glaucoma, brain tumor s/p resection, prostate cancer s/p resection, hypertension and hyperlipidemia, presenting today in hospital follow-up from May 2022. He was hospitalized for a constellation of symptoms including weakness,  changes in vision, possible vasculitis, and was found to have distal small bowel enteritis on CT. He also had elevated liver enzymes with negative AMA, ASMA, low iron at 17 but ferritin elevated acutely in setting of acute illness (normal sats), ANA negative, IgG normal, negative acute hepatitis panel. RUQ US unremarkable.   Presenting in follow-up today with no GI symptoms or concerns. Last colonoscopy in [place], which I'm requesting. Consideration had been raised for ileocolonoscopy; he would like to hold off unless necessary. Could potentially repeat CT.   Will request outside colonoscopy and discuss further with Dr. Abbey Chatters before deciding next steps. Patient aware. Requesting outside labs from PCP.   Annitta Needs, PhD, ANP-BC [facility]

## 2021-07-21 ENCOUNTER — Encounter: Payer: Self-pay | Admitting: Gastroenterology

## 2021-07-25 DIAGNOSIS — Z299 Encounter for prophylactic measures, unspecified: Secondary | ICD-10-CM | POA: Diagnosis not present

## 2021-07-25 DIAGNOSIS — R609 Edema, unspecified: Secondary | ICD-10-CM | POA: Diagnosis not present

## 2021-07-25 DIAGNOSIS — N182 Chronic kidney disease, stage 2 (mild): Secondary | ICD-10-CM | POA: Diagnosis not present

## 2021-07-25 DIAGNOSIS — Z2821 Immunization not carried out because of patient refusal: Secondary | ICD-10-CM | POA: Diagnosis not present

## 2021-07-25 DIAGNOSIS — I10 Essential (primary) hypertension: Secondary | ICD-10-CM | POA: Diagnosis not present

## 2021-07-25 DIAGNOSIS — R739 Hyperglycemia, unspecified: Secondary | ICD-10-CM | POA: Diagnosis not present

## 2021-07-26 ENCOUNTER — Ambulatory Visit (HOSPITAL_COMMUNITY): Payer: Medicare Other

## 2021-07-26 ENCOUNTER — Encounter (HOSPITAL_COMMUNITY): Payer: Self-pay

## 2021-07-27 ENCOUNTER — Telehealth: Payer: Self-pay | Admitting: Gastroenterology

## 2021-07-27 NOTE — Telephone Encounter (Signed)
Received colonoscopy reports dated Sept 2016:  Normal colonoscopy.    Outside LFTs from Aug 2022: Tbili 0.3, Alk Phos 145, AST 11, ALT 13.  Hgb mildly low at 12.6.    Dena: if patient is willing, recommend ileocolonoscopy. It has been some time since last colonoscopy. We had discussed this possibility in clinic. Can you see if he is willing to pursue this?

## 2021-07-27 NOTE — Telephone Encounter (Signed)
Phoned the pt's home number and the phone continuously rang.

## 2021-07-28 NOTE — Telephone Encounter (Signed)
Pt returned the call and was advised of his lab results and the possibility of having a colonoscopy. The pt is undecided or not interested. He said to call Dr. Brigitte Pulse and see what he says?????

## 2021-07-28 NOTE — Telephone Encounter (Signed)
Phoned and LM for the pt to call the office

## 2021-08-04 DIAGNOSIS — H40052 Ocular hypertension, left eye: Secondary | ICD-10-CM | POA: Diagnosis not present

## 2021-08-15 DIAGNOSIS — H40212 Acute angle-closure glaucoma, left eye: Secondary | ICD-10-CM | POA: Diagnosis not present

## 2021-08-15 DIAGNOSIS — Z299 Encounter for prophylactic measures, unspecified: Secondary | ICD-10-CM | POA: Diagnosis not present

## 2021-08-15 DIAGNOSIS — J040 Acute laryngitis: Secondary | ICD-10-CM | POA: Diagnosis not present

## 2021-08-15 DIAGNOSIS — Z2821 Immunization not carried out because of patient refusal: Secondary | ICD-10-CM | POA: Diagnosis not present

## 2021-08-15 DIAGNOSIS — K219 Gastro-esophageal reflux disease without esophagitis: Secondary | ICD-10-CM | POA: Diagnosis not present

## 2021-08-15 DIAGNOSIS — I10 Essential (primary) hypertension: Secondary | ICD-10-CM | POA: Diagnosis not present

## 2021-09-12 DIAGNOSIS — H40052 Ocular hypertension, left eye: Secondary | ICD-10-CM | POA: Diagnosis not present

## 2021-12-06 DIAGNOSIS — H35372 Puckering of macula, left eye: Secondary | ICD-10-CM | POA: Diagnosis not present

## 2021-12-06 DIAGNOSIS — H524 Presbyopia: Secondary | ICD-10-CM | POA: Diagnosis not present

## 2021-12-14 DIAGNOSIS — I739 Peripheral vascular disease, unspecified: Secondary | ICD-10-CM | POA: Diagnosis not present

## 2021-12-14 DIAGNOSIS — Z6824 Body mass index (BMI) 24.0-24.9, adult: Secondary | ICD-10-CM | POA: Diagnosis not present

## 2021-12-14 DIAGNOSIS — Z789 Other specified health status: Secondary | ICD-10-CM | POA: Diagnosis not present

## 2021-12-14 DIAGNOSIS — N183 Chronic kidney disease, stage 3 unspecified: Secondary | ICD-10-CM | POA: Diagnosis not present

## 2021-12-14 DIAGNOSIS — I10 Essential (primary) hypertension: Secondary | ICD-10-CM | POA: Diagnosis not present

## 2021-12-14 DIAGNOSIS — Z299 Encounter for prophylactic measures, unspecified: Secondary | ICD-10-CM | POA: Diagnosis not present

## 2022-02-13 DIAGNOSIS — Z0000 Encounter for general adult medical examination without abnormal findings: Secondary | ICD-10-CM | POA: Diagnosis not present

## 2022-02-13 DIAGNOSIS — I10 Essential (primary) hypertension: Secondary | ICD-10-CM | POA: Diagnosis not present

## 2022-02-13 DIAGNOSIS — Z299 Encounter for prophylactic measures, unspecified: Secondary | ICD-10-CM | POA: Diagnosis not present

## 2022-02-13 DIAGNOSIS — Z713 Dietary counseling and surveillance: Secondary | ICD-10-CM | POA: Diagnosis not present

## 2022-04-17 DIAGNOSIS — Z299 Encounter for prophylactic measures, unspecified: Secondary | ICD-10-CM | POA: Diagnosis not present

## 2022-04-17 DIAGNOSIS — Z713 Dietary counseling and surveillance: Secondary | ICD-10-CM | POA: Diagnosis not present

## 2022-04-17 DIAGNOSIS — Z6824 Body mass index (BMI) 24.0-24.9, adult: Secondary | ICD-10-CM | POA: Diagnosis not present

## 2022-04-17 DIAGNOSIS — I10 Essential (primary) hypertension: Secondary | ICD-10-CM | POA: Diagnosis not present

## 2022-05-01 DIAGNOSIS — I10 Essential (primary) hypertension: Secondary | ICD-10-CM | POA: Diagnosis not present

## 2022-05-01 DIAGNOSIS — Z299 Encounter for prophylactic measures, unspecified: Secondary | ICD-10-CM | POA: Diagnosis not present

## 2022-05-01 DIAGNOSIS — Z713 Dietary counseling and surveillance: Secondary | ICD-10-CM | POA: Diagnosis not present

## 2022-05-01 DIAGNOSIS — Z6824 Body mass index (BMI) 24.0-24.9, adult: Secondary | ICD-10-CM | POA: Diagnosis not present

## 2022-06-14 DIAGNOSIS — E7800 Pure hypercholesterolemia, unspecified: Secondary | ICD-10-CM | POA: Diagnosis not present

## 2022-06-14 DIAGNOSIS — Z7189 Other specified counseling: Secondary | ICD-10-CM | POA: Diagnosis not present

## 2022-06-14 DIAGNOSIS — Z6824 Body mass index (BMI) 24.0-24.9, adult: Secondary | ICD-10-CM | POA: Diagnosis not present

## 2022-06-14 DIAGNOSIS — Z0000 Encounter for general adult medical examination without abnormal findings: Secondary | ICD-10-CM | POA: Diagnosis not present

## 2022-06-14 DIAGNOSIS — Z79899 Other long term (current) drug therapy: Secondary | ICD-10-CM | POA: Diagnosis not present

## 2022-06-14 DIAGNOSIS — R5383 Other fatigue: Secondary | ICD-10-CM | POA: Diagnosis not present

## 2022-06-14 DIAGNOSIS — Z299 Encounter for prophylactic measures, unspecified: Secondary | ICD-10-CM | POA: Diagnosis not present

## 2022-07-05 DIAGNOSIS — Z299 Encounter for prophylactic measures, unspecified: Secondary | ICD-10-CM | POA: Diagnosis not present

## 2022-07-05 DIAGNOSIS — I10 Essential (primary) hypertension: Secondary | ICD-10-CM | POA: Diagnosis not present

## 2022-07-05 DIAGNOSIS — Z6824 Body mass index (BMI) 24.0-24.9, adult: Secondary | ICD-10-CM | POA: Diagnosis not present

## 2022-09-20 DIAGNOSIS — I10 Essential (primary) hypertension: Secondary | ICD-10-CM | POA: Diagnosis not present

## 2022-09-20 DIAGNOSIS — K529 Noninfective gastroenteritis and colitis, unspecified: Secondary | ICD-10-CM | POA: Diagnosis not present

## 2022-09-20 DIAGNOSIS — Z2821 Immunization not carried out because of patient refusal: Secondary | ICD-10-CM | POA: Diagnosis not present

## 2022-09-20 DIAGNOSIS — E7800 Pure hypercholesterolemia, unspecified: Secondary | ICD-10-CM | POA: Diagnosis not present

## 2022-09-20 DIAGNOSIS — Z299 Encounter for prophylactic measures, unspecified: Secondary | ICD-10-CM | POA: Diagnosis not present

## 2022-11-18 IMAGING — DX DG CHEST 1V PORT
1 series · 1 of 1 positions shown · non-contrast
Comparison: 11/28/2018

CLINICAL DATA: Sepsis

EXAM:
PORTABLE CHEST 1 VIEW

[chest ap]
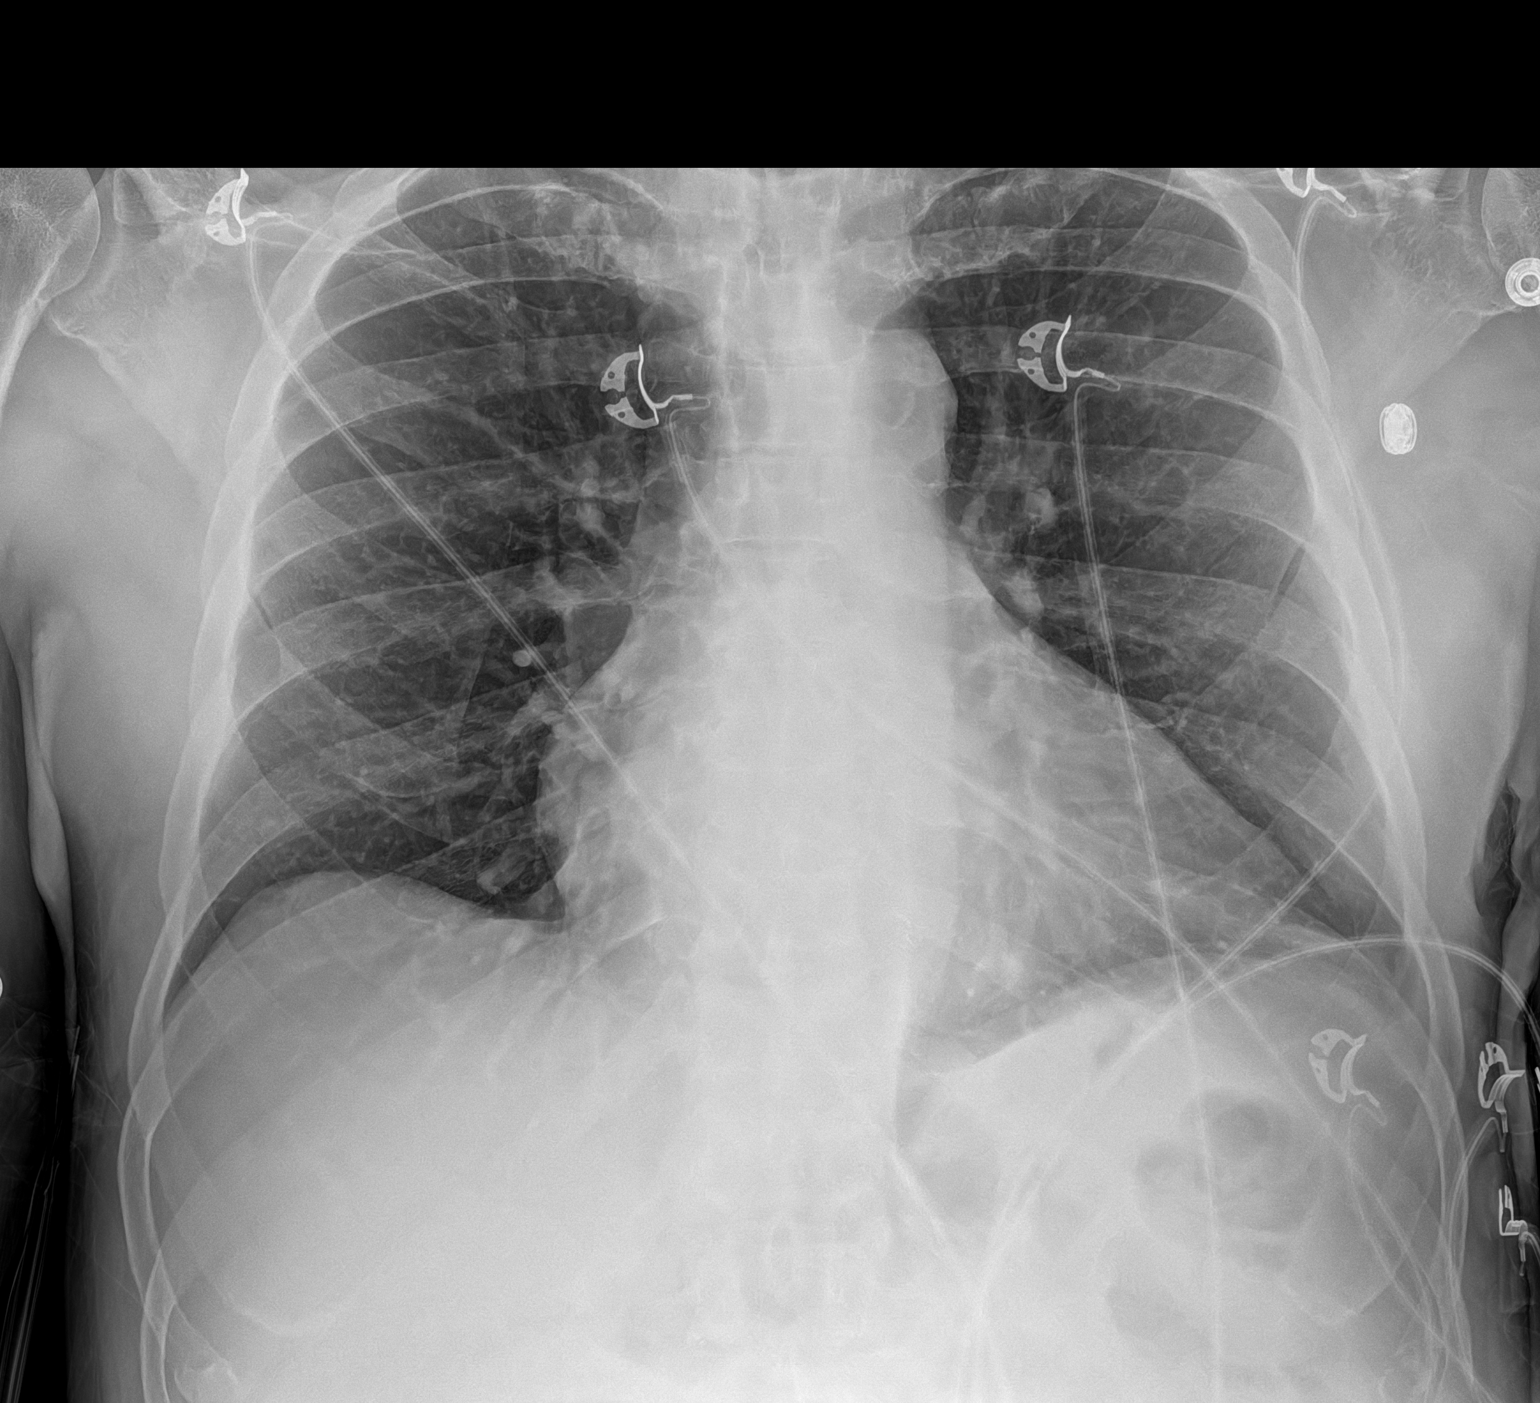

[1 of 1 positions shown; findings below may reference images not displayed]

FINDINGS: The heart size and mediastinal contours are within normal limits.
Both lungs are clear. The visualized skeletal structures are
unremarkable.
IMPRESSION: No active disease.

## 2022-11-18 IMAGING — CT CT CHEST-ABD-PELV W/ CM
2 of 5 series · 14 of 36 positions shown, 16 images · IV contrast (omnipaque)
Comparison: CT 02/09/2016

CLINICAL DATA: Abdomen pain fever and leukocytosis

EXAM:
CT CHEST, ABDOMEN, AND PELVIS WITH CONTRAST
TECHNIQUE: Multidetector CT imaging of the chest, abdomen and pelvis was
performed following the standard protocol during bolus
administration of intravenous contrast.
CONTRAST:  75mL OMNIPAQUE IOHEXOL 300 MG/ML  SOLN

[Series 2: cap with · axial · 0.67mm/px · z∈[+746,+1286]mm · 11 of 130 slices shown, 13 images]
[im 11/130  mediastinal]
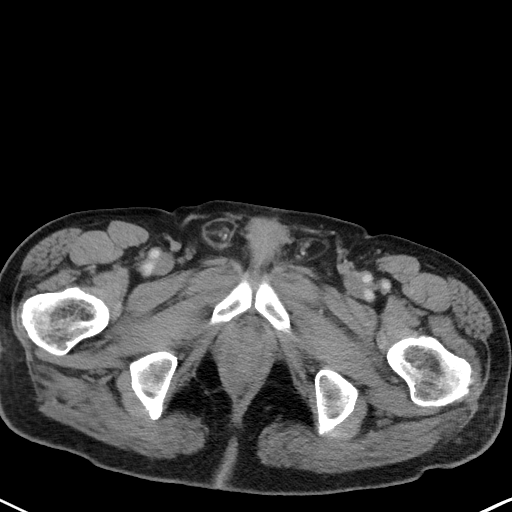
[im 11/130  bone]
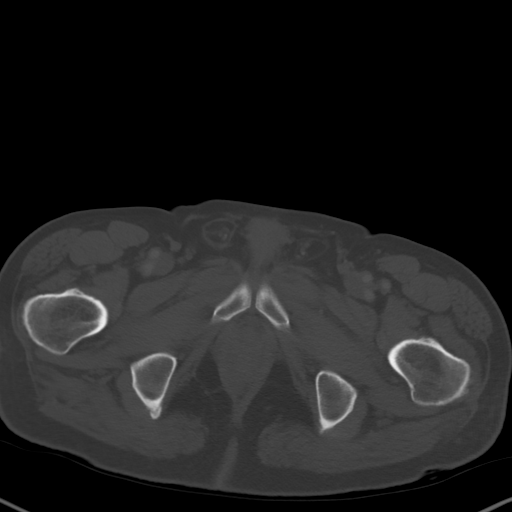
[im 22/130  mediastinal]
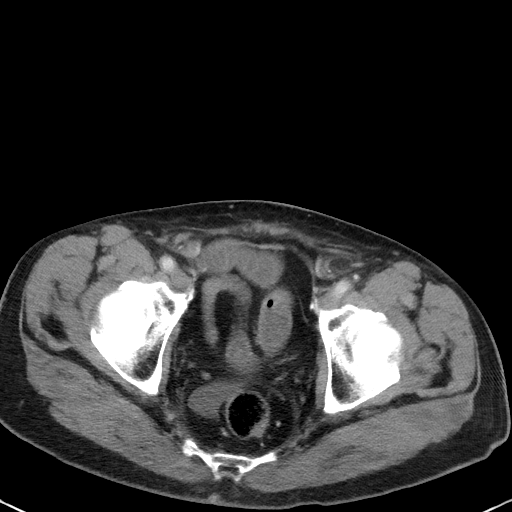
[im 33/130  mediastinal]
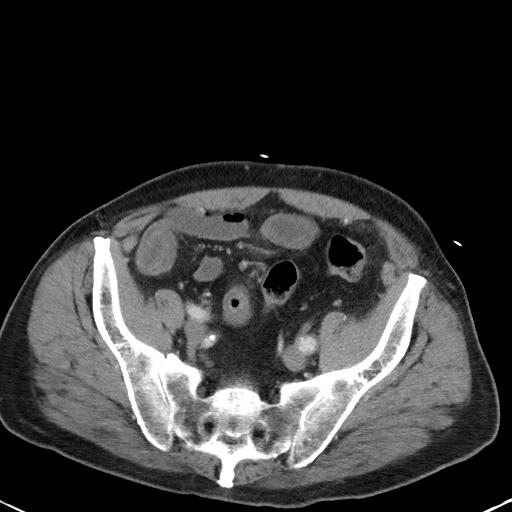
[im 44/130  mediastinal]
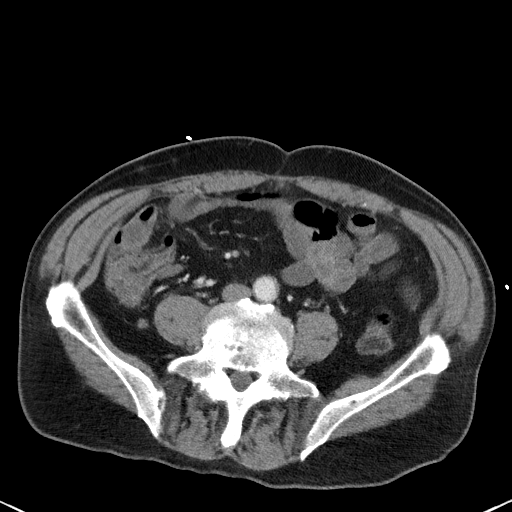
[im 54/130  mediastinal]
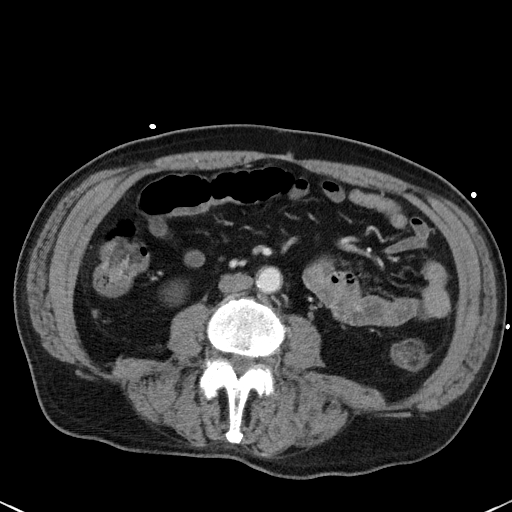
[im 65/130  mediastinal]
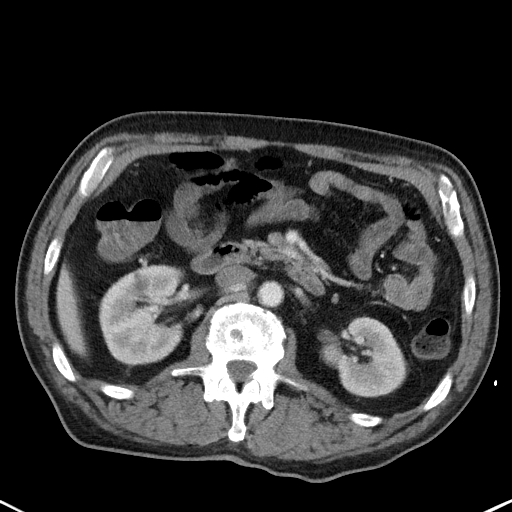
[im 76/130  mediastinal]
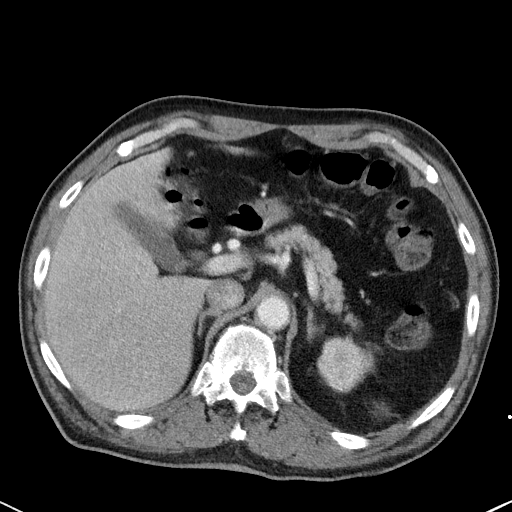
[im 87/130  mediastinal]
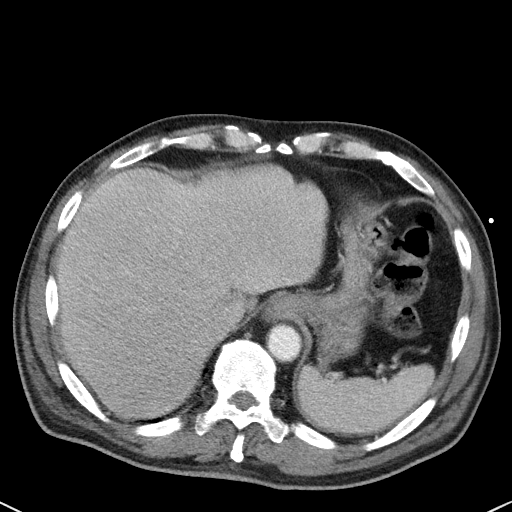
[im 97/130  mediastinal]
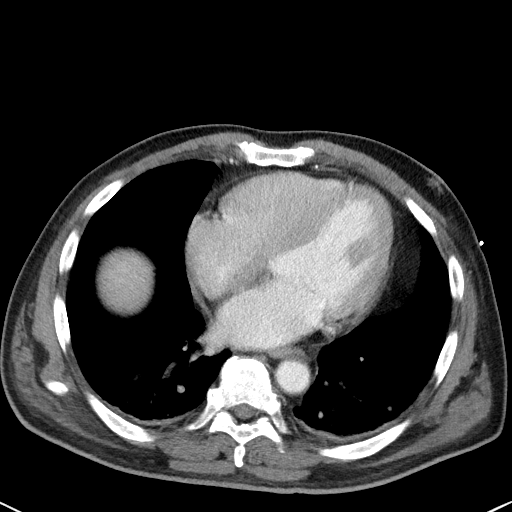
[im 97/130  bone]
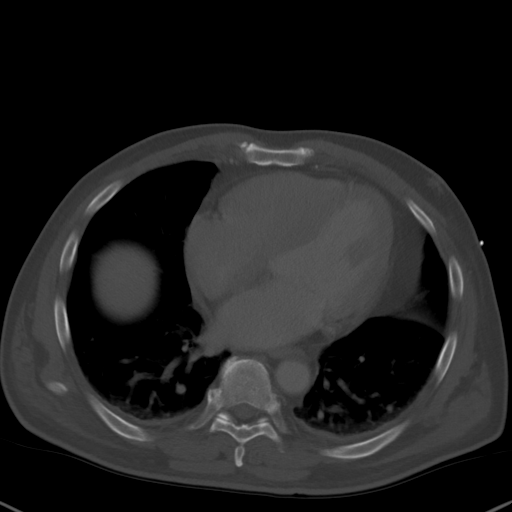
[im 108/130  mediastinal]
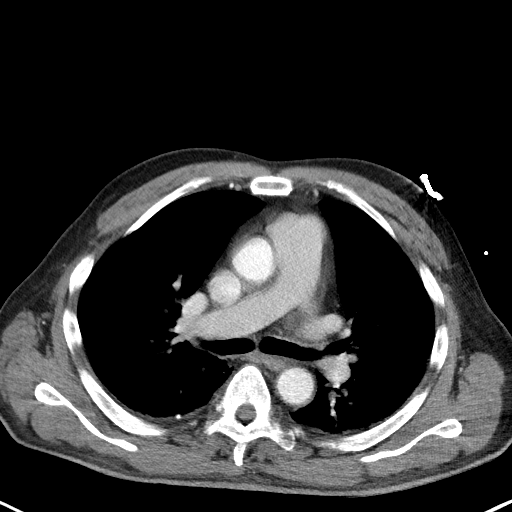
[im 119/130  mediastinal]
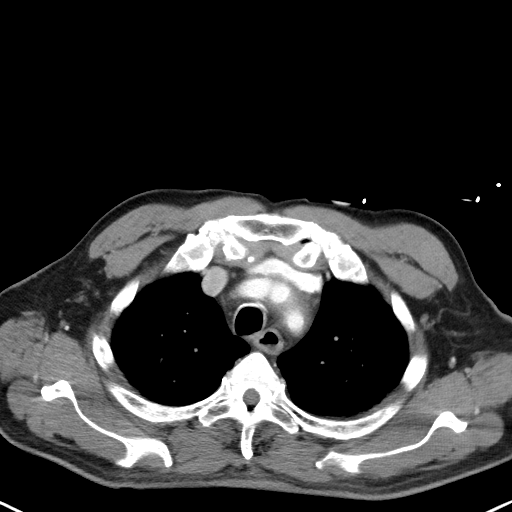

[Series 4: coronals · coronal · 0.76mm/px · 3 of 129 slices shown]
[im 26/129  mediastinal]
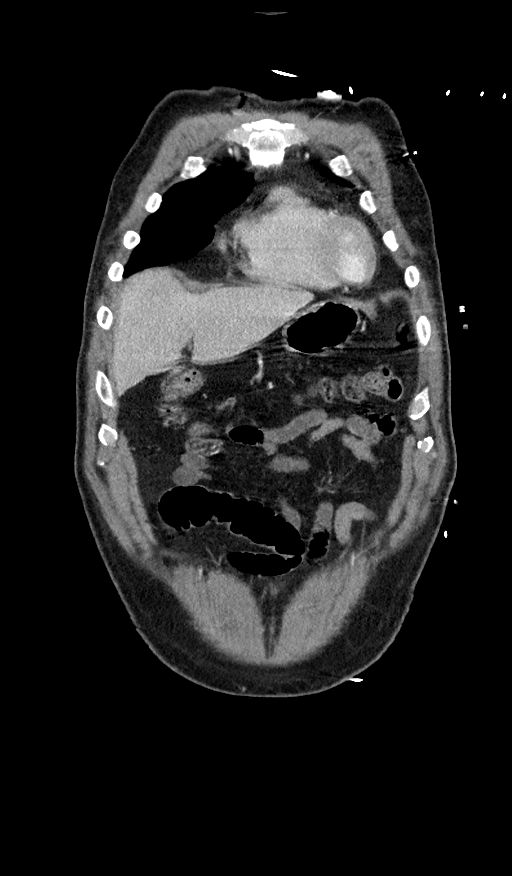
[im 52/129  mediastinal]
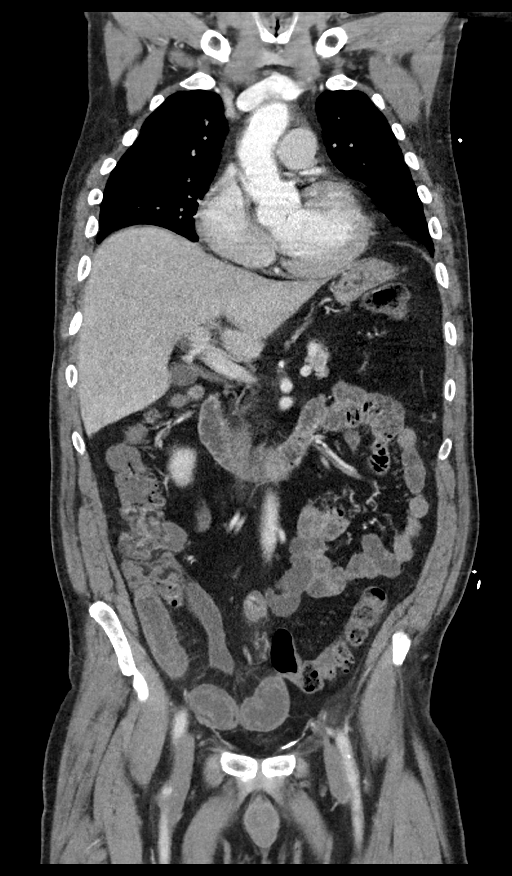
[im 77/129  mediastinal]
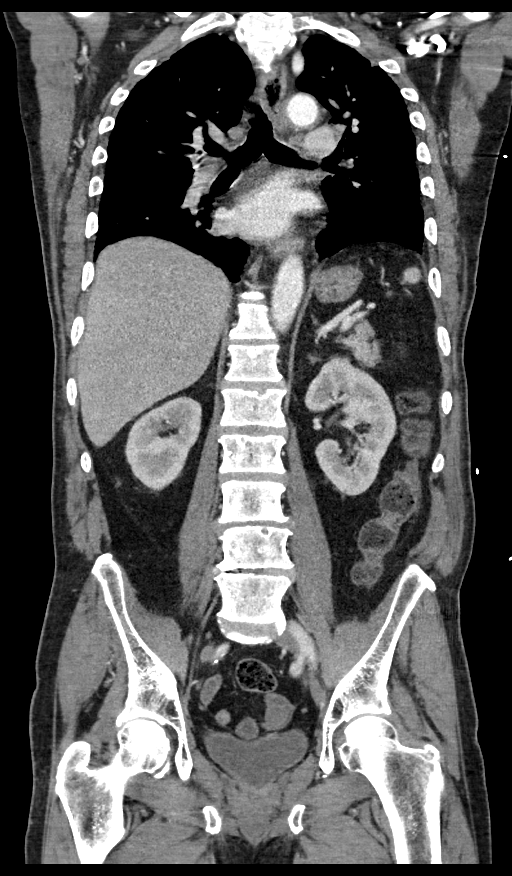

[14 of 36 positions shown; findings below may reference images not displayed]

FINDINGS: CT CHEST FINDINGS

Cardiovascular: Mild aortic atherosclerosis. Indistinct wall
thickening at the great vessel origins aortic arch and proximal
descending aorta. No dissection is seen. There is no aneurysmal
formation. Mild aortic atherosclerosis. Cardiomegaly. Common origin
of the left common carotid artery and brachiocephalic artery.
Cardiomegaly. Trace pericardial effusion.

Mediastinum/Nodes: Midline trachea. No thyroid mass. No suspicious
adenopathy. Esophagus within normal limits

Lungs/Pleura: No consolidation or pneumothorax. No significant
pleural effusion

Musculoskeletal: No acute or suspicious osseous abnormality.

CT ABDOMEN PELVIS FINDINGS

Hepatobiliary: Subcentimeter hypodensity within the anterior liver
too small to further characterize. No calcified gallstone or biliary
dilatation.

Pancreas: Unremarkable. No pancreatic ductal dilatation or
surrounding inflammatory changes.

Spleen: Normal in size without focal abnormality.

Adrenals/Urinary Tract: Adrenal glands within normal limits. Kidneys
show no hydronephrosis. Cyst in the right kidney. The bladder is
unremarkable

Stomach/Bowel: The stomach is nonenlarged. Negative appendix. Wall
thickening and inflammatory change involving distal small bowel in
the pelvis concerning for enteritis.

Vascular/Lymphatic: Nonaneurysmal aorta. Mild aortic
atherosclerosis. Possible mild mural thickening of the abdominal
aorta, external iliac vessels and common femoral vessels. No
suspicious nodes.

Reproductive: Status post prostatectomy.

Other: Small free fluid in the pelvis.  No free air.

Musculoskeletal: No acute or suspicious osseous abnormality
IMPRESSION: 1. Prominent wall thickening involving distal small bowel in the
pelvis with associated inflammatory changes, consistent with
enteritis of infectious, inflammatory, or ischemic etiology.
2. Abnormal appearance of the aortic arch and proximal descending
thoracic aorta which demonstrates indistinct mural thickening and
surrounding inflammatory change, suspicious for aortitis/vasculitis.
This could be secondary to infectious etiologies as well as
autoimmune disease. There is no aneurysmal dilatation at this time.
3. Small free fluid in the pelvis

## 2022-11-19 IMAGING — US US CAROTID DUPLEX BILAT
1 series · 13 of 24 positions shown · non-contrast
Comparison: Carotid Doppler ultrasound-02/10/2016; CT the chest,
abdomen and pelvis-02/24/2021

CLINICAL DATA: Vasculitis questioned on previous CT. History of
hypertension and hyperlipidemia. Former smoker.

EXAM:
BILATERAL CAROTID DUPLEX ULTRASOUND
TECHNIQUE: Gray scale imaging, color Doppler and duplex ultrasound were
performed of bilateral carotid and vertebral arteries in the neck.

[Series 1: us carotid bilateral · 13 of 68 slices shown]
[im 1/68]
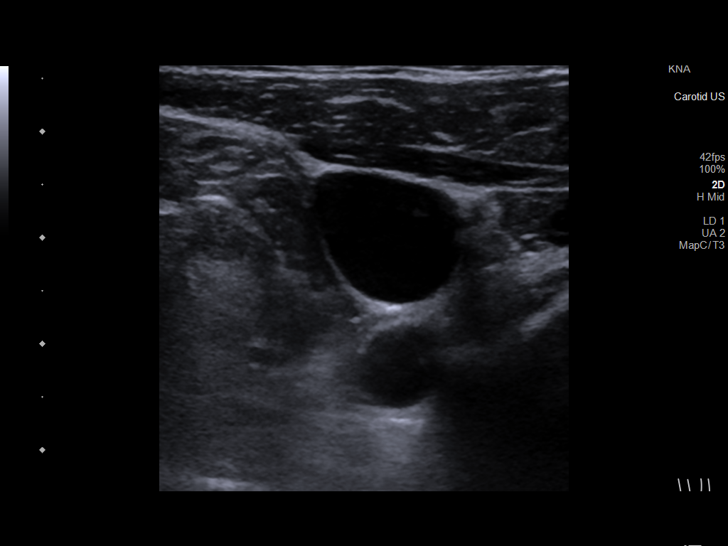
[im 6/68]
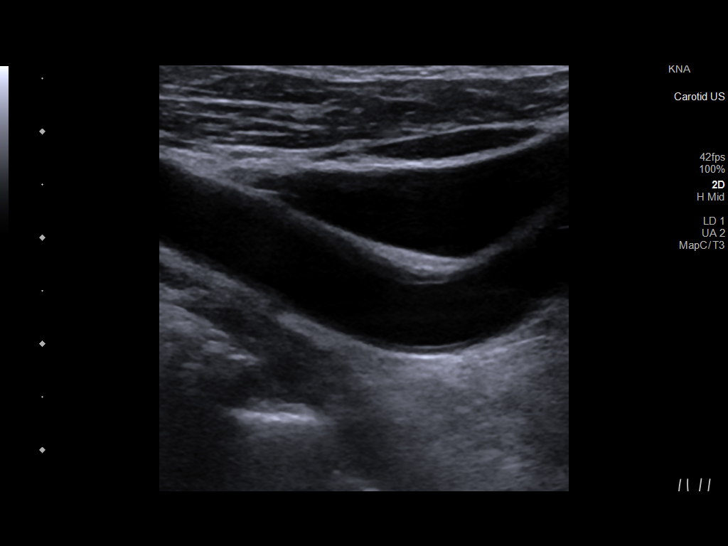
[im 12/68]
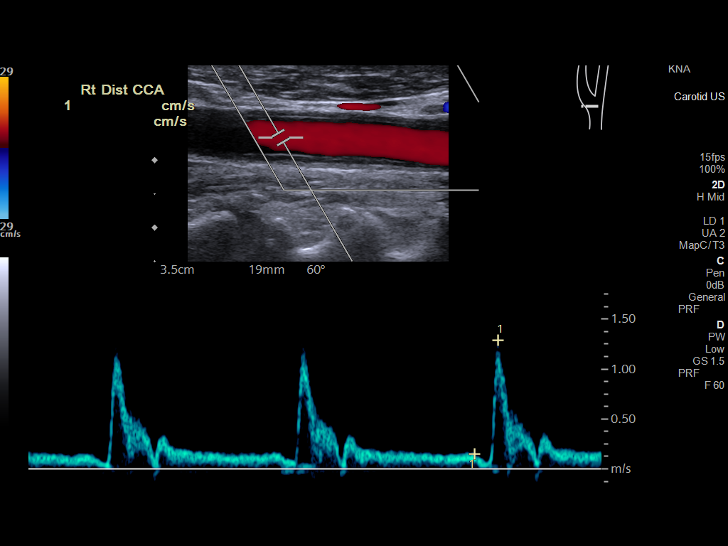
[im 18/68]
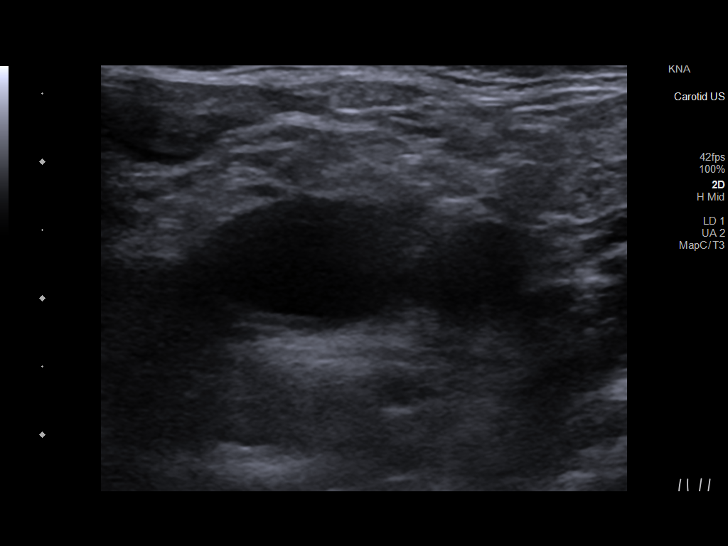
[im 24/68]
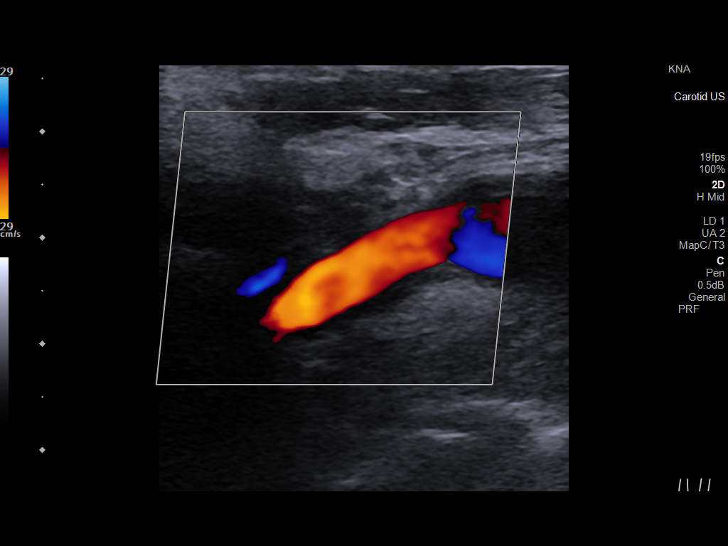
[im 30/68]
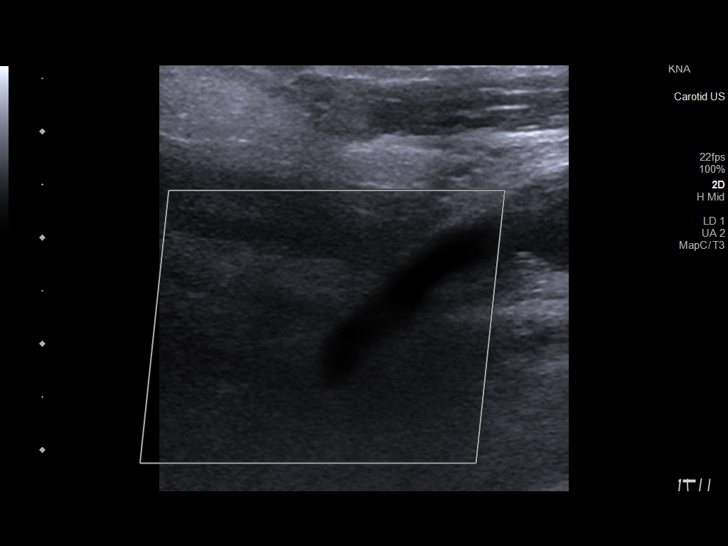
[im 35/68]
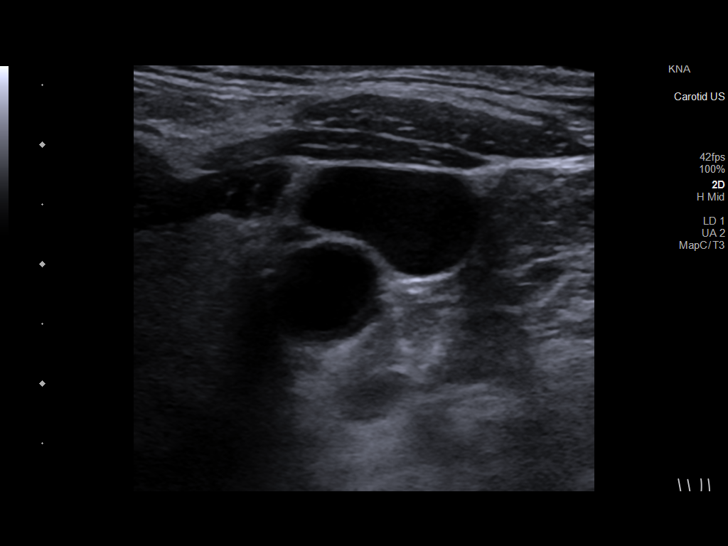
[im 38/68]
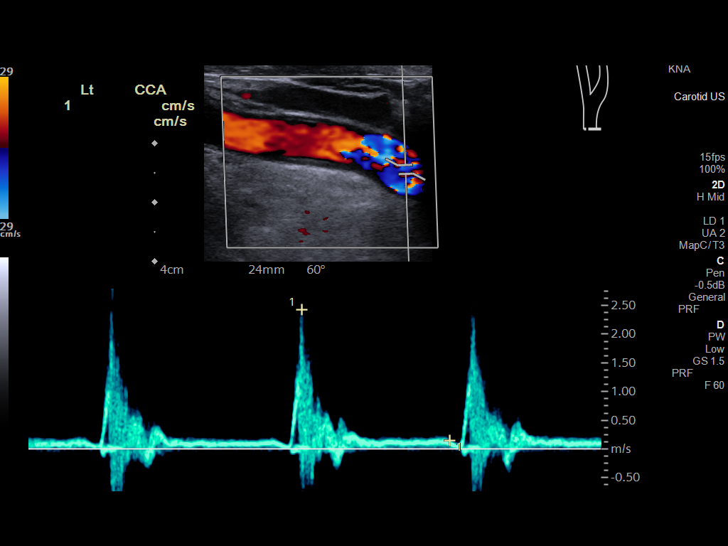
[im 44/68]
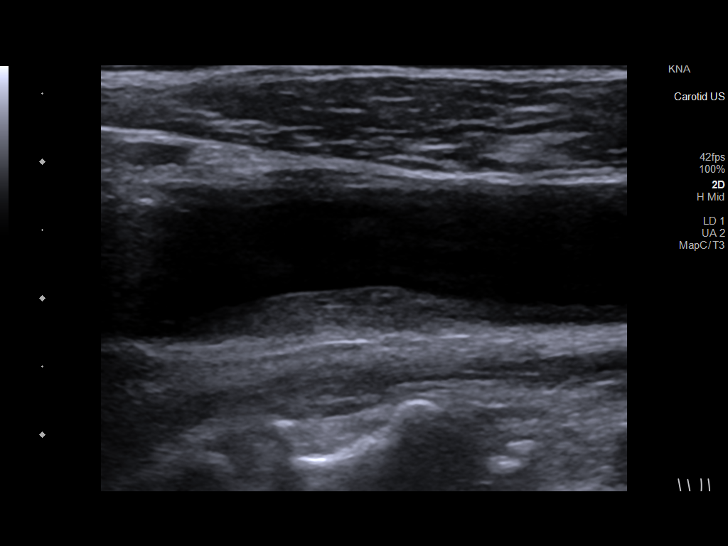
[im 50/68]
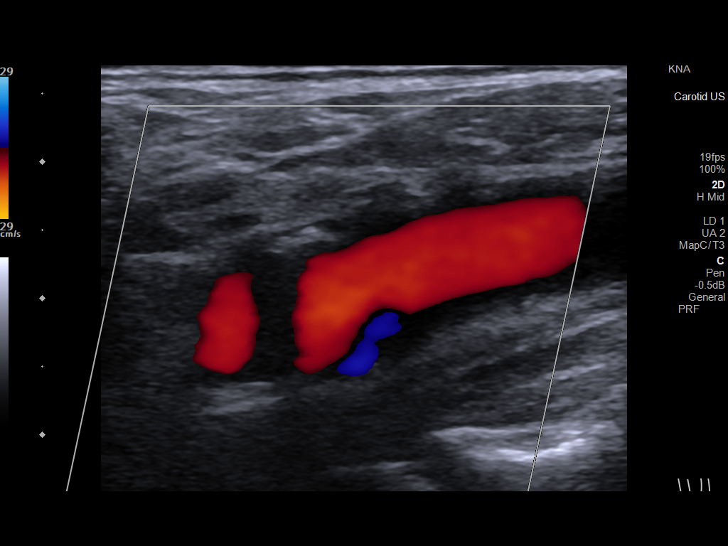
[im 56/68]
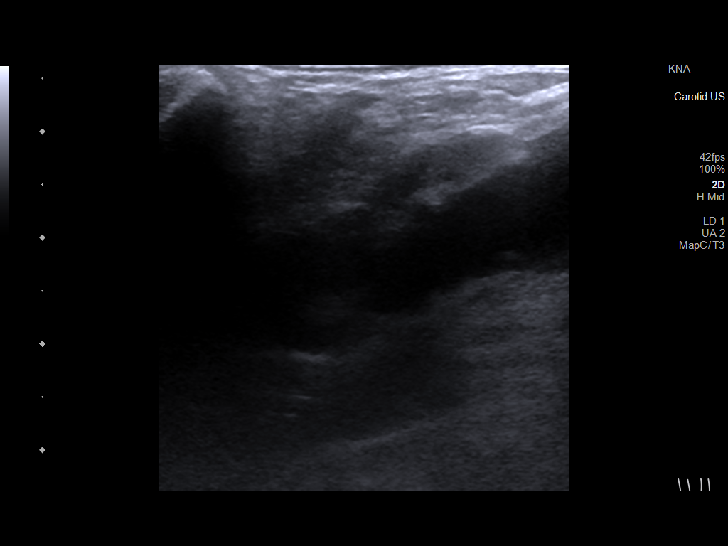
[im 62/68]
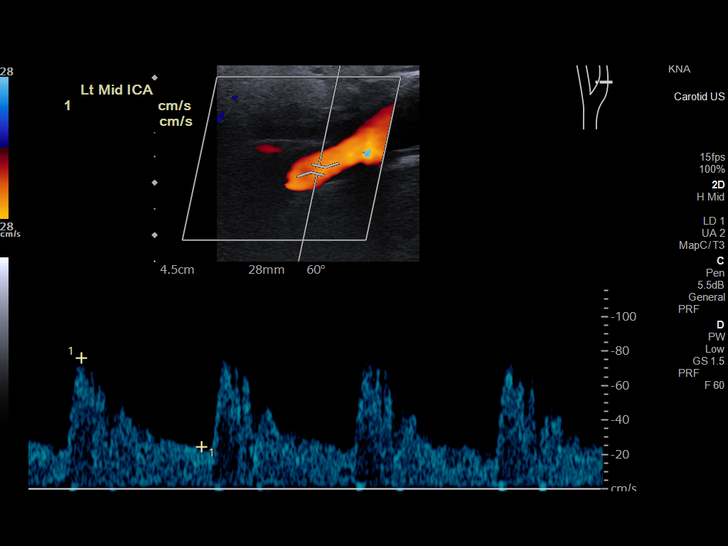
[im 68/68]
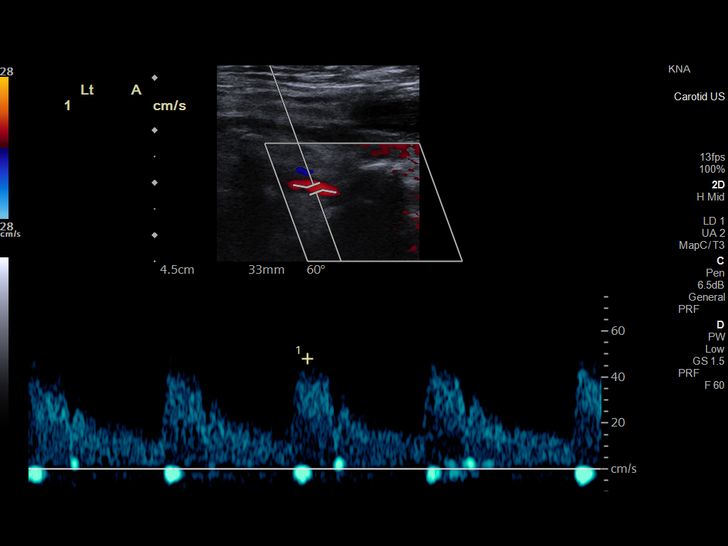

[13 of 24 positions shown; findings below may reference images not displayed]

FINDINGS: Criteria: Quantification of carotid stenosis is based on velocity
parameters that correlate the residual internal carotid diameter
with NASCET-based stenosis levels, using the diameter of the distal
internal carotid lumen as the denominator for stenosis measurement.

The following velocity measurements were obtained:

RIGHT

ICA: 80/19 cm/sec

CCA: 135/11 cm/sec

SYSTOLIC ICA/CCA RATIO:

ECA: 104 cm/sec

LEFT

ICA: 91/24 cm/sec

CCA: 97/10 cm/sec

SYSTOLIC ICA/CCA RATIO:

ECA: 110 cm/sec

RIGHT CAROTID ARTERY: There is mild tortuosity involving the right
common carotid artery (images 4 and 8). There is a moderate amount
of eccentric mixed echogenic plaque within the right carotid bulb
(image 14 and 16), extending to involve the origin and proximal
aspects of the right internal carotid artery (image 24), progressed
compared to the 9662 examination though not resulting in elevated
peak systolic velocities within the interrogated course of the right
internal carotid artery to suggest a hemodynamically significant
stenosis. No discrete areas of vessel irregularity or abnormal wall
thickening.

RIGHT VERTEBRAL ARTERY:  Antegrade flow

LEFT CAROTID ARTERY: There is a moderate amount of eccentric
hypoechoic plaque involving the distal aspect the left common
carotid artery (image 44 and 45). There is a moderate amount of
eccentric echogenic partially shadowing plaque within the left
carotid bulb (image 50), extending to involve the origin and
proximal aspects of the left internal carotid artery (image 58),
unchanged to slightly progressed compared to the 9662 examination
though not resulting in elevated peak systolic velocities within the
interrogated course of the left internal carotid artery to suggest a
hemodynamically significant stenosis. No discrete areas of vessel
irregularity or thickening.

LEFT VERTEBRAL ARTERY:  Antegrade flow
IMPRESSION: 1. Moderate amount of bilateral atherosclerotic plaque, unchanged to
slightly progressed compared to remote examination performed in
9662, though not resulting in a hemodynamically significant stenosis
within either internal carotid artery.
2. No sonographic evidence of vessel wall irregularity or thickening
to suggest a vasculitis affecting the carotid arteries. Further
evaluation with CTA could be performed as indicated.

## 2022-11-20 IMAGING — US US ABDOMEN LIMITED RUQ/ASCITES
1 series · 14 of 25 positions shown · non-contrast
Comparison: None.

CLINICAL DATA: Elevated LFTs

EXAM:
ULTRASOUND ABDOMEN LIMITED RIGHT UPPER QUADRANT

[Series 1: us abdomen limited ruq (liver/gb) · 14 of 54 slices shown]
[im 1/54]
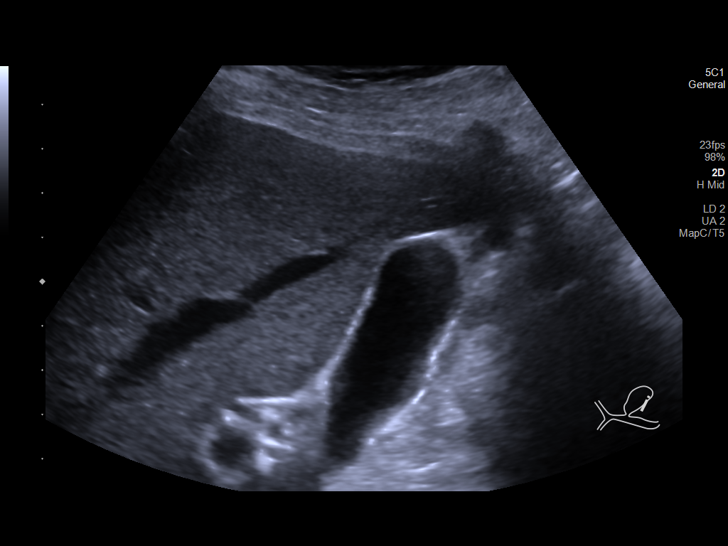
[im 5/54]
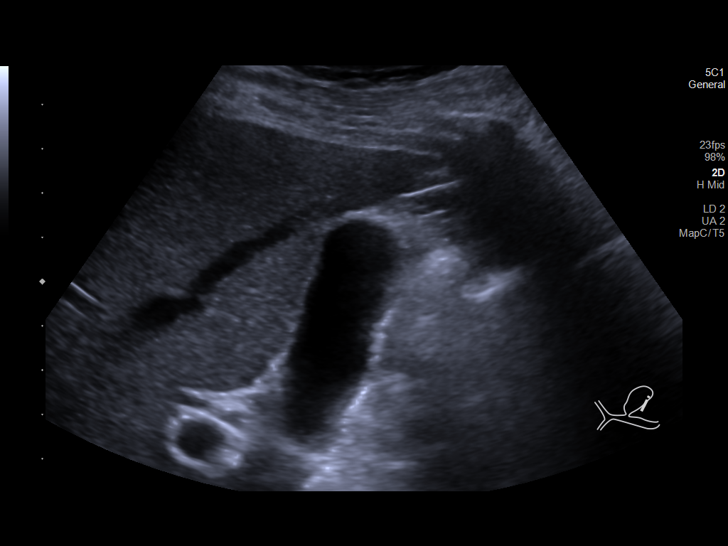
[im 9/54]
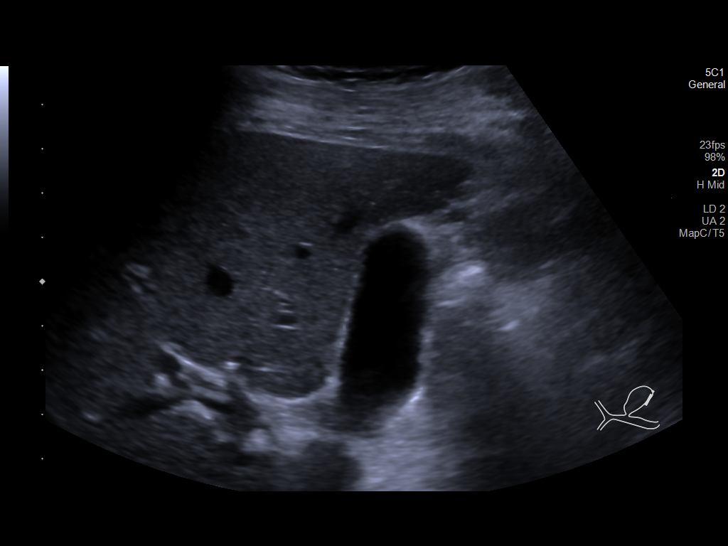
[im 14/54]
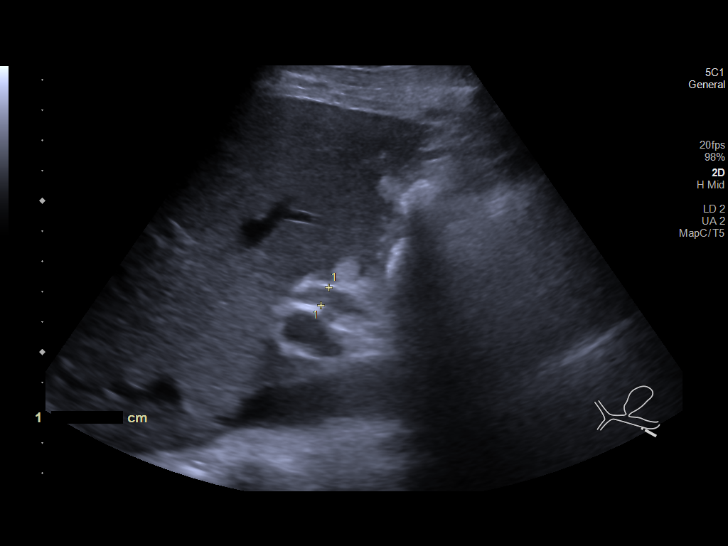
[im 18/54]
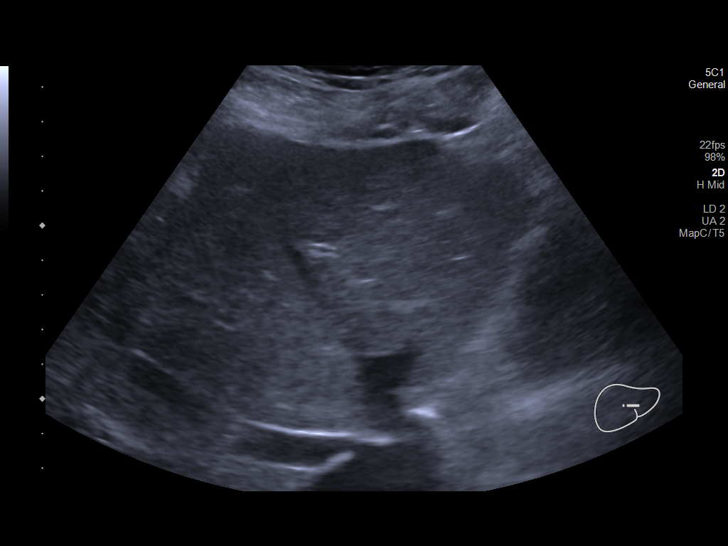
[im 20/54]
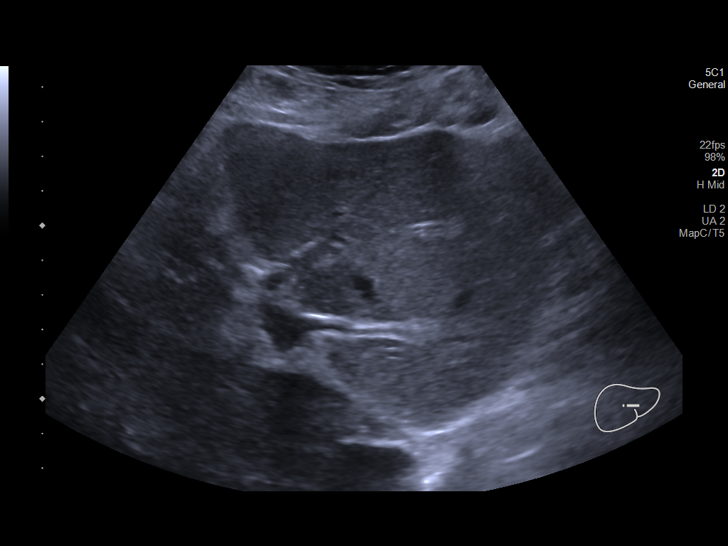
[im 25/54]
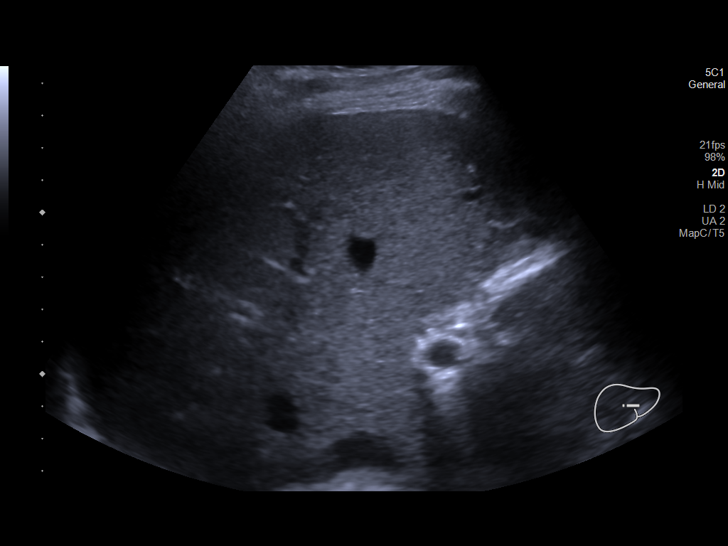
[im 29/54]
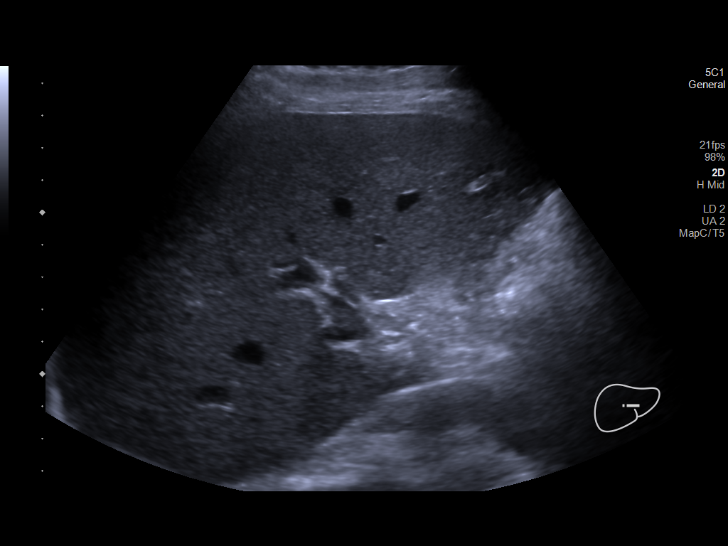
[im 34/54]
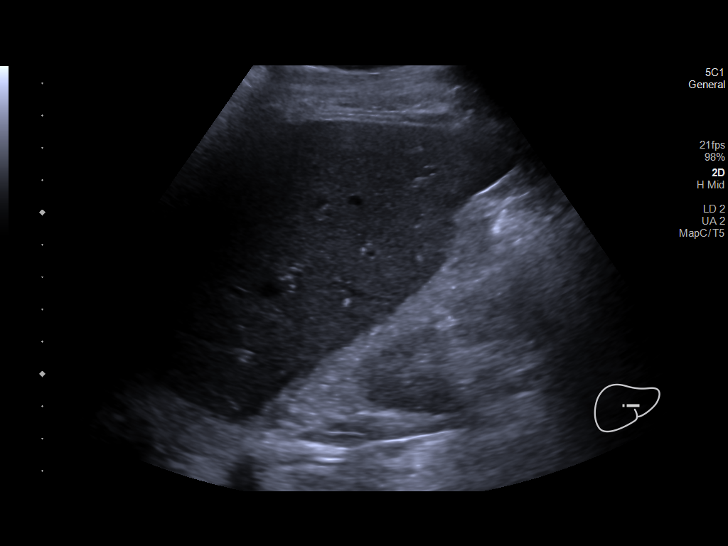
[im 36/54]
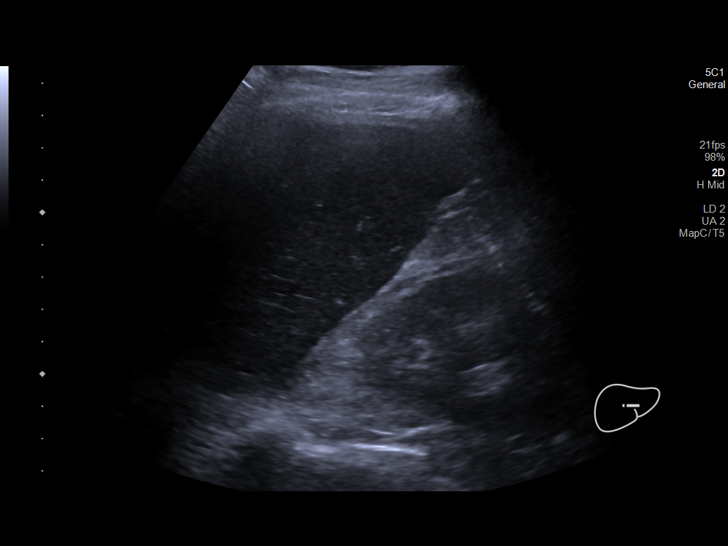
[im 40/54]
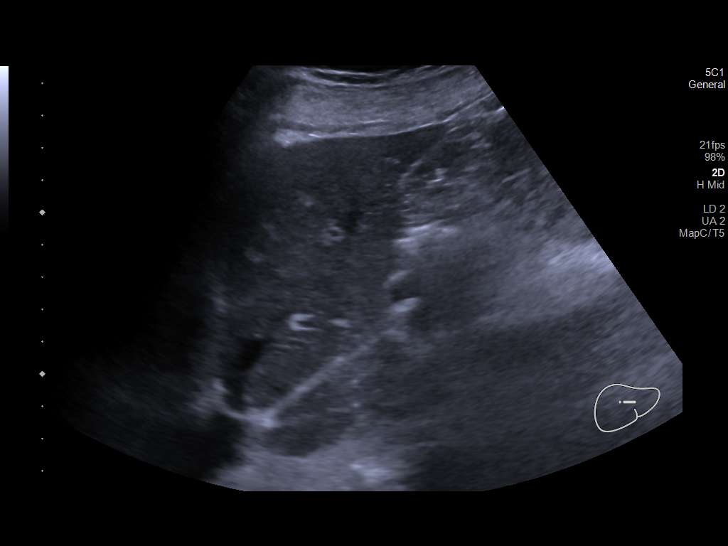
[im 45/54]
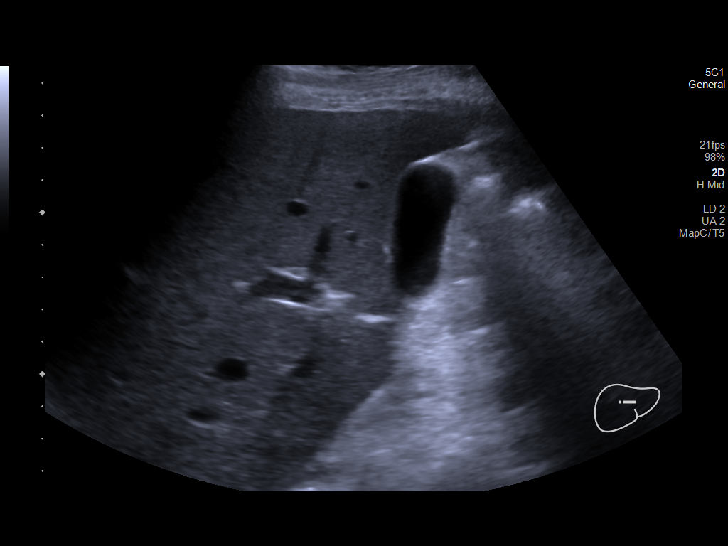
[im 49/54]
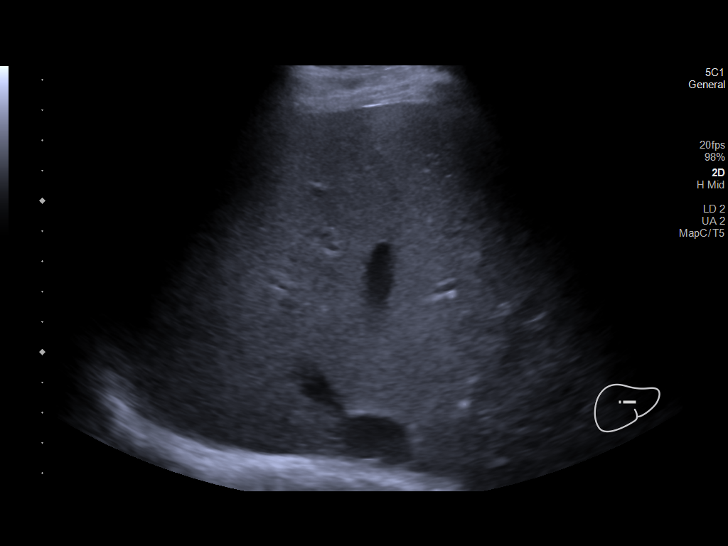
[im 54/54]
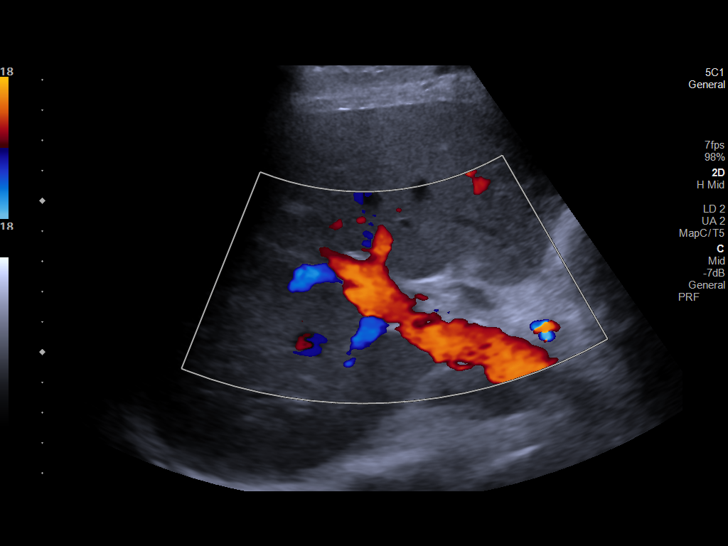

[14 of 25 positions shown; findings below may reference images not displayed]

FINDINGS: Gallbladder:

No gallstones or wall thickening visualized. No sonographic Murphy
sign noted by sonographer.

Common bile duct:

Diameter: Normal at 6 mm

Liver:

No focal lesion identified. Within normal limits in parenchymal
echogenicity. Portal vein is patent on color Doppler imaging with
normal direction of blood flow towards the liver.

Other: None.
IMPRESSION: Unremarkable right upper quadrant ultrasound.

## 2022-11-22 DIAGNOSIS — E7800 Pure hypercholesterolemia, unspecified: Secondary | ICD-10-CM | POA: Diagnosis not present

## 2022-11-22 DIAGNOSIS — K219 Gastro-esophageal reflux disease without esophagitis: Secondary | ICD-10-CM | POA: Diagnosis not present

## 2022-11-22 DIAGNOSIS — I10 Essential (primary) hypertension: Secondary | ICD-10-CM | POA: Diagnosis not present

## 2022-12-18 DIAGNOSIS — Z299 Encounter for prophylactic measures, unspecified: Secondary | ICD-10-CM | POA: Diagnosis not present

## 2022-12-18 DIAGNOSIS — E7800 Pure hypercholesterolemia, unspecified: Secondary | ICD-10-CM | POA: Diagnosis not present

## 2022-12-18 DIAGNOSIS — I739 Peripheral vascular disease, unspecified: Secondary | ICD-10-CM | POA: Diagnosis not present

## 2022-12-18 DIAGNOSIS — I10 Essential (primary) hypertension: Secondary | ICD-10-CM | POA: Diagnosis not present

## 2022-12-18 DIAGNOSIS — R0981 Nasal congestion: Secondary | ICD-10-CM | POA: Diagnosis not present

## 2023-02-19 ENCOUNTER — Other Ambulatory Visit: Payer: Self-pay

## 2023-02-19 ENCOUNTER — Encounter (HOSPITAL_COMMUNITY): Payer: Self-pay

## 2023-02-19 ENCOUNTER — Inpatient Hospital Stay (HOSPITAL_COMMUNITY)
Admission: EM | Admit: 2023-02-19 | Discharge: 2023-02-21 | DRG: 352 | Disposition: A | Discharge status: Home / Self Care | Payer: Medicare Other | Attending: Family Medicine | Admitting: Family Medicine

## 2023-02-19 DIAGNOSIS — D75839 Thrombocytosis, unspecified: Secondary | ICD-10-CM | POA: Diagnosis not present

## 2023-02-19 DIAGNOSIS — E876 Hypokalemia: Secondary | ICD-10-CM | POA: Diagnosis not present

## 2023-02-19 DIAGNOSIS — I10 Essential (primary) hypertension: Secondary | ICD-10-CM | POA: Diagnosis not present

## 2023-02-19 DIAGNOSIS — Z8249 Family history of ischemic heart disease and other diseases of the circulatory system: Secondary | ICD-10-CM | POA: Diagnosis not present

## 2023-02-19 DIAGNOSIS — Z833 Family history of diabetes mellitus: Secondary | ICD-10-CM | POA: Diagnosis not present

## 2023-02-19 DIAGNOSIS — K56609 Unspecified intestinal obstruction, unspecified as to partial versus complete obstruction: Secondary | ICD-10-CM

## 2023-02-19 DIAGNOSIS — Z823 Family history of stroke: Secondary | ICD-10-CM | POA: Diagnosis not present

## 2023-02-19 DIAGNOSIS — K219 Gastro-esophageal reflux disease without esophagitis: Secondary | ICD-10-CM | POA: Diagnosis not present

## 2023-02-19 DIAGNOSIS — Z9079 Acquired absence of other genital organ(s): Secondary | ICD-10-CM

## 2023-02-19 DIAGNOSIS — K4030 Unilateral inguinal hernia, with obstruction, without gangrene, not specified as recurrent: Principal | ICD-10-CM

## 2023-02-19 DIAGNOSIS — R109 Unspecified abdominal pain: Secondary | ICD-10-CM | POA: Diagnosis not present

## 2023-02-19 DIAGNOSIS — K460 Unspecified abdominal hernia with obstruction, without gangrene: Secondary | ICD-10-CM | POA: Diagnosis present

## 2023-02-19 DIAGNOSIS — E860 Dehydration: Secondary | ICD-10-CM | POA: Diagnosis present

## 2023-02-19 DIAGNOSIS — R1084 Generalized abdominal pain: Secondary | ICD-10-CM | POA: Diagnosis not present

## 2023-02-19 DIAGNOSIS — N281 Cyst of kidney, acquired: Secondary | ICD-10-CM | POA: Diagnosis not present

## 2023-02-19 DIAGNOSIS — C61 Malignant neoplasm of prostate: Secondary | ICD-10-CM | POA: Diagnosis present

## 2023-02-19 DIAGNOSIS — Z8546 Personal history of malignant neoplasm of prostate: Secondary | ICD-10-CM | POA: Diagnosis not present

## 2023-02-19 DIAGNOSIS — D649 Anemia, unspecified: Secondary | ICD-10-CM | POA: Diagnosis not present

## 2023-02-19 DIAGNOSIS — Z299 Encounter for prophylactic measures, unspecified: Secondary | ICD-10-CM | POA: Diagnosis not present

## 2023-02-19 DIAGNOSIS — D72829 Elevated white blood cell count, unspecified: Secondary | ICD-10-CM | POA: Diagnosis present

## 2023-02-19 DIAGNOSIS — Z87891 Personal history of nicotine dependence: Secondary | ICD-10-CM | POA: Diagnosis not present

## 2023-02-19 DIAGNOSIS — Z79899 Other long term (current) drug therapy: Secondary | ICD-10-CM

## 2023-02-19 DIAGNOSIS — T502X5A Adverse effect of carbonic-anhydrase inhibitors, benzothiadiazides and other diuretics, initial encounter: Secondary | ICD-10-CM | POA: Diagnosis present

## 2023-02-19 DIAGNOSIS — E7800 Pure hypercholesterolemia, unspecified: Secondary | ICD-10-CM | POA: Diagnosis not present

## 2023-02-19 DIAGNOSIS — D72828 Other elevated white blood cell count: Secondary | ICD-10-CM | POA: Diagnosis present

## 2023-02-19 DIAGNOSIS — R52 Pain, unspecified: Secondary | ICD-10-CM | POA: Diagnosis not present

## 2023-02-19 LAB — COMPREHENSIVE METABOLIC PANEL
ALT: 28 U/L (ref 0–44)
AST: 21 U/L (ref 15–41)
Albumin: 4 g/dL (ref 3.5–5.0)
Alkaline Phosphatase: 116 U/L (ref 38–126)
Anion gap: 12 (ref 5–15)
BUN: 23 mg/dL (ref 8–23)
CO2: 27 mmol/L (ref 22–32)
Calcium: 9.7 mg/dL (ref 8.9–10.3)
Chloride: 100 mmol/L (ref 98–111)
Creatinine, Ser: 1.14 mg/dL (ref 0.61–1.24)
GFR, Estimated: >60 mL/min (ref >60)
Glucose, Bld: 169 mg/dL — ABNORMAL HIGH (ref 70–99)
Potassium: 3 mmol/L — ABNORMAL LOW (ref 3.5–5.1)
Sodium: 139 mmol/L (ref 135–145)
Total Bilirubin: 1.6 mg/dL — ABNORMAL HIGH (ref 0.3–1.2)
Total Protein: 8.2 g/dL — ABNORMAL HIGH (ref 6.5–8.1)

## 2023-02-19 LAB — CBC
HCT: 46.2 % (ref 39.0–52.0)
Hemoglobin: 15.4 g/dL (ref 13.0–17.0)
MCH: 29.2 pg (ref 26.0–34.0)
MCHC: 33.3 g/dL (ref 30.0–36.0)
MCV: 87.5 fL (ref 80.0–100.0)
Platelets: 439 10*3/uL — ABNORMAL HIGH (ref 150–400)
RBC: 5.28 MIL/uL (ref 4.22–5.81)
RDW: 13.7 % (ref 11.5–15.5)
WBC: 20.5 10*3/uL — ABNORMAL HIGH (ref 4.0–10.5)
nRBC: 0 % (ref 0.0–0.2)

## 2023-02-19 LAB — LIPASE, BLOOD: Lipase: 27 U/L (ref 11–51)

## 2023-02-19 NOTE — ED Triage Notes (Signed)
Pt arrived from home via [facility] c/o abd pain that started yesterday. Pt went to PCP today and was told to come to the er for a ct scan.Pt vomited x 1. Pain is 10/10 generalized

## 2023-02-20 ENCOUNTER — Encounter (HOSPITAL_COMMUNITY): Payer: Self-pay | Admitting: Family Medicine

## 2023-02-20 ENCOUNTER — Other Ambulatory Visit: Payer: Self-pay

## 2023-02-20 ENCOUNTER — Inpatient Hospital Stay (HOSPITAL_COMMUNITY): Payer: Medicare Other | Admitting: Certified Registered Nurse Anesthetist

## 2023-02-20 ENCOUNTER — Emergency Department (HOSPITAL_COMMUNITY): Payer: Medicare Other

## 2023-02-20 ENCOUNTER — Encounter (HOSPITAL_COMMUNITY)
Admission: EM | Disposition: A | Discharge status: Home / Self Care | Payer: Self-pay | Source: Home / Self Care | Attending: Family Medicine

## 2023-02-20 DIAGNOSIS — Z87891 Personal history of nicotine dependence: Secondary | ICD-10-CM | POA: Diagnosis not present

## 2023-02-20 DIAGNOSIS — Z79899 Other long term (current) drug therapy: Secondary | ICD-10-CM | POA: Diagnosis not present

## 2023-02-20 DIAGNOSIS — T502X5A Adverse effect of carbonic-anhydrase inhibitors, benzothiadiazides and other diuretics, initial encounter: Secondary | ICD-10-CM | POA: Diagnosis present

## 2023-02-20 DIAGNOSIS — E7800 Pure hypercholesterolemia, unspecified: Secondary | ICD-10-CM | POA: Diagnosis present

## 2023-02-20 DIAGNOSIS — E876 Hypokalemia: Secondary | ICD-10-CM | POA: Diagnosis present

## 2023-02-20 DIAGNOSIS — D649 Anemia, unspecified: Secondary | ICD-10-CM | POA: Diagnosis not present

## 2023-02-20 DIAGNOSIS — Z823 Family history of stroke: Secondary | ICD-10-CM | POA: Diagnosis not present

## 2023-02-20 DIAGNOSIS — I10 Essential (primary) hypertension: Secondary | ICD-10-CM

## 2023-02-20 DIAGNOSIS — Z9079 Acquired absence of other genital organ(s): Secondary | ICD-10-CM | POA: Diagnosis not present

## 2023-02-20 DIAGNOSIS — Z833 Family history of diabetes mellitus: Secondary | ICD-10-CM | POA: Diagnosis not present

## 2023-02-20 DIAGNOSIS — K460 Unspecified abdominal hernia with obstruction, without gangrene: Secondary | ICD-10-CM

## 2023-02-20 DIAGNOSIS — K4030 Unilateral inguinal hernia, with obstruction, without gangrene, not specified as recurrent: Secondary | ICD-10-CM | POA: Diagnosis present

## 2023-02-20 DIAGNOSIS — Z8249 Family history of ischemic heart disease and other diseases of the circulatory system: Secondary | ICD-10-CM | POA: Diagnosis not present

## 2023-02-20 DIAGNOSIS — Z8546 Personal history of malignant neoplasm of prostate: Secondary | ICD-10-CM | POA: Diagnosis not present

## 2023-02-20 DIAGNOSIS — E860 Dehydration: Secondary | ICD-10-CM | POA: Diagnosis present

## 2023-02-20 DIAGNOSIS — D72828 Other elevated white blood cell count: Secondary | ICD-10-CM | POA: Diagnosis present

## 2023-02-20 DIAGNOSIS — K219 Gastro-esophageal reflux disease without esophagitis: Secondary | ICD-10-CM | POA: Diagnosis present

## 2023-02-20 DIAGNOSIS — D75839 Thrombocytosis, unspecified: Secondary | ICD-10-CM | POA: Diagnosis present

## 2023-02-20 HISTORY — PX: INGUINAL HERNIA REPAIR: SHX194

## 2023-02-20 LAB — PHOSPHORUS: Phosphorus: 4 mg/dL (ref 2.5–4.6)

## 2023-02-20 LAB — MAGNESIUM: Magnesium: 2.3 mg/dL (ref 1.7–2.4)

## 2023-02-20 LAB — GLUCOSE, CAPILLARY: Glucose-Capillary: 106 mg/dL — ABNORMAL HIGH (ref 70–99)

## 2023-02-20 LAB — PROTIME-INR
INR: 1 (ref 0.8–1.2)
Prothrombin Time: 13.3 seconds (ref 11.4–15.2)

## 2023-02-20 LAB — LACTIC ACID, PLASMA: Lactic Acid, Venous: 1.3 mmol/L (ref 0.5–1.9)

## 2023-02-20 SURGERY — REPAIR, HERNIA, INGUINAL, INCARCERATED
Anesthesia: General | Site: Inguinal | Laterality: Right

## 2023-02-20 MED ORDER — HYDROMORPHONE HCL 1 MG/ML IJ SOLN
1.0000 mg | Freq: Once | INTRAMUSCULAR | Status: AC
  Administered 2023-02-20: 1 mg via INTRAVENOUS
  Filled 2023-02-20: qty 1

## 2023-02-20 MED ORDER — FENTANYL CITRATE (PF) 100 MCG/2ML IJ SOLN
INTRAMUSCULAR | Status: AC
  Filled 2023-02-20: qty 2

## 2023-02-20 MED ORDER — ROCURONIUM BROMIDE 10 MG/ML (PF) SYRINGE
PREFILLED_SYRINGE | INTRAVENOUS | Status: AC
  Filled 2023-02-20: qty 30

## 2023-02-20 MED ORDER — SODIUM CHLORIDE 0.9 % IV SOLN
1.0000 g | Freq: Three times a day (TID) | INTRAVENOUS | Status: DC
  Administered 2023-02-20 – 2023-02-21 (×3): 1 g via INTRAVENOUS
  Filled 2023-02-20 (×3): qty 20

## 2023-02-20 MED ORDER — FENTANYL CITRATE PF 50 MCG/ML IJ SOSY
25.0000 ug | PREFILLED_SYRINGE | INTRAMUSCULAR | Status: DC | PRN
  Administered 2023-02-20: 50 ug via INTRAVENOUS
  Filled 2023-02-20: qty 1

## 2023-02-20 MED ORDER — SUGAMMADEX SODIUM 200 MG/2ML IV SOLN
INTRAVENOUS | Status: DC | PRN
  Administered 2023-02-20: 200 mg via INTRAVENOUS

## 2023-02-20 MED ORDER — SODIUM CHLORIDE 0.9 % IV SOLN: 1.0000 g | INTRAVENOUS | Status: DC

## 2023-02-20 MED ORDER — SENNOSIDES-DOCUSATE SODIUM 8.6-50 MG PO TABS: 1.0000 | ORAL_TABLET | Freq: Every evening | ORAL | Status: DC | PRN

## 2023-02-20 MED ORDER — CHLORHEXIDINE GLUCONATE 0.12 % MT SOLN: 15.0000 mL | Freq: Once | OROMUCOSAL | Status: AC

## 2023-02-20 MED ORDER — POTASSIUM CHLORIDE CRYS ER 20 MEQ PO TBCR
20.0000 meq | EXTENDED_RELEASE_TABLET | Freq: Once | ORAL | Status: AC
  Administered 2023-02-20: 20 meq via ORAL
  Filled 2023-02-20: qty 1

## 2023-02-20 MED ORDER — ONDANSETRON HCL 4 MG/2ML IJ SOLN: 4.0000 mg | Freq: Once | INTRAMUSCULAR | Status: DC | PRN

## 2023-02-20 MED ORDER — BUPIVACAINE HCL (PF) 0.5 % IJ SOLN
INTRAMUSCULAR | Status: DC | PRN
  Administered 2023-02-20: 30 mL

## 2023-02-20 MED ORDER — TRAZODONE HCL 50 MG PO TABS: 25.0000 mg | ORAL_TABLET | Freq: Every evening | ORAL | Status: DC | PRN

## 2023-02-20 MED ORDER — FENTANYL CITRATE (PF) 100 MCG/2ML IJ SOLN
INTRAMUSCULAR | Status: DC | PRN
  Administered 2023-02-20: 25 ug via INTRAVENOUS
  Administered 2023-02-20: 50 ug via INTRAVENOUS
  Administered 2023-02-20: 25 ug via INTRAVENOUS
  Administered 2023-02-20: 50 ug via INTRAVENOUS

## 2023-02-20 MED ORDER — CHLORHEXIDINE GLUCONATE CLOTH 2 % EX PADS: 6.0000 | MEDICATED_PAD | Freq: Once | CUTANEOUS | Status: DC

## 2023-02-20 MED ORDER — ORAL CARE MOUTH RINSE: 15.0000 mL | Freq: Once | OROMUCOSAL | Status: AC

## 2023-02-20 MED ORDER — BISACODYL 5 MG PO TBEC: 5.0000 mg | DELAYED_RELEASE_TABLET | Freq: Every day | ORAL | Status: DC | PRN

## 2023-02-20 MED ORDER — LIDOCAINE HCL (PF) 2 % IJ SOLN
INTRAMUSCULAR | Status: AC
  Filled 2023-02-20: qty 5

## 2023-02-20 MED ORDER — SODIUM CHLORIDE 0.9% FLUSH: 3.0000 mL | Freq: Two times a day (BID) | INTRAVENOUS | Status: DC

## 2023-02-20 MED ORDER — ONDANSETRON HCL 4 MG/2ML IJ SOLN
INTRAMUSCULAR | Status: DC | PRN
  Administered 2023-02-20: 4 mg via INTRAVENOUS

## 2023-02-20 MED ORDER — HYDROMORPHONE HCL 1 MG/ML IJ SOLN: 0.5000 mg | INTRAMUSCULAR | Status: DC | PRN

## 2023-02-20 MED ORDER — OXYCODONE HCL 5 MG/5ML PO SOLN: 5.0000 mg | Freq: Once | ORAL | Status: DC | PRN

## 2023-02-20 MED ORDER — OXYCODONE HCL 5 MG PO TABS: 5.0000 mg | ORAL_TABLET | Freq: Once | ORAL | Status: DC | PRN

## 2023-02-20 MED ORDER — HYDRALAZINE HCL 20 MG/ML IJ SOLN: 10.0000 mg | INTRAMUSCULAR | Status: DC | PRN

## 2023-02-20 MED ORDER — FLEET ENEMA 7-19 GM/118ML RE ENEM: 1.0000 | ENEMA | Freq: Once | RECTAL | Status: DC | PRN

## 2023-02-20 MED ORDER — MORPHINE SULFATE (PF) 2 MG/ML IV SOLN: 2.0000 mg | INTRAVENOUS | Status: DC | PRN

## 2023-02-20 MED ORDER — DEXAMETHASONE SODIUM PHOSPHATE 10 MG/ML IJ SOLN
INTRAMUSCULAR | Status: AC
  Filled 2023-02-20: qty 1

## 2023-02-20 MED ORDER — SODIUM CHLORIDE 0.9 % IV BOLUS
1000.0000 mL | Freq: Once | INTRAVENOUS | Status: AC
  Administered 2023-02-20: 1000 mL via INTRAVENOUS

## 2023-02-20 MED ORDER — ROCURONIUM 10MG/ML (10ML) SYRINGE FOR MEDFUSION PUMP - OPTIME
INTRAVENOUS | Status: DC | PRN
  Administered 2023-02-20: 50 mg via INTRAVENOUS
  Administered 2023-02-20: 20 mg via INTRAVENOUS

## 2023-02-20 MED ORDER — SODIUM CHLORIDE 0.9 % IV SOLN: 250.0000 mL | INTRAVENOUS | Status: DC | PRN

## 2023-02-20 MED ORDER — OXYCODONE HCL 5 MG PO TABS: 5.0000 mg | ORAL_TABLET | Freq: Four times a day (QID) | ORAL | Status: DC | PRN

## 2023-02-20 MED ORDER — SODIUM CHLORIDE 0.9 % IR SOLN
Status: DC | PRN
  Administered 2023-02-20: 1000 mL

## 2023-02-20 MED ORDER — AMLODIPINE BESYLATE 5 MG PO TABS
10.0000 mg | ORAL_TABLET | Freq: Every day | ORAL | Status: DC
  Administered 2023-02-20 – 2023-02-21 (×2): 10 mg via ORAL
  Filled 2023-02-20 (×2): qty 2

## 2023-02-20 MED ORDER — LEVALBUTEROL HCL 0.63 MG/3ML IN NEBU: 0.6300 mg | INHALATION_SOLUTION | Freq: Four times a day (QID) | RESPIRATORY_TRACT | Status: DC | PRN

## 2023-02-20 MED ORDER — IPRATROPIUM BROMIDE 0.02 % IN SOLN: 0.5000 mg | Freq: Four times a day (QID) | RESPIRATORY_TRACT | Status: DC | PRN

## 2023-02-20 MED ORDER — PHENYLEPHRINE HCL (PRESSORS) 10 MG/ML IV SOLN
INTRAVENOUS | Status: DC | PRN
  Administered 2023-02-20 (×2): 160 ug via INTRAVENOUS
  Administered 2023-02-20: 240 ug via INTRAVENOUS

## 2023-02-20 MED ORDER — ACETAMINOPHEN 650 MG RE SUPP: 650.0000 mg | Freq: Four times a day (QID) | RECTAL | Status: DC | PRN

## 2023-02-20 MED ORDER — ONDANSETRON HCL 4 MG/2ML IJ SOLN
4.0000 mg | Freq: Once | INTRAMUSCULAR | Status: AC
  Administered 2023-02-20: 4 mg via INTRAVENOUS
  Filled 2023-02-20: qty 2

## 2023-02-20 MED ORDER — LIDOCAINE HCL (CARDIAC) PF 50 MG/5ML IV SOSY
PREFILLED_SYRINGE | INTRAVENOUS | Status: DC | PRN
  Administered 2023-02-20: 70 mg via INTRAVENOUS

## 2023-02-20 MED ORDER — ONDANSETRON HCL 4 MG PO TABS: 4.0000 mg | ORAL_TABLET | Freq: Four times a day (QID) | ORAL | Status: DC | PRN

## 2023-02-20 MED ORDER — OXYCODONE HCL 5 MG PO TABS: 5.0000 mg | ORAL_TABLET | ORAL | Status: DC | PRN

## 2023-02-20 MED ORDER — HYDROMORPHONE HCL 1 MG/ML IJ SOLN
1.0000 mg | INTRAMUSCULAR | Status: DC | PRN
  Filled 2023-02-20: qty 1

## 2023-02-20 MED ORDER — CHLORHEXIDINE GLUCONATE 0.12 % MT SOLN
OROMUCOSAL | Status: AC
  Administered 2023-02-20: 15 mL via OROMUCOSAL
  Filled 2023-02-20: qty 15

## 2023-02-20 MED ORDER — HEPARIN SODIUM (PORCINE) 5000 UNIT/ML IJ SOLN
5000.0000 [IU] | Freq: Three times a day (TID) | INTRAMUSCULAR | Status: DC
  Administered 2023-02-20 – 2023-02-21 (×2): 5000 [IU] via SUBCUTANEOUS
  Filled 2023-02-20 (×2): qty 1

## 2023-02-20 MED ORDER — SODIUM CHLORIDE 0.9 % IV SOLN: INTRAVENOUS | Status: DC

## 2023-02-20 MED ORDER — ACETAMINOPHEN 500 MG PO TABS
1000.0000 mg | ORAL_TABLET | Freq: Four times a day (QID) | ORAL | Status: DC
  Administered 2023-02-20 – 2023-02-21 (×3): 1000 mg via ORAL
  Filled 2023-02-20 (×3): qty 2

## 2023-02-20 MED ORDER — ACETAMINOPHEN 325 MG PO TABS: 650.0000 mg | ORAL_TABLET | Freq: Four times a day (QID) | ORAL | Status: DC | PRN

## 2023-02-20 MED ORDER — SODIUM CHLORIDE 0.9% FLUSH
3.0000 mL | Freq: Two times a day (BID) | INTRAVENOUS | Status: DC
  Administered 2023-02-20: 3 mL via INTRAVENOUS

## 2023-02-20 MED ORDER — LACTATED RINGERS IV SOLN: INTRAVENOUS | Status: DC

## 2023-02-20 MED ORDER — PROPOFOL 10 MG/ML IV BOLUS
INTRAVENOUS | Status: DC | PRN
  Administered 2023-02-20: 200 mg via INTRAVENOUS

## 2023-02-20 MED ORDER — SODIUM CHLORIDE 0.9% FLUSH: 3.0000 mL | INTRAVENOUS | Status: DC | PRN

## 2023-02-20 MED ORDER — DEXAMETHASONE SODIUM PHOSPHATE 10 MG/ML IJ SOLN
INTRAMUSCULAR | Status: DC | PRN
  Administered 2023-02-20: 8 mg via INTRAVENOUS

## 2023-02-20 MED ORDER — BUPIVACAINE HCL (PF) 0.5 % IJ SOLN
INTRAMUSCULAR | Status: AC
  Filled 2023-02-20: qty 30

## 2023-02-20 MED ORDER — POTASSIUM CHLORIDE 2 MEQ/ML IV SOLN
INTRAVENOUS | Status: DC
  Filled 2023-02-20 (×7): qty 1000

## 2023-02-20 MED ORDER — IOHEXOL 300 MG/ML  SOLN
100.0000 mL | Freq: Once | INTRAMUSCULAR | Status: AC | PRN
  Administered 2023-02-20: 100 mL via INTRAVENOUS

## 2023-02-20 MED ORDER — ONDANSETRON HCL 4 MG/2ML IJ SOLN: 4.0000 mg | Freq: Four times a day (QID) | INTRAMUSCULAR | Status: DC | PRN

## 2023-02-20 SURGICAL SUPPLY — 35 items
ADH SKN CLS APL DERMABOND .7 (GAUZE/BANDAGES/DRESSINGS) ×1
BLADE SURG 15 STRL LF DISP TIS (BLADE) IMPLANT
BLADE SURG 15 STRL SS (BLADE) ×1
CLOTH BEACON ORANGE TIMEOUT ST (SAFETY) ×1 IMPLANT
COVER LIGHT HANDLE STERIS (MISCELLANEOUS) ×2 IMPLANT
DERMABOND ADVANCED .7 DNX12 (GAUZE/BANDAGES/DRESSINGS) ×1 IMPLANT
DRAIN PENROSE 0.5X18 (DRAIN) ×1 IMPLANT
ELECT REM PT RETURN 9FT ADLT (ELECTROSURGICAL) ×1
ELECTRODE REM PT RTRN 9FT ADLT (ELECTROSURGICAL) ×1 IMPLANT
GAUZE 4X4 16PLY ~~LOC~~+RFID DBL (SPONGE) IMPLANT
GAUZE SPONGE 4X4 12PLY STRL (GAUZE/BANDAGES/DRESSINGS) ×1 IMPLANT
GLOVE BIOGEL PI IND STRL 6.5 (GLOVE) ×1 IMPLANT
GLOVE BIOGEL PI IND STRL 7.0 (GLOVE) ×2 IMPLANT
GLOVE ECLIPSE 6.5 STRL STRAW (GLOVE) IMPLANT
GLOVE SURG SS PI 6.5 STRL IVOR (GLOVE) IMPLANT
GOWN STRL REUS W/TWL LRG LVL3 (GOWN DISPOSABLE) ×3 IMPLANT
INST SET MINOR GENERAL (KITS) ×1 IMPLANT
KIT TURNOVER KIT A (KITS) ×1 IMPLANT
MANIFOLD NEPTUNE II (INSTRUMENTS) ×1 IMPLANT
MESH PRE-SHAPE KEYHOLE 1.8X4 (Mesh General) IMPLANT
NDL HYPO 21X1.5 SAFETY (NEEDLE) IMPLANT
NEEDLE HYPO 21X1.5 SAFETY (NEEDLE) ×1 IMPLANT
NS IRRIG 1000ML POUR BTL (IV SOLUTION) ×1 IMPLANT
PACK MINOR (CUSTOM PROCEDURE TRAY) ×1 IMPLANT
PAD ARMBOARD 7.5X6 YLW CONV (MISCELLANEOUS) ×1 IMPLANT
SET BASIN LINEN APH (SET/KITS/TRAYS/PACK) ×1 IMPLANT
SOL PREP PROV IODINE SCRUB 4OZ (MISCELLANEOUS) ×1 IMPLANT
SPONGE T-LAP 18X18 ~~LOC~~+RFID (SPONGE) IMPLANT
SUT MNCRL AB 4-0 PS2 18 (SUTURE) ×1 IMPLANT
SUT NOVA NAB GS-22 2 2-0 T-19 (SUTURE) IMPLANT
SUT VIC AB 2-0 CT1 27 (SUTURE) ×1
SUT VIC AB 2-0 CT1 TAPERPNT 27 (SUTURE) ×1 IMPLANT
SUT VIC AB 3-0 SH 27 (SUTURE) ×2
SUT VIC AB 3-0 SH 27X BRD (SUTURE) ×1 IMPLANT
SYR 30ML LL (SYRINGE) ×1 IMPLANT

## 2023-02-20 NOTE — Assessment & Plan Note (Signed)
With history of prostatectomy -Currently stable

## 2023-02-20 NOTE — Assessment & Plan Note (Addendum)
-  Admitted to general medical floor -Close observation -NPO -IV fluids LR with 20 Kcl  CTabdomen pelvis revealed incarcerated right inguinal hernia with a proximal small bowel obstruction  The case has been discussed with surgery Dr.Pappayloui.  Patient has been kept n.p.o., as needed analgesics Status post evaluation, pending surgical intervention reduction and fixation of incarcerated hernia

## 2023-02-20 NOTE — Anesthesia Procedure Notes (Signed)
Procedure Name: Intubation Date/Time: 02/20/2023 1:37 PM  Performed by: Ronny Bacon, RNPre-anesthesia Checklist: Patient identified, Patient being monitored, Timeout performed, Emergency Drugs available and Suction available Patient Re-evaluated:Patient Re-evaluated prior to induction Oxygen Delivery Method: Circle system utilized Preoxygenation: Pre-oxygenation with 100% oxygen Induction Type: IV induction Ventilation: Mask ventilation without difficulty and Oral airway inserted - appropriate to patient size Laryngoscope Size: Mac and 3 Grade View: Grade I Tube type: Oral Tube size: 7.0 mm Number of attempts: 1 Airway Equipment and Method: stylet Placement Confirmation: ETT inserted through vocal cords under direct vision, positive ETCO2 and breath sounds checked- equal and bilateral Secured at: 22 cm Tube secured with: Tape Dental Injury: Teeth and Oropharynx as per pre-operative assessment

## 2023-02-20 NOTE — ED Provider Notes (Addendum)
[employer] EMERGENCY DEPARTMENT AT [facility] Provider Note   CSN: [id number] Arrival date & time: 02/19/23  2215     History  Chief Complaint  Patient presents with   Abdominal Pain    Amarii Weidinger is a 77 y.o. male.  Patient is a 77 year old male with past medical history of hypertension.  Patient presenting today with complaints of abdominal pain.  He describes generalized abdominal pain that started yesterday and is worsening.  He was seen by his primary doctor who referred him here for a CT scan.  Patient denies any fevers or chills.  He does describe loose stools, but denies any vomiting.  He denies ill contacts or having consumed suspicious foods.  No prior abdominal surgery.  The history is provided by the patient.       Home Medications Prior to Admission medications   Medication Sig Start Date End Date Taking? Authorizing Provider  amLODipine (NORVASC) 5 MG tablet Take 2 tablets (10 mg total) by mouth daily. 03/02/21   Catarina Hartshorn, MD  pantoprazole (PROTONIX) 40 MG tablet Take 1 tablet (40 mg total) by mouth daily. 03/03/21   Vassie Loll, MD  timolol (BETIMOL) 0.5 % ophthalmic solution Place 1 drop into the left eye 2 (two) times daily.    [provider]      Allergies    Patient has no known allergies.    Review of Systems   Review of Systems  All other systems reviewed and are negative.   Physical Exam Updated Vital Signs BP (!) 158/95 (BP Location: Right Arm)   Pulse (!) 102   Temp (!) 97.4 F (36.3 C) (Oral)   Resp 18   SpO2 100%  Physical Exam Vitals and nursing note reviewed.  Constitutional:      General: He is not in acute distress.    Appearance: He is well-developed. He is not diaphoretic.  HENT:     Head: Normocephalic and atraumatic.  Cardiovascular:     Rate and Rhythm: Normal rate and regular rhythm.     Heart sounds: No murmur heard.    No friction rub.  Pulmonary:     Effort: Pulmonary effort is normal. No  respiratory distress.     Breath sounds: Normal breath sounds. No wheezing or rales.  Abdominal:     General: Bowel sounds are normal. There is no distension.     Palpations: Abdomen is soft.     Tenderness: There is abdominal tenderness in the right lower quadrant and suprapubic area. There is no right CVA tenderness, left CVA tenderness, guarding or rebound.  Musculoskeletal:        General: Normal range of motion.     Cervical back: Normal range of motion and neck supple.  Skin:    General: Skin is warm and dry.  Neurological:     Mental Status: He is alert and oriented to person, place, and time.     Coordination: Coordination normal.     ED Results / Procedures / Treatments   Labs (all labs ordered are listed, but only abnormal results are displayed) Labs Reviewed  COMPREHENSIVE METABOLIC PANEL - Abnormal; Notable for the following components:      Result Value   Potassium 3.0 (*)    Glucose, Bld 169 (*)    Total Protein 8.2 (*)    Total Bilirubin 1.6 (*)    All other components within normal limits  CBC - Abnormal; Notable for the following components:   WBC  20.5 (*)    Platelets 439 (*)    All other components within normal limits  LIPASE, BLOOD  URINALYSIS, ROUTINE W REFLEX MICROSCOPIC    EKG EKG Interpretation  Date/Time:  Tuesday February 20 2023 04:38:26 EDT Ventricular Rate:  99 PR Interval:  238 QRS Duration: 76 QT Interval:  276 QTC Calculation: 355 R Axis:   85 Text Interpretation: Sinus rhythm Sinus pause Prolonged PR interval Borderline right axis deviation Nonspecific T abnormalities Confirmed by Geoffery Lyons ([id number]) on 02/20/2023 4:45:12 AM  Radiology No results found.  Procedures Procedures    Medications Ordered in ED Medications  sodium chloride 0.9 % bolus 1,000 mL (has no administration in time range)    ED Course/ Medical Decision Making/ A&P  Patient is a 77 year old male presenting with complaints of abdominal pain starting Sunday  evening, then worsening throughout the day on Monday.  He was sent by his primary doctor for a CT scan.  Patient arrives here with stable vital signs.  He does have tenderness across the lower abdomen, but no peritoneal signs.  He is afebrile.  Workup initiated including CBC, comprehensive metabolic panel, and lipase.  Patient has a white count of 20.5, but laboratory studies otherwise unremarkable.  CT scan of the abdomen and pelvis obtained showing what appears to be an incarcerated right inguinal hernia with proximal small bowel obstruction.  I attempted manual reduction, however was unsuccessful.  I made an additional attempt after giving Dilaudid for analgesia, however was also unsuccessful.  I then discussed care with Dr. Ammie Dalton from general surgery.  It was her recommendation that I make another attempt with the patient in Trendelenburg and after icing the hernia.  He was given an additional dose of Dilaudid and again was unsuccessful.  Patient to be admitted to the hospitalist service with urgent general surgery consultation.  Final Clinical Impression(s) / ED Diagnoses Final diagnoses:  None    Rx / DC Orders ED Discharge Orders     None         Geoffery Lyons, MD 02/20/23 6045    Geoffery Lyons, MD 02/20/23 [phone]

## 2023-02-20 NOTE — H&P (Signed)
History and Physical   Patient: Curtis Marquez                            PCP: Kirstie Peri, MD                    DOB: 09-06-1946            DOA: 02/19/2023 ZOX:[id number]             DOS: 02/20/2023, 11:35 AM  Kirstie Peri, MD  Patient coming from:   HOME  I have personally reviewed patient's medical records, in electronic medical records, including:  [place] link, and care everywhere.    Chief Complaint:   Chief Complaint  Patient presents with   Abdominal Pain    History of present illness:    Curtis Marquez  is a 77 year old male with extensive history of prostate cancer (prostatectomy), HTN, HLD, GERD, remote history of brain tumor... Presented with chief complaint of abdominal pain. Describes pain as general diffuse pain started yesterday and progressively getting worse.  Denies any nausea vomiting constipation or diarrhea, any recent illnesses.  Was seen by PCP, sent for abdominal CT.  ED course/evaluation: Blood pressure (!) 147/90, pulse 95, temperature 98.3 F (36.8 C), RR 18, weight 74.5 kg, SpO2 100 %.  CMP potassium 3.0, glucose 169, CBC WBC 20.5, platelets 439   CTabdomen pelvis revealed incarcerated right inguinal hernia with a proximal small bowel obstruction  The case has been discussed with surgery Dr.Pappayloui.  Patient has been kept n.p.o., as needed analgesics Pending evaluation    Patient Denies having: Fever, Chills, Cough, SOB, Chest Pain, N/V/D, headache, dizziness, lightheadedness,  Dysuria, Joint pain, rash, open wounds     Review of Systems: As per HPI, otherwise 10 point review of systems were negative.   ----------------------------------------------------------------------------------------------------------------------  No Known Allergies  Home MEDs:  Prior to Admission medications   Medication Sig Start Date End Date Taking? Authorizing Provider  amLODipine (NORVASC) 5 MG tablet Take 2 tablets (10 mg total) by mouth daily. 03/02/21    Catarina Hartshorn, MD  pantoprazole (PROTONIX) 40 MG tablet Take 1 tablet (40 mg total) by mouth daily. 03/03/21   Vassie Loll, MD  timolol (BETIMOL) 0.5 % ophthalmic solution Place 1 drop into the left eye 2 (two) times daily.    [provider]    PRN MEDs: acetaminophen **OR** acetaminophen, bisacodyl, hydrALAZINE, HYDROmorphone (DILAUDID) injection, ipratropium, levalbuterol, morphine injection, ondansetron **OR** ondansetron (ZOFRAN) IV, oxyCODONE, senna-docusate, sodium phosphate, traZODone  Past Medical History:  Diagnosis Date   Brain tumor (HCC)    GERD (gastroesophageal reflux disease)    Hypercholesteremia    Hypertension    Prostate cancer (HCC) 11/13/13    Past Surgical History:  Procedure Laterality Date   ARTERY BIOPSY Left 02/28/2021   Procedure: BIOPSY TEMPORAL ARTERY;  Surgeon: Lucretia Roers, MD;  Location: [facility];  Service: General;  Laterality: Left;   BRAIN TUMOR EXCISION  10/23/1992   ESOPHAGOGASTRODUODENOSCOPY N/A 03/18/2019   food impaction, benign-appearing esophageal stenosis, mild gastritis.   ESOPHAGOGASTRODUODENOSCOPY N/A 03/24/2019   one benign-appearing, intrinsic moderate stenosis was fond s/p dilation. Mild gastritis.   LYMPHADENECTOMY Bilateral 11/13/2013   Procedure: LYMPHADENECTOMY "PELVIC LYMPH NODE DISSECTION";  Surgeon: Crecencio Mc, MD;  Location: [facility];  Service: Urology;  Laterality: Bilateral;   ROBOT ASSISTED LAPAROSCOPIC RADICAL PROSTATECTOMY N/A 11/13/2013   Procedure: ROBOTIC ASSISTED LAPAROSCOPIC RADICAL PROSTATECTOMY LEVEL 2;  Surgeon: Crecencio Mc,  MD;  Location: [facility];  Service: Urology;  Laterality: N/A;   SAVORY DILATION N/A 03/24/2019   Procedure: SAVORY DILATION;  Surgeon: West Bali, MD;  Location: [facility] SUITE;  Service: Endoscopy;  Laterality: N/A;     reports that he has quit smoking. He has never used smokeless tobacco. He reports that he does not drink alcohol and does not use drugs.   Family History   Problem Relation Age of Onset   Diabetes Mother    Hypertension Mother    CVA Father    Diabetes Sister    Hypertension Sister    Cancer Brother    Colon cancer Neg Hx    Colon polyps Neg Hx     Physical Exam:   Vitals:   02/20/23 0400 02/20/23 0500 02/20/23 0558 02/20/23 1000  BP: 133/79 134/79 (!) 147/90 (!) 146/82  Pulse: 97 99 95 91  Resp: 18 11 18 16   Temp:   98.3 F (36.8 C) 98.5 F (36.9 C)  TempSrc:   Oral Oral  SpO2: 92% 94% 100% 97%  Weight:   74.5 kg    Constitutional: NAD, calm, comfortable Eyes: PERRL, lids and conjunctivae normal ENMT: Mucous membranes are moist. Posterior pharynx clear of any exudate or lesions.Normal dentition.  Neck: normal, supple, no masses, no thyromegaly Respiratory: clear to auscultation bilaterally, no wheezing, no crackles. Normal respiratory effort. No accessory muscle use.  Cardiovascular: Regular rate and rhythm, no murmurs / rubs / gallops. No extremity edema. 2+ pedal pulses. No carotid bruits.  Abdomen: Diffuse abdominal tenderness, no masses palpated. No hepatosplenomegaly. Bowel sounds positive.  Musculoskeletal: no clubbing / cyanosis. No joint deformity upper and lower extremities. Good ROM, no contractures. Normal muscle tone.  Neurologic: CN II-XII grossly intact. Sensation intact, DTR normal. Strength 5/5 in all 4.  Psychiatric: Normal judgment and insight. Alert and oriented x 3. Normal mood.  Skin: no rashes, lesions, ulcers. No induration Decubitus/ulcers:  Wounds: per nursing documentation         Labs on admission:    I have personally reviewed following labs and imaging studies  CBC: Recent Labs  Lab 02/19/23 2306  WBC 20.5*  HGB 15.4  HCT 46.2  MCV 87.5  PLT 439*   Basic Metabolic Panel: Recent Labs  Lab 02/19/23 2306 02/20/23 0820  NA 139  --   K 3.0*  --   CL 100  --   CO2 27  --   GLUCOSE 169*  --   BUN 23  --   CREATININE 1.14  --   CALCIUM 9.7  --   MG  --  2.3  PHOS  --  4.0    GFR: CrCl cannot be calculated (Unknown ideal weight.). Liver Function Tests: Recent Labs  Lab 02/19/23 2306  AST 21  ALT 28  ALKPHOS 116  BILITOT 1.6*  PROT 8.2*  ALBUMIN 4.0   Recent Labs  Lab 02/19/23 2306  LIPASE 27    Urine analysis:    Component Value Date/Time   COLORURINE YELLOW 02/25/2021 0118   APPEARANCEUR CLEAR 02/25/2021 0118   LABSPEC >1.046 (H) 02/25/2021 0118   PHURINE 5.0 02/25/2021 0118   GLUCOSEU NEGATIVE 02/25/2021 0118   HGBUR NEGATIVE 02/25/2021 0118   BILIRUBINUR NEGATIVE 02/25/2021 0118   KETONESUR 5 (A) 02/25/2021 0118   PROTEINUR NEGATIVE 02/25/2021 0118   NITRITE NEGATIVE 02/25/2021 0118   LEUKOCYTESUR NEGATIVE 02/25/2021 0118    Last A1C:  No results found for: "HGBA1C"   Radiologic Exams  on Admission:   CT ABDOMEN PELVIS W CONTRAST  Result Date: 02/20/2023 CLINICAL DATA:  Abdomen pain EXAM: CT ABDOMEN AND PELVIS WITH CONTRAST TECHNIQUE: Multidetector CT imaging of the abdomen and pelvis was performed using the standard protocol following bolus administration of intravenous contrast. RADIATION DOSE REDUCTION: This exam was performed according to the departmental dose-optimization program which includes automated exposure control, adjustment of the mA and/or kV according to patient size and/or use of iterative reconstruction technique. CONTRAST:  OMNIPAQUE IOHEXOL 300 MG/ML  SOLN COMPARISON:  CT 02/24/2021 FINDINGS: Lower chest: Lung bases are clear. Minimal scarring at the right base. Hepatobiliary: Subcentimeter hypodensities in the liver. Distended gallbladder. No calcified stone Pancreas: Unremarkable. No pancreatic ductal dilatation or surrounding inflammatory changes. Spleen: Normal in size without focal abnormality. Adrenals/Urinary Tract: Adrenal glands are normal. Kidneys show no hydronephrosis. Cysts and subcentimeter hypodensities too small to further characterize, no imaging follow-up is recommended. Bladder is normal  Stomach/Bowel: Moderate fluid distension of the stomach. Multiple dilated fluid-filled loops of proximal and mid small bowel consistent with bowel obstruction. Obstruction is secondary to an incarcerated right inguinal hernia which contains mesenteric fat and thickened appearing loop of small bowel. Moderate fluid within the right hernia sac. Mesenteric vascular congestion within the hernia sac as well. Small bowel distal to the hernia is completely decompressed. Negative appendix. Vascular/Lymphatic: Mild to moderate aortic atherosclerosis. No aneurysm. No suspicious lymph nodes Reproductive: Prostate is unremarkable. Other: No free air. Small volume free fluid in the pelvis. Moderate fat containing left inguinal hernia. Musculoskeletal: No acute or suspicious osseous abnormality. IMPRESSION: 1. Findings consistent with small-bowel obstruction secondary to an incarcerated right inguinal hernia containing mesenteric fat and thickened appearing loop of small bowel. There is moderate fluid within the right groin hernia sac as well as vascular congestion of the herniated and right lower quadrant small bowel mesentery. Small volume free fluid in the pelvis. No free air. Moderate fat containing left inguinal hernia. 2. Aortic atherosclerosis. Aortic Atherosclerosis (ICD10-I70.0). Electronically Signed   By: Jasmine Pang M.D.   On: 02/20/2023 01:25    EKG:   Independently reviewed.  Orders placed or performed during the hospital encounter of 02/19/23   ED EKG   ED EKG   EKG 12-Lead   EKG 12-Lead   EKG 12-Lead   EKG   ---------------------------------------------------------------------------------------------------------------------------------------    Assessment / Plan:   Principal Problem:   Incarcerated hernia Active Problems:   Hypokalemia   Essential hypertension   GERD (gastroesophageal reflux disease)   Prostate cancer (HCC)   Leukocytosis   Thrombocytosis   Assessment and Plan: *  Incarcerated hernia -Admitted to general medical floor -Close observation -NPO -IV fluids LR with 20 Kcl  CTabdomen pelvis revealed incarcerated right inguinal hernia with a proximal small bowel obstruction  The case has been discussed with surgery Dr.Pappayloui.  Patient has been kept n.p.o., as needed analgesics Status post evaluation, pending surgical intervention reduction and fixation of incarcerated hernia  Essential hypertension -Once tolerating p.o. resume home medication of Norvasc -For now monitoring closely, as needed hydralazine  Hypokalemia -Potassium 3.0, repleting -Checking magnesium level  GERD (gastroesophageal reflux disease) - Initiating Protonix IV  Thrombocytosis -Chronic platelets at 439 -Monitoring closely  Leukocytosis - Reactive, x-ray reviewed, pending UA -Initiating Empiric antibiotics   Prostate cancer (HCC) With history of prostatectomy -Currently stable       Consults called: General surgery -------------------------------------------------------------------------------------------------------------------------------------------- DVT prophylaxis:  heparin injection 5,000 Units Start: 02/20/23 1400 TED hose Start: 02/20/23 0752 SCDs Start:  02/20/23 0752   Code Status:   Code Status: Full Code   Admission status: Patient will be admitted as Inpatient, with a greater than 2 midnight length of stay. Level of care: Med-Surg   Family Communication:  none at bedside  (The above findings and plan of care has been discussed with patient in detail, the patient expressed understanding and agreement of above plan)  --------------------------------------------------------------------------------------------------------------------------------------------------  Disposition Plan:  Anticipated 1-2 days Status is: Inpatient Remains inpatient appropriate because: Required surgery evaluation, likely surgical  intervention     ----------------------------------------------------------------------------------------------------------------------------------------------------  Time spent:  95  Min.  Was spent seeing and evaluating the patient, reviewing all medical records, drawn plan of care.  SIGNED: Kendell Bane, MD, FHM. FAAFP. [facility], Pager  (Please use [email or web address] to page/ or secure chat through epic) If 7PM-7AM, please contact night-coverage [email or web address],  02/20/2023, 11:35 AM

## 2023-02-20 NOTE — Progress Notes (Signed)
TOC consulted for HH/DME needs. Pt has pending PT/OT orders. Once pt has been evaluated by PT/OT TOC will follow up for recommendations and home needs. TOC to follow.

## 2023-02-20 NOTE — Anesthesia Preprocedure Evaluation (Signed)
Anesthesia Evaluation  Patient identified by MRN, date of birth, ID band Patient awake    Reviewed: Allergy & Precautions, H&P , NPO status , Patient's Chart, lab work & pertinent test results, reviewed documented beta blocker date and time   Airway Mallampati: II  TM Distance: >3 FB Neck ROM: full    Dental no notable dental hx.    Pulmonary neg pulmonary ROS, former smoker   Pulmonary exam normal breath sounds clear to auscultation       Cardiovascular Exercise Tolerance: Good hypertension, negative cardio ROS  Rhythm:regular Rate:Normal     Neuro/Psych negative neurological ROS  negative psych ROS   GI/Hepatic negative GI ROS, Neg liver ROS,GERD  ,,  Endo/Other  negative endocrine ROS    Renal/GU Renal diseasenegative Renal ROS  negative genitourinary   Musculoskeletal   Abdominal   Peds  Hematology negative hematology ROS (+)   Anesthesia Other Findings   Reproductive/Obstetrics negative OB ROS                             Anesthesia Physical Anesthesia Plan  ASA: 3 and emergent  Anesthesia Plan: General and General ETT   Post-op Pain Management:    Induction:   PONV Risk Score and Plan:   Airway Management Planned:   Additional Equipment:   Intra-op Plan:   Post-operative Plan:   Informed Consent: I have reviewed the patients History and Physical, chart, labs and discussed the procedure including the risks, benefits and alternatives for the proposed anesthesia with the patient or authorized representative who has indicated his/her understanding and acceptance.     Dental Advisory Given  Plan Discussed with: CRNA  Anesthesia Plan Comments:        Anesthesia Quick Evaluation

## 2023-02-20 NOTE — Discharge Instructions (Signed)
Ambulatory Surgery Discharge Instructions  General Anesthesia or Sedation Do not drive or operate heavy machinery for 24 hours.  Do not consume alcohol, tranquilizers, sleeping medications, or any non-prescribed medications for 24 hours. Do not make important decisions or sign any important papers in the next 24 hours. You should have someone with you tonight at home.  Activity  You are advised to go directly home from the hospital.  Restrict your activities and rest for a day.  Resume light activity tomorrow. No heavy lifting over 10 lbs or strenuous exercise.  Fluids and Diet Begin with clear liquids, bouillon, dry toast, soda crackers.  If not nauseated, you may go to a regular diet when you desire.  Greasy and spicy foods are not advised.  Medications  If you have not had a bowel movement in 24 hours, take 2 tablespoons over the counter Milk of mag.             You May resume your blood thinners tomorrow (Aspirin, coumadin, or other).  You are being discharged with prescriptions for Opioid/Narcotic Medications: There are some specific considerations for these medications that you should know. Opioid Meds have risks & benefits. Addiction to these meds is always a concern with prolonged use Take medication only as directed Do not drive while taking narcotic pain medication Do not crush tablets or capsules Do not use a different container than medication was dispensed in Lock the container of medication in a cool, dry place out of reach of children and pets. Opioid medication can cause addiction Do not share with anyone else (this is a felony) Do not store medications for future use. Dispose of them properly.     Disposal:  Find a [place] household drug take back site near you.  If you can't get to a drug take back site, use the recipe below as a last resort to dispose of expired, unused or unwanted drugs. Disposal  (Do not dispose chemotherapy drugs this way, talk to your  prescribing doctor instead.) Step 1: Mix drugs (do not crush) with dirt, kitty litter, or used coffee grounds and add a small amount of water to dissolve any solid medications. Step 2: Seal drugs in plastic bag. Step 3: Place plastic bag in trash. Step 4: Take prescription container and scratch out personal information, then recycle or throw away.  Operative Site  You have a liquid bandage over your incisions, this will begin to flake off in about a week. Ok to shower tomorrow. Keep wound clean and dry. No baths or swimming. No lifting more than 10 pounds.  Contact Information: If you have questions or concerns, please call our office, [phone], Monday- Thursday 8AM-5PM and Friday 8AM-12Noon.  If it is after hours or on the weekend, please call [facility] Main Number, [phone], and ask to speak to the surgeon on call for Dr. Pappayliou at [facility].   SPECIFIC COMPLICATIONS TO WATCH FOR: Inability to urinate Fever over 101? F by mouth Nausea and vomiting lasting longer than 24 hours. Pain not relieved by medication ordered Swelling around the operative site Increased redness, warmth, hardness, around operative area Numbness, tingling, or cold fingers or toes Blood -soaked dressing, (small amounts of oozing may be normal) Increasing and progressive drainage from surgical area or exam site  

## 2023-02-20 NOTE — Consult Note (Signed)
[facility] Consult  Reason for Consult: Incarcerated right inguinal hernia Referring Physician: Dr. Judd Lien  Chief Complaint   Abdominal Pain     HPI: Curtis Marquez is a 77 y.o. male who presents with a 3-day history of worsening abdominal pain.  He started having diffuse abdominal pain on Saturday.  He presented to his primary care doctor yesterday, at which time he underwent a KUB.  The KUB demonstrated normal bowel gas pattern.  Patient continued to feel worse with nausea and an episode of emesis, which prompted him to present to the emergency department last night.  He denies any nausea or vomiting since coming into the hospital.  He last passed flatus and had a bowel movement prior to coming to the hospital, at the same time that he was nauseous and had an episode of emesis.  His past medical history significant for hypertension, hyperlipidemia, GERD, and prostate cancer.  His surgical history is significant for robotic prostatectomy and pelvic lymphadenectomy with urology.  He denies use of blood thinning medications.  In the emergency department, he underwent a CT abdomen and pelvis which demonstrated concern for an incarcerated right inguinal hernia containing a thickened loop of small bowel resulting in small bowel obstruction.  He has a leukocytosis of 20.5, and lactic acid was normal at 1.3.  He currently states that his pain feels a little bit better.  He denies any known history of inguinal hernias in the past.  He denies ever having symptoms like this in the past.  Past Medical History:  Diagnosis Date   Brain tumor (HCC)    GERD (gastroesophageal reflux disease)    Hypercholesteremia    Hypertension    Prostate cancer (HCC) 11/13/13    Past Surgical History:  Procedure Laterality Date   ARTERY BIOPSY Left 02/28/2021   Procedure: BIOPSY TEMPORAL ARTERY;  Surgeon: Lucretia Roers, MD;  Location: [facility];  Service: General;  Laterality: Left;   BRAIN TUMOR  EXCISION  10/23/1992   ESOPHAGOGASTRODUODENOSCOPY N/A 03/18/2019   food impaction, benign-appearing esophageal stenosis, mild gastritis.   ESOPHAGOGASTRODUODENOSCOPY N/A 03/24/2019   one benign-appearing, intrinsic moderate stenosis was fond s/p dilation. Mild gastritis.   LYMPHADENECTOMY Bilateral 11/13/2013   Procedure: LYMPHADENECTOMY "PELVIC LYMPH NODE DISSECTION";  Surgeon: Crecencio Mc, MD;  Location: [facility];  Service: Urology;  Laterality: Bilateral;   ROBOT ASSISTED LAPAROSCOPIC RADICAL PROSTATECTOMY N/A 11/13/2013   Procedure: ROBOTIC ASSISTED LAPAROSCOPIC RADICAL PROSTATECTOMY LEVEL 2;  Surgeon: Crecencio Mc, MD;  Location: [facility];  Service: Urology;  Laterality: N/A;   SAVORY DILATION N/A 03/24/2019   Procedure: SAVORY DILATION;  Surgeon: West Bali, MD;  Location[facility];  Service: Endoscopy;  Laterality: N/A;    Family History  Problem Relation Age of Onset   Diabetes Mother    Hypertension Mother    CVA Father    Diabetes Sister    Hypertension Sister    Cancer Brother    Colon cancer Neg Hx    Colon polyps Neg Hx     Social History   Tobacco Use   Smoking status: Former   Smokeless tobacco: Never  Building services engineer Use: Never used  Substance Use Topics   Alcohol use: No    Alcohol/week: 0.0 standard drinks of alcohol   Drug use: No    Medications: I have reviewed the patient's current medications.  No Known Allergies   ROS:  Pertinent items are noted in HPI.  Blood pressure (!) 147/90, pulse  95, temperature 98.3 F (36.8 C), temperature source Oral, resp. rate 18, weight 74.5 kg, SpO2 100 %. Physical Exam Vitals reviewed.  Constitutional:      Appearance: He is well-developed.  HENT:     Head: Normocephalic and atraumatic.  Eyes:     Extraocular Movements: Extraocular movements intact.     Pupils: Pupils are equal, round, and reactive to light.  Cardiovascular:     Rate and Rhythm: Normal rate.  Pulmonary:     Effort: Pulmonary  effort is normal.  Abdominal:     Comments: Abdomen soft, nondistended, no percussion tenderness, nontender to palpation; no rigidity, guarding, rebound tenderness; right inguinal hernia, incarcerated and tender with deep palpation.  Unable to fully reduced right inguinal hernia  Skin:    General: Skin is warm and dry.  Neurological:     General: No focal deficit present.     Mental Status: He is alert and oriented to person, place, and time.  Psychiatric:        Mood and Affect: Mood normal.        Behavior: Behavior normal.     Results: Results for orders placed or performed during the hospital encounter of 02/19/23 (from the past 48 hour(s))  Lipase, blood     Status: None   Collection Time: 02/19/23 11:06 PM  Result Value Ref Range   Lipase 27 11 - 51 U/L    Comment: Performed at [facility], [address] [place], [place] [address]  Comprehensive metabolic panel     Status: Abnormal   Collection Time: 02/19/23 11:06 PM  Result Value Ref Range   Sodium 139 135 - 145 mmol/L   Potassium 3.0 (L) 3.5 - 5.1 mmol/L   Chloride 100 98 - 111 mmol/L   CO2 27 22 - 32 mmol/L   Glucose, Bld 169 (H) 70 - 99 mg/dL    Comment: Glucose reference range applies only to samples taken after fasting for at least 8 hours.   BUN 23 8 - 23 mg/dL   Creatinine, Ser 6.04 0.61 - 1.24 mg/dL   Calcium 9.7 8.9 - 54.0 mg/dL   Total Protein 8.2 (H) 6.5 - 8.1 g/dL   Albumin 4.0 3.5 - 5.0 g/dL   AST 21 15 - 41 U/L   ALT 28 0 - 44 U/L   Alkaline Phosphatase 116 38 - 126 U/L   Total Bilirubin 1.6 (H) 0.3 - 1.2 mg/dL   GFR, Estimated >98 >11 mL/min    Comment: (NOTE) Calculated using the CKD-EPI Creatinine Equation (2021)    Anion gap 12 5 - 15    Comment: Performed at [facility], [address], [place], [place] [address]  CBC     Status: Abnormal   Collection Time: 02/19/23 11:06 PM  Result Value Ref Range   WBC 20.5 (H) 4.0 - 10.5 K/uL   RBC 5.28 4.22 - 5.81 MIL/uL   Hemoglobin 15.4 13.0 - 17.0  g/dL   HCT 29.5 62.1 - 30.8 %   MCV 87.5 80.0 - 100.0 fL   MCH 29.2 26.0 - 34.0 pg   MCHC 33.3 30.0 - 36.0 g/dL   RDW 65.7 84.6 - 96.2 %   Platelets 439 (H) 150 - 400 K/uL   nRBC 0.0 0.0 - 0.2 %    Comment: Performed at [facility], [address], [place], [place] [address]  Lactic acid, plasma     Status: None   Collection Time: 02/20/23  2:30 AM  Result Value  Ref Range   Lactic Acid, Venous 1.3 0.5 - 1.9 mmol/L    Comment: Performed at [facility], [address], [place], [place] [address]  Magnesium     Status: None   Collection Time: 02/20/23  8:20 AM  Result Value Ref Range   Magnesium 2.3 1.7 - 2.4 mg/dL    Comment: Performed at [facility], [address], [place], [place] [address]  Phosphorus     Status: None   Collection Time: 02/20/23  8:20 AM  Result Value Ref Range   Phosphorus 4.0 2.5 - 4.6 mg/dL    Comment: Performed at [facility], [address], [place], [place] [address]  Protime-INR     Status: None   Collection Time: 02/20/23  8:20 AM  Result Value Ref Range   Prothrombin Time 13.3 11.4 - 15.2 seconds   INR 1.0 0.8 - 1.2    Comment: (NOTE) INR goal varies based on device and disease states. Performed at [facility], [address] [place], [place] [address]     CT ABDOMEN PELVIS W CONTRAST  Result Date: 02/20/2023 CLINICAL DATA:  Abdomen pain EXAM: CT ABDOMEN AND PELVIS WITH CONTRAST TECHNIQUE: Multidetector CT imaging of the abdomen and pelvis was performed using the standard protocol following bolus administration of intravenous contrast. RADIATION DOSE REDUCTION: This exam was performed according to the departmental dose-optimization program which includes automated exposure control, adjustment of the mA and/or kV according to patient size and/or use of iterative reconstruction technique. CONTRAST:  OMNIPAQUE IOHEXOL 300 MG/ML  SOLN COMPARISON:  CT 02/24/2021 FINDINGS: Lower chest: Lung bases are clear. Minimal scarring at the right base.  Hepatobiliary: Subcentimeter hypodensities in the liver. Distended gallbladder. No calcified stone Pancreas: Unremarkable. No pancreatic ductal dilatation or surrounding inflammatory changes. Spleen: Normal in size without focal abnormality. Adrenals/Urinary Tract: Adrenal glands are normal. Kidneys show no hydronephrosis. Cysts and subcentimeter hypodensities too small to further characterize, no imaging follow-up is recommended. Bladder is normal Stomach/Bowel: Moderate fluid distension of the stomach. Multiple dilated fluid-filled loops of proximal and mid small bowel consistent with bowel obstruction. Obstruction is secondary to an incarcerated right inguinal hernia which contains mesenteric fat and thickened appearing loop of small bowel. Moderate fluid within the right hernia sac. Mesenteric vascular congestion within the hernia sac as well. Small bowel distal to the hernia is completely decompressed. Negative appendix. Vascular/Lymphatic: Mild to moderate aortic atherosclerosis. No aneurysm. No suspicious lymph nodes Reproductive: Prostate is unremarkable. Other: No free air. Small volume free fluid in the pelvis. Moderate fat containing left inguinal hernia. Musculoskeletal: No acute or suspicious osseous abnormality. IMPRESSION: 1. Findings consistent with small-bowel obstruction secondary to an incarcerated right inguinal hernia containing mesenteric fat and thickened appearing loop of small bowel. There is moderate fluid within the right groin hernia sac as well as vascular congestion of the herniated and right lower quadrant small bowel mesentery. Small volume free fluid in the pelvis. No free air. Moderate fat containing left inguinal hernia. 2. Aortic atherosclerosis. Aortic Atherosclerosis (ICD10-I70.0). Electronically Signed   By: Jasmine Pang M.D.   On: 02/20/2023 01:25     Assessment & Plan:  Malikye Reppond is a 77 y.o. male who was admitted with small bowel obstruction secondary to an  incarcerated right inguinal hernia.  Imaging and blood work evaluated by myself.  -Explained the pathophysiology of hernias, and how he currently has a small bowel obstruction secondary to a loop of intestine within his hernia.  Since I am unable to  reduce the hernia, he will need to undergo surgical intervention to repair the hernia.   -While his lactic acid was normal, there is a risk that he may require bowel resection pending how the bowel looks intraoperatively -The risk and benefits of open right inguinal hernia repair, possible bowel resection, possible mesh were discussed including but not limited to bleeding, infection, injury to surrounding structures, need for additional procedures, and hernia recurrence.  After careful consideration, Elliot Meldrum has decided to proceed with surgery.  -NPO -IV fluids -PRN pain control and antiemetics -Patient may require NG tube placement if he develops worsening nausea/vomiting -Prophylactic antibiotics for surgery -Further recommendations to follow surgery  All questions were answered to the satisfaction of the patient and family.   -- Theophilus Kinds, DO [facility] [address] [place], [place] [address] [phone] (office)

## 2023-02-20 NOTE — ED Notes (Signed)
Patient transported to CT 

## 2023-02-20 NOTE — ED Notes (Signed)
ED TO INPATIENT HANDOFF REPORT  ED Nurse Name and Phone #: Toni Amend [phone]  S Name/Age/Gender Curtis Marquez 77 y.o. male Room/Bed: APA14/APA14  Code Status   Code Status: Prior  Home/SNF/Other Home Patient oriented to: self, place, time, and situation Is this baseline? Yes   Triage Complete: Triage complete  Chief Complaint Incarcerated hernia [K46.0]  Triage Note Pt arrived from home via Pov c/o abd pain that started yesterday. Pt went to PCP today and was told to come to the er for a ct scan.Pt vomited x 1. Pain is 10/10 generalized     Allergies No Known Allergies  Level of Care/Admitting Diagnosis ED Disposition     ED Disposition  Admit   Condition  --   Comment  Hospital Area: [facility] [[id number]]  Level of Care: Med-Surg [16]  Covid Evaluation: Asymptomatic - no recent exposure (last 10 days) testing not required  Diagnosis: Incarcerated hernia [119147]  Admitting Physician: Lilyan Gilford [[id number]]  Attending Physician: Lilyan Gilford [[id number]]  Certification:: I certify this patient will need inpatient services for at least 2 midnights  Estimated Length of Stay: 2          B Medical/Surgery History Past Medical History:  Diagnosis Date   Brain tumor (HCC)    GERD (gastroesophageal reflux disease)    Hypercholesteremia    Hypertension    Prostate cancer (HCC) 11/13/13   Past Surgical History:  Procedure Laterality Date   ARTERY BIOPSY Left 02/28/2021   Procedure: BIOPSY TEMPORAL ARTERY;  Surgeon: Lucretia Roers, MD;  Location: [facility];  Service: General;  Laterality: Left;   BRAIN TUMOR EXCISION  10/23/1992   ESOPHAGOGASTRODUODENOSCOPY N/A 03/18/2019   food impaction, benign-appearing esophageal stenosis, mild gastritis.   ESOPHAGOGASTRODUODENOSCOPY N/A 03/24/2019   one benign-appearing, intrinsic moderate stenosis was fond s/p dilation. Mild gastritis.   LYMPHADENECTOMY Bilateral 11/13/2013   Procedure: LYMPHADENECTOMY  "PELVIC LYMPH NODE DISSECTION";  Surgeon: Crecencio Mc, MD;  Location: [facility];  Service: Urology;  Laterality: Bilateral;   ROBOT ASSISTED LAPAROSCOPIC RADICAL PROSTATECTOMY N/A 11/13/2013   Procedure: ROBOTIC ASSISTED LAPAROSCOPIC RADICAL PROSTATECTOMY LEVEL 2;  Surgeon: Crecencio Mc, MD;  Location: [facility];  Service: Urology;  Laterality: N/A;   SAVORY DILATION N/A 03/24/2019   Procedure: SAVORY DILATION;  Surgeon: West Bali, MD;  [facility];  Service: Endoscopy;  Laterality: N/A;     A IV Location/Drains/Wounds Patient Lines/Drains/Airways Status     Active Line/Drains/Airways     Name Placement date Placement time Site Days   Peripheral IV 02/20/23 20 G Left Antecubital 02/20/23  0054  Antecubital  less than 1   Incision (Closed) 02/28/21 Head Left 02/28/21  1401  -- 722            Intake/Output Last 24 hours  Intake/Output Summary (Last 24 hours) at 02/20/2023 0450 Last data filed at 02/20/2023 0220 Gross per 24 hour  Intake 1000 ml  Output --  Net 1000 ml    Labs/Imaging Results for orders placed or performed during the hospital encounter of 02/19/23 (from the past 48 hour(s))  Lipase, blood     Status: None   Collection Time: 02/19/23 11:06 PM  Result Value Ref Range   Lipase 27 11 - 51 U/L    Comment: Performed at [facility], [address] [place], [place] [address]  Comprehensive metabolic panel     Status: Abnormal   Collection Time: 02/19/23 11:06 PM  Result Value Ref Range  Sodium 139 135 - 145 mmol/L   Potassium 3.0 (L) 3.5 - 5.1 mmol/L   Chloride 100 98 - 111 mmol/L   CO2 27 22 - 32 mmol/L   Glucose, Bld 169 (H) 70 - 99 mg/dL    Comment: Glucose reference range applies only to samples taken after fasting for at least 8 hours.   BUN 23 8 - 23 mg/dL   Creatinine, Ser 1.61 0.61 - 1.24 mg/dL   Calcium 9.7 8.9 - 09.6 mg/dL   Total Protein 8.2 (H) 6.5 - 8.1 g/dL   Albumin 4.0 3.5 - 5.0 g/dL   AST 21 15 - 41 U/L   ALT 28 0 - 44 U/L    Alkaline Phosphatase 116 38 - 126 U/L   Total Bilirubin 1.6 (H) 0.3 - 1.2 mg/dL   GFR, Estimated >04 >54 mL/min    Comment: (NOTE) Calculated using the CKD-EPI Creatinine Equation (2021)    Anion gap 12 5 - 15    Comment: Performed at [facility], [address], [place], [place] [address]  CBC     Status: Abnormal   Collection Time: 02/19/23 11:06 PM  Result Value Ref Range   WBC 20.5 (H) 4.0 - 10.5 K/uL   RBC 5.28 4.22 - 5.81 MIL/uL   Hemoglobin 15.4 13.0 - 17.0 g/dL   HCT 91.4 78.2 - 95.6 %   MCV 87.5 80.0 - 100.0 fL   MCH 29.2 26.0 - 34.0 pg   MCHC 33.3 30.0 - 36.0 g/dL   RDW 21.3 08.6 - 57.8 %   Platelets 439 (H) 150 - 400 K/uL   nRBC 0.0 0.0 - 0.2 %    Comment: Performed at [facility], [address] [place], [place] [address]  Lactic acid, plasma     Status: None   Collection Time: 02/20/23  2:30 AM  Result Value Ref Range   Lactic Acid, Venous 1.3 0.5 - 1.9 mmol/L    Comment: Performed at [facility], [address], [place], [place] [address]   CT ABDOMEN PELVIS W CONTRAST  Result Date: 02/20/2023 CLINICAL DATA:  Abdomen pain EXAM: CT ABDOMEN AND PELVIS WITH CONTRAST TECHNIQUE: Multidetector CT imaging of the abdomen and pelvis was performed using the standard protocol following bolus administration of intravenous contrast. RADIATION DOSE REDUCTION: This exam was performed according to the departmental dose-optimization program which includes automated exposure control, adjustment of the mA and/or kV according to patient size and/or use of iterative reconstruction technique. CONTRAST:  OMNIPAQUE IOHEXOL 300 MG/ML  SOLN COMPARISON:  CT 02/24/2021 FINDINGS: Lower chest: Lung bases are clear. Minimal scarring at the right base. Hepatobiliary: Subcentimeter hypodensities in the liver. Distended gallbladder. No calcified stone Pancreas: Unremarkable. No pancreatic ductal dilatation or surrounding inflammatory changes. Spleen: Normal in size without focal abnormality.  Adrenals/Urinary Tract: Adrenal glands are normal. Kidneys show no hydronephrosis. Cysts and subcentimeter hypodensities too small to further characterize, no imaging follow-up is recommended. Bladder is normal Stomach/Bowel: Moderate fluid distension of the stomach. Multiple dilated fluid-filled loops of proximal and mid small bowel consistent with bowel obstruction. Obstruction is secondary to an incarcerated right inguinal hernia which contains mesenteric fat and thickened appearing loop of small bowel. Moderate fluid within the right hernia sac. Mesenteric vascular congestion within the hernia sac as well. Small bowel distal to the hernia is completely decompressed. Negative appendix. Vascular/Lymphatic: Mild to moderate aortic atherosclerosis. No aneurysm. No suspicious lymph nodes Reproductive: Prostate is unremarkable. Other: No free air. Small volume free fluid in  the pelvis. Moderate fat containing left inguinal hernia. Musculoskeletal: No acute or suspicious osseous abnormality. IMPRESSION: 1. Findings consistent with small-bowel obstruction secondary to an incarcerated right inguinal hernia containing mesenteric fat and thickened appearing loop of small bowel. There is moderate fluid within the right groin hernia sac as well as vascular congestion of the herniated and right lower quadrant small bowel mesentery. Small volume free fluid in the pelvis. No free air. Moderate fat containing left inguinal hernia. 2. Aortic atherosclerosis. Aortic Atherosclerosis (ICD10-I70.0). Electronically Signed   By: Jasmine Pang M.D.   On: 02/20/2023 01:25    Pending Labs Unresulted Labs (From admission, onward)     Start     Ordered   02/19/23 2258  Urinalysis, Routine w reflex microscopic -Urine, Clean Catch  Once,   URGENT       Question:  Specimen Source  Answer:  Urine, Clean Catch   02/19/23 2258            Vitals/Pain Today's Vitals   02/20/23 0306 02/20/23 0318 02/20/23 0324 02/20/23 0400  BP:     133/79  Pulse:    97  Resp:    18  Temp:   99.1 F (37.3 C)   TempSrc:   Oral   SpO2:    92%  PainSc: 0-No pain 3       Isolation Precautions No active isolations  Medications Medications  0.9 %  sodium chloride infusion ( Intravenous New Bag/Given 02/20/23 0346)  morphine (PF) 2 MG/ML injection 2 mg (has no administration in time range)  sodium chloride 0.9 % bolus 1,000 mL (0 mLs Intravenous Stopped 02/20/23 0220)  iohexol (OMNIPAQUE) 300 MG/ML solution 100 mL (100 mLs Intravenous Contrast Given 02/20/23 0059)  HYDROmorphone (DILAUDID) injection 1 mg (1 mg Intravenous Given 02/20/23 0143)  ondansetron (ZOFRAN) injection 4 mg (4 mg Intravenous Given 02/20/23 0142)  HYDROmorphone (DILAUDID) injection 1 mg (1 mg Intravenous Given 02/20/23 0317)    Mobility walks     Focused Assessments    R Recommendations: See Admitting Provider Note  Report given to:   Additional Notes: 20G LAC; NS at 148mL/hr

## 2023-02-20 NOTE — Transfer of Care (Signed)
Immediate Anesthesia Transfer of Care Note  Patient: Curtis Marquez  Procedure(s) Performed: HERNIA REPAIR INGUINAL INCARCERATED WITH MESH (Right: Inguinal)  Patient Location: PACU  Anesthesia Type:General  Level of Consciousness: awake, alert , oriented, and patient cooperative  Airway & Oxygen Therapy: Patient Spontanous Breathing  Post-op Assessment: Report given to RN, Post -op Vital signs reviewed and stable, and Patient moving all extremities  Post vital signs: Reviewed and stable  Last Vitals:  Vitals Value Taken Time  BP 157/92 02/20/23 1537  Temp 36.7 C 02/20/23 1537  Pulse 108 02/20/23 1538  Resp 15 02/20/23 1539  SpO2 97 % 02/20/23 1538  Vitals shown include unvalidated device data.  Last Pain:  Vitals:   02/20/23 1537  TempSrc:   PainSc: Asleep      Patients Stated Pain Goal: 6 (02/20/23 1243)  Complications: No notable events documented.

## 2023-02-20 NOTE — Assessment & Plan Note (Signed)
-  Chronic platelets at 439 -Monitoring closely

## 2023-02-20 NOTE — Hospital Course (Signed)
Curtis Marquez  is a 77 year old male with extensive history of prostate cancer (prostatectomy), HTN, HLD, GERD, remote history of brain tumor... Presented with chief complaint of abdominal pain. Describes pain as general diffuse pain started yesterday and progressively getting worse.  Denies any nausea vomiting constipation or diarrhea, any recent illnesses.  Was seen by PCP, sent for abdominal CT.  ED course/evaluation: Blood pressure (!) 147/90, pulse 95, temperature 98.3 F (36.8 C), RR 18, weight 74.5 kg, SpO2 100 %.  CMP potassium 3.0, glucose 169, CBC WBC 20.5, platelets 439   CTabdomen pelvis revealed incarcerated right inguinal hernia with a proximal small bowel obstruction  The case has been discussed with surgery Dr.Pappayloui.  Patient has been kept n.p.o., as needed analgesics Pending evaluation

## 2023-02-20 NOTE — Assessment & Plan Note (Signed)
-  Once tolerating p.o. resume home medication of Norvasc -For now monitoring closely, as needed hydralazine

## 2023-02-20 NOTE — Op Note (Signed)
[facility] Operative Note  02/20/23  Preoperative Diagnosis: Incarcerated right inguinal hernia    Postoperative Diagnosis: Same   Procedure(s) Performed: Open right inguinal hernia repair with mesh   Surgeon: Theophilus Kinds, DO    Assistants: Lelon Frohlich, RNFA   Anesthesia: General endotracheal   Anesthesiologist: Windell Norfolk, MD    Specimens: None   Estimated Blood Loss: Minimal   Blood Replacement: None    Complications: None   Wound Class: Clean   Operative Indications: Patient is a 77 year old male who presented with a 3-day history of worsening abdominal pain that progressed to nausea and vomiting yesterday.  In the ED, he underwent a CT abdomen and pelvis which demonstrated an incarcerated right inguinal hernia containing a loop of small bowel with some associated inflammatory changes and causing small bowel obstruction.  Hernia was unable to be reduced at bedside, so decision was made to take the patient to the operating room for reduction and repair of his hernia.  Patient is agreeable to surgery.  All risks and benefits of performing this procedure were discussed with the patient including pain, infection, bleeding, damage to the surrounding structures, and need for more procedures or surgery. The patient voiced understanding of the procedure, all questions were sought and answered, and consent was obtained.  Findings: Large indirect right inguinal hernia containing loop of small bowel, bowel appeared viable after reduction   Procedure: The patient was taken to the operating room and placed supine. General endotracheal anesthesia was induced. Intravenous antibiotics were administered per protocol.  A time out was preformed verifying the correct patient, procedure, site, positioning and implants.  The right groin and scrotum were prepared and draped in the usual sterile fashion.   An incision was made in a natural skin crease between the pubic  tubercle and the anterior superior iliac spine.  The incision was deepened with electrocautery through Scarpa's and Camper's fascia until the aponeurosis of the external oblique was encountered.  This was cleaned and the external ring was exposed.  An incision was made in the midportion of the external oblique aponeurosis in the direction of its fibers. The ilioinguinal nerve was identified and was protected throughout the dissection.  Flaps of the external oblique were developed cephalad and inferiorly.    The cord was identified and it was gently dissented free at the pubic tubercle and encircled with a Penrose drain.  Attention was then directed at the anteromedial aspect of the cord, where an indirect hernia sac was identified.  The sac was carefully dissected free from the cord down to the level of the internal ring.  The vas and testicular vessels were identified and protected from harm.  Once the sac was dissected free from the cords, the Penrose was placed around the cord which was retracted inferiorly out of the field of view.  The sac was entered and noted to contain viable small bowel, which was reduced.  The hernia sac was closed with purse string stitch and then subsequently suture-ligated. The hernia was reduced into the internal ring without difficulty.  Attention was then turned to the floor of the canal, which was grossly weakened without any defined defect or sac.  The Bard Keyhole Mesh was sutured to the inguinal ligament inferiorly starting at the pubic tubercle using 2-0 Novafil interrupted sutures.  The mesh was sutured superiorly to the conjoint tendon using 2-0 Novafil interrupted sutures.  Care was taken to ensure the mesh was placed in a relaxed fashion to  avoid excessive tension and no neurovascular structures were caught in the repair.  Laterally the tails of the mesh were crossed and the internal ring was recreated, allowing for passage of cords without tension.   Hemostasis was  adequate.  The Penrose was removed.  The incision and subcutaneous tissues or localized with Marcaine.  The external oblique aponeurosis was closed with a 2-0 Vicryl suture in a running fashion, taking care to not catch the ilioinguinal nerve in the suture line.  Scarpa's fashion was closed with 3-0 Vicryl suture in a running fashion.  The dermis was closed with interrupted 3-0 Vicryl sutures. The skin was closed with a subcuticular 4-0 Monocryl suture.  Dermabond was applied.   The testis was gently pulled down into its anatomic position in the scrotum.  The patient tolerated the procedure well and was taken to the PACU in stable condition. All counts were correct at the end of the case.       Theophilus Kinds, DO  [facility] [address] [place], [place] [address] [phone] (office)

## 2023-02-20 NOTE — Assessment & Plan Note (Signed)
-   Reactive, x-ray reviewed, pending UA -Initiating Empiric antibiotics

## 2023-02-20 NOTE — Assessment & Plan Note (Signed)
-   Initiating Protonix IV

## 2023-02-20 NOTE — Progress Notes (Signed)
[facility]  Spoke with the patient's wife and aunt in the consultation room.  I explained that he tolerated the procedure without difficulty.  He has dissolvable stitches under the skin with overlying skin glue.  This will flake off in 10 to 14 days.  He will return to his bed on the floor, and I will order clear liquid diet for him.  If he tolerates this diet, I will give him more solid food tomorrow, with the hopes of discharge home at that time.  The patient will follow-up with me in 2 weeks.  All questions were answered to their expressed satisfaction.  Plan: -Transfer back to floor -Clear liquid diet -PRN pain control and antiemetics -No need for further antibiotics from surgical standpoint -AM labs -Bowel regimen with Dulcolax suppositories and Senokot-S -Possible discharge home tomorrow pending how patient progresses  Theophilus Kinds, DO [facility] [address] [place], [place] [address] [phone] (office)

## 2023-02-20 NOTE — Assessment & Plan Note (Signed)
-  Potassium 3.0, repleting -Checking magnesium level

## 2023-02-21 DIAGNOSIS — K460 Unspecified abdominal hernia with obstruction, without gangrene: Secondary | ICD-10-CM | POA: Diagnosis not present

## 2023-02-21 LAB — BASIC METABOLIC PANEL
Anion gap: 8 (ref 5–15)
BUN: 24 mg/dL — ABNORMAL HIGH (ref 8–23)
CO2: 26 mmol/L (ref 22–32)
Calcium: 7.7 mg/dL — ABNORMAL LOW (ref 8.9–10.3)
Chloride: 106 mmol/L (ref 98–111)
Creatinine, Ser: 1.08 mg/dL (ref 0.61–1.24)
GFR, Estimated: >60 mL/min (ref >60)
Glucose, Bld: 110 mg/dL — ABNORMAL HIGH (ref 70–99)
Potassium: 3.2 mmol/L — ABNORMAL LOW (ref 3.5–5.1)
Sodium: 140 mmol/L (ref 135–145)

## 2023-02-21 LAB — CBC
HCT: 36.6 % — ABNORMAL LOW (ref 39.0–52.0)
Hemoglobin: 11.8 g/dL — ABNORMAL LOW (ref 13.0–17.0)
MCH: 28.8 pg (ref 26.0–34.0)
MCHC: 32.2 g/dL (ref 30.0–36.0)
MCV: 89.3 fL (ref 80.0–100.0)
Platelets: 336 10*3/uL (ref 150–400)
RBC: 4.1 MIL/uL — ABNORMAL LOW (ref 4.22–5.81)
RDW: 14.3 % (ref 11.5–15.5)
WBC: 13.3 10*3/uL — ABNORMAL HIGH (ref 4.0–10.5)
nRBC: 0 % (ref 0.0–0.2)

## 2023-02-21 LAB — APTT: aPTT: 26 seconds (ref 24–36)

## 2023-02-21 LAB — GLUCOSE, CAPILLARY: Glucose-Capillary: 100 mg/dL — ABNORMAL HIGH (ref 70–99)

## 2023-02-21 LAB — LACTIC ACID, PLASMA: Lactic Acid, Venous: 0.7 mmol/L (ref 0.5–1.9)

## 2023-02-21 MED ORDER — LOSARTAN POTASSIUM 100 MG PO TABS: 100.0000 mg | ORAL_TABLET | Freq: Every day | ORAL | 3 refills | Status: DC

## 2023-02-21 MED ORDER — DOCUSATE SODIUM 100 MG PO CAPS: 100.0000 mg | ORAL_CAPSULE | Freq: Two times a day (BID) | ORAL | 2 refills | Status: DC

## 2023-02-21 MED ORDER — OXYCODONE HCL 5 MG PO TABS: 5.0000 mg | ORAL_TABLET | Freq: Four times a day (QID) | ORAL | 0 refills | Status: DC | PRN

## 2023-02-21 MED ORDER — POTASSIUM CHLORIDE CRYS ER 20 MEQ PO TBCR: 40.0000 meq | EXTENDED_RELEASE_TABLET | Freq: Once | ORAL | Status: DC

## 2023-02-21 MED ORDER — AMLODIPINE BESYLATE 10 MG PO TABS: 10.0000 mg | ORAL_TABLET | Freq: Every day | ORAL | 3 refills | Status: DC

## 2023-02-21 MED ORDER — PANTOPRAZOLE SODIUM 40 MG PO TBEC: 40.0000 mg | DELAYED_RELEASE_TABLET | Freq: Every day | ORAL | 5 refills | Status: DC

## 2023-02-21 MED ORDER — SENNOSIDES-DOCUSATE SODIUM 8.6-50 MG PO TABS: 2.0000 | ORAL_TABLET | Freq: Every day | ORAL | 1 refills | Status: AC

## 2023-02-21 MED ORDER — ACETAMINOPHEN 500 MG PO TABS: 1000.0000 mg | ORAL_TABLET | Freq: Four times a day (QID) | ORAL | 0 refills | Status: AC

## 2023-02-21 NOTE — TOC Progression Note (Signed)
  Transition of Care [facility]) Screening Note   Patient Details  Name: Curtis Marquez Date of Birth: 1946/09/05   Transition of Care [facility]) CM/SW Contact:    Elliot Gault, LCSW Phone Number: 02/21/2023, 12:59 PM    TOC consult for HH/DME needs reviewed. PT has evaluated and no DME or HH needs are identified. MD states pt does not have any DC needs. Will clear this consult.  Transition of Care Department [facility]) has reviewed patient and no TOC needs have been identified at this time. We will continue to monitor patient advancement through interdisciplinary progression rounds. If new patient transition needs arise, please place a TOC consult.

## 2023-02-21 NOTE — Discharge Summary (Signed)
Curtis Marquez, is a 77 y.o. male  DOB 14-Mar-1946  MRN [id number].  Admission date:  02/19/2023  Admitting Physician  Kendell Bane, MD  Discharge Date:  02/21/2023   Primary MD  Kirstie Peri, MD  Recommendations for primary care physician for things to follow:   1)Follow up with Dr. Webb Laws surgeon as advised in about 2 weeks 2)Stop HCTZ/hydrochlorothiazide  Admission Diagnosis  Small bowel obstruction (HCC) [K56.609] Incarcerated inguinal hernia [K40.30] Incarcerated hernia [K46.0]   Discharge Diagnosis  Small bowel obstruction (HCC) [K56.609] Incarcerated inguinal hernia [K40.30] Incarcerated hernia [K46.0]    Principal Problem:   Incarcerated hernia Active Problems:   Hypokalemia   Essential hypertension   GERD (gastroesophageal reflux disease)   Prostate cancer (HCC)   Leukocytosis   Thrombocytosis      Past Medical History:  Diagnosis Date   Brain tumor (HCC)    GERD (gastroesophageal reflux disease)    Hypercholesteremia    Hypertension    Prostate cancer (HCC) 11/13/13    Past Surgical History:  Procedure Laterality Date   ARTERY BIOPSY Left 02/28/2021   Procedure: BIOPSY TEMPORAL ARTERY;  Surgeon: Lucretia Roers, MD;  Location: [facility];  Service: General;  Laterality: Left;   BRAIN TUMOR EXCISION  10/23/1992   ESOPHAGOGASTRODUODENOSCOPY N/A 03/18/2019   food impaction, benign-appearing esophageal stenosis, mild gastritis.   ESOPHAGOGASTRODUODENOSCOPY N/A 03/24/2019   one benign-appearing, intrinsic moderate stenosis was fond s/p dilation. Mild gastritis.   LYMPHADENECTOMY Bilateral 11/13/2013   Procedure: LYMPHADENECTOMY "PELVIC LYMPH NODE DISSECTION";  Surgeon: Crecencio Mc, MD;  Location: [facility];  Service: Urology;  Laterality: Bilateral;   ROBOT ASSISTED LAPAROSCOPIC RADICAL PROSTATECTOMY N/A 11/13/2013   Procedure: ROBOTIC ASSISTED LAPAROSCOPIC RADICAL  PROSTATECTOMY LEVEL 2;  Surgeon: Crecencio Mc, MD;  Location: [facility];  Service: Urology;  Laterality: N/A;   SAVORY DILATION N/A 03/24/2019   Procedure: SAVORY DILATION;  Surgeon: West Bali, MD;  Location: [facility] SUITE;  Service: Endoscopy;  Laterality: N/A;     HPI  from the history and physical done on the day of admission:   Curtis Marquez  is a 77 year old male with extensive history of prostate cancer (prostatectomy), HTN, HLD, GERD, remote history of brain tumor... Presented with chief complaint of abdominal pain. Describes pain as general diffuse pain started yesterday and progressively getting worse.  Denies any nausea vomiting constipation or diarrhea, any recent illnesses.  Was seen by PCP, sent for abdominal CT.   ED course/evaluation: Blood pressure (!) 147/90, pulse 95, temperature 98.3 F (36.8 C), RR 18, weight 74.5 kg, SpO2 100 %.   CMP potassium 3.0, glucose 169, CBC WBC 20.5, platelets 439     CTabdomen pelvis revealed incarcerated right inguinal hernia with a proximal small bowel obstruction   The case has been discussed with surgery Dr.Pappayloui.  Patient has been kept n.p.o., as needed analgesics Pending evaluation      Patient Denies having: Fever, Chills, Cough, SOB, Chest Pain, N/V/D, headache, dizziness, lightheadedness,  Dysuria, Joint pain, rash, open wounds  Review of Systems: As per HPI, otherwise 10 point review of systems were negative.      Hospital Course:     Assessment and Plan: 1) Incarcerated Inguinal hernia -Admitted to general medical floor -CT abdomen and Pelvis revealed incarcerated right inguinal hernia with a proximal small bowel obstruction Consult from surgeon  Dr.Pappaylou appreciated -. S/P open right inguinal hernia repair with mesh on 02/20/23 -Tolerating oral intake well -Ambulated around the unit with physical therapist -as per general surgeon okay to discharge home with outpatient follow-up in a couple of  weeks  2)HTN--- -stable , continue amlodipine, stop HCTZ due to dehydration and hypokalemia concerns  Hypokalemia -Potassium 3.0-this is due to HCTZ use -Potassium has been replaced -HCTZ has been discontinued   GERD (gastroesophageal reflux disease) -Stable, continue PPI  Thrombocytosis -Chronic platelets at 439--- -ambulation encouraged to reduce DVT risk  Leukocytosis--reactive in the setting of incarcerated hernia -CT abdominal pelvis without additional findings except for above  Prostate cancer (HCC) With history of prostatectomy -Currently stable  Chronic anemia--stable -Hgb is back to baseline after IV fluids/hemodilution -On admission Hgb was higher most likely due to hemoconcentration in setting of strangulated hernia emesis and poor intake -  Discharge Condition: Stable,  Follow UP   Follow-up Information     Pappayliou, Catherine A, DO. Call.   Specialty: General Surgery Why: Call to schedule a follow up appointment in 2 weeks Contact information: [address] [place] [facility] [address] [phone]                 Consults obtained -General surgery  Diet and Activity recommendation:  As advised  Discharge Instructions    Discharge Instructions     Call MD for:  difficulty breathing, headache or visual disturbances   Complete by: As directed    Call MD for:  persistant dizziness or light-headedness   Complete by: As directed    Call MD for:  persistant nausea and vomiting   Complete by: As directed    Call MD for:  persistant nausea and vomiting   Complete by: As directed    Call MD for:  redness, tenderness, or signs of infection (pain, swelling, redness, odor or green/yellow discharge around incision site)   Complete by: As directed    Call MD for:  redness, tenderness, or signs of infection (pain, swelling, redness, odor or green/yellow discharge around incision site)   Complete by: As directed    Call MD for:  severe  uncontrolled pain   Complete by: As directed    Call MD for:  severe uncontrolled pain   Complete by: As directed    Call MD for:  temperature >100.4   Complete by: As directed    Call MD for:  temperature >100.4   Complete by: As directed    Diet - low sodium heart healthy   Complete by: As directed    Diet - low sodium heart healthy   Complete by: As directed    Discharge instructions   Complete by: As directed    1)Follow up with Dr. Webb Laws surgeon as advised in about 2 weeks 2)Stop HCTZ/hydrochlorothiazide   Increase activity slowly   Complete by: As directed    Increase activity slowly   Complete by: As directed         Discharge Medications     Allergies as of 02/21/2023   No Known Allergies      Medication List     STOP taking these medications  famotidine 40 MG tablet Commonly known as: PEPCID   hydrochlorothiazide 25 MG tablet Commonly known as: HYDRODIURIL       TAKE these medications    acetaminophen 500 MG tablet Commonly known as: TYLENOL Take 2 tablets (1,000 mg total) by mouth every 6 (six) hours.   amLODipine 10 MG tablet Commonly known as: NORVASC Take 1 tablet (10 mg total) by mouth daily.   losartan 100 MG tablet Commonly known as: COZAAR Take 1 tablet (100 mg total) by mouth daily.   oxyCODONE 5 MG immediate release tablet Commonly known as: Oxy IR/ROXICODONE Take 1 tablet (5 mg total) by mouth every 6 (six) hours as needed for moderate pain.   pantoprazole 40 MG tablet Commonly known as: PROTONIX Take 1 tablet (40 mg total) by mouth daily.   senna-docusate 8.6-50 MG tablet Commonly known as: Senokot-S Take 2 tablets by mouth at bedtime.       Major procedures and Radiology Reports - PLEASE review detailed and final reports for all details, in brief -   CT ABDOMEN PELVIS W CONTRAST  Result Date: 02/20/2023 CLINICAL DATA:  Abdomen pain EXAM: CT ABDOMEN AND PELVIS WITH CONTRAST TECHNIQUE: Multidetector CT  imaging of the abdomen and pelvis was performed using the standard protocol following bolus administration of intravenous contrast. RADIATION DOSE REDUCTION: This exam was performed according to the departmental dose-optimization program which includes automated exposure control, adjustment of the mA and/or kV according to patient size and/or use of iterative reconstruction technique. CONTRAST:  OMNIPAQUE IOHEXOL 300 MG/ML  SOLN COMPARISON:  CT 02/24/2021 FINDINGS: Lower chest: Lung bases are clear. Minimal scarring at the right base. Hepatobiliary: Subcentimeter hypodensities in the liver. Distended gallbladder. No calcified stone Pancreas: Unremarkable. No pancreatic ductal dilatation or surrounding inflammatory changes. Spleen: Normal in size without focal abnormality. Adrenals/Urinary Tract: Adrenal glands are normal. Kidneys show no hydronephrosis. Cysts and subcentimeter hypodensities too small to further characterize, no imaging follow-up is recommended. Bladder is normal Stomach/Bowel: Moderate fluid distension of the stomach. Multiple dilated fluid-filled loops of proximal and mid small bowel consistent with bowel obstruction. Obstruction is secondary to an incarcerated right inguinal hernia which contains mesenteric fat and thickened appearing loop of small bowel. Moderate fluid within the right hernia sac. Mesenteric vascular congestion within the hernia sac as well. Small bowel distal to the hernia is completely decompressed. Negative appendix. Vascular/Lymphatic: Mild to moderate aortic atherosclerosis. No aneurysm. No suspicious lymph nodes Reproductive: Prostate is unremarkable. Other: No free air. Small volume free fluid in the pelvis. Moderate fat containing left inguinal hernia. Musculoskeletal: No acute or suspicious osseous abnormality. IMPRESSION: 1. Findings consistent with small-bowel obstruction secondary to an incarcerated right inguinal hernia containing mesenteric fat and thickened  appearing loop of small bowel. There is moderate fluid within the right groin hernia sac as well as vascular congestion of the herniated and right lower quadrant small bowel mesentery. Small volume free fluid in the pelvis. No free air. Moderate fat containing left inguinal hernia. 2. Aortic atherosclerosis. Aortic Atherosclerosis (ICD10-I70.0). Electronically Signed   By: Jasmine Pang M.D.   On: 02/20/2023 01:25    Micro Results   Today   Subjective    Terez Hurd today has no additional complaints - Eating and drinking well -Ambulated in the hallways with physical therapist   Patient has been seen and examined prior to discharge   Objective   Blood pressure (!) 147/79, pulse 90, temperature 99.2 F (37.3 C), temperature source Oral, resp. rate 18, weight 74.5  kg, SpO2 100 %.   Intake/Output Summary (Last 24 hours) at 02/21/2023 1300 Last data filed at 02/21/2023 0447 Gross per 24 hour  Intake 1889.43 ml  Output 20 ml  Net 1869.43 ml    Exam Gen:- Awake Alert, no acute distress  HEENT:- [place].AT, No sclera icterus Neck-Supple Neck,No JVD,.  Lungs-  CTAB , good air movement bilaterally CV- S1, S2 normal, regular Abd-  +ve B.Sounds, Abd Soft, No tenderness, right inguinal area operative area clean dry intact Extremity/Skin:- No  edema,   good pulses Psych-affect is appropriate, oriented x3 Neuro-no new focal deficits, no tremors    Data Review   CBC w Diff:  Lab Results  Component Value Date   WBC 13.3 (H) 02/21/2023   HGB 11.8 (L) 02/21/2023   HCT 36.6 (L) 02/21/2023   PLT 336 02/21/2023   LYMPHOPCT 13 03/01/2021   MONOPCT 8 03/01/2021   EOSPCT 0 03/01/2021   BASOPCT 0 03/01/2021    CMP:  Lab Results  Component Value Date   NA 140 02/21/2023   K 3.2 (L) 02/21/2023   CL 106 02/21/2023   CO2 26 02/21/2023   BUN 24 (H) 02/21/2023   CREATININE 1.08 02/21/2023   PROT 8.2 (H) 02/19/2023   ALBUMIN 4.0 02/19/2023   BILITOT 1.6 (H) 02/19/2023   ALKPHOS 116  02/19/2023   AST 21 02/19/2023   ALT 28 02/19/2023  .  Total Discharge time is about 33 minutes  Shon Hale M.D on 02/21/2023 at 1:00 PM  Go to [email or web address] -  for contact info  [facility] - Office  [phone]

## 2023-02-21 NOTE — Evaluation (Signed)
Physical Therapy Evaluation Patient Details Name: Curtis Marquez MRN: [id number] DOB: 05/21/1946 Today's Date: 02/21/2023  History of Present Illness  Curtis Marquez  is a 77 year old male, s/p Open right inguinal hernia repair with mesh on 02/20/23 with extensive history of prostate cancer (prostatectomy), HTN, HLD, GERD, remote history of brain tumor... Presented with chief complaint of abdominal pain.  Describes pain as general diffuse pain started yesterday and progressively getting worse.  Denies any nausea vomiting constipation or diarrhea, any recent illnesses.   Was seen by PCP, sent for abdominal CT.   Clinical Impression  Patient functioning at baseline for functional mobility and gait demonstrating good for ambulating in room/hallway without loss of balance.  Plan:  Patient discharged from physical therapy to care of nursing for ambulation daily as tolerated for length of stay.         Recommendations for follow up therapy are one component of a multi-disciplinary discharge planning process, led by the attending physician.  Recommendations may be updated based on patient status, additional functional criteria and insurance authorization.  Follow Up Recommendations       Assistance Recommended at Discharge PRN  Patient can return home with the following  Help with stairs or ramp for entrance    Equipment Recommendations None recommended by PT  Recommendations for Other Services       Functional Status Assessment Patient has not had a recent decline in their functional status     Precautions / Restrictions Precautions Precautions: None Restrictions Weight Bearing Restrictions: No      Mobility  Bed Mobility Overal bed mobility: Modified Independent                  Transfers Overall transfer level: Independent                      Ambulation/Gait Ambulation/Gait assistance: Modified independent (Device/Increase time) Gait Distance (Feet): 150  Feet Assistive device: None Gait Pattern/deviations: WFL(Within Functional Limits) Gait velocity: near normal     General Gait Details: grossly WFL with good return for ambulation in room, hallway without loss of balance  Stairs            Wheelchair Mobility    Modified Rankin (Stroke Patients Only)       Balance Overall balance assessment: No apparent balance deficits (not formally assessed)                                           Pertinent Vitals/Pain Pain Assessment Pain Assessment: No/denies pain    Home Living Family/patient expects to be discharged to:: Private residence Living Arrangements: Spouse/significant other Available Help at Discharge: Family;Available 24 hours/day Type of Home: House Home Access: Stairs to enter Entrance Stairs-Rails: None Entrance Stairs-Number of Steps: 1 Alternate Level Stairs-Number of Steps: 7 Home Layout: Two level;Bed/bath upstairs Home Equipment: Agricultural consultant (2 wheels);Cane - single point;Grab bars - tub/shower      Prior Function Prior Level of Function : Independent/Modified Independent;Driving             Mobility Comments: Community ambulator without AD, drives ADLs Comments: Independent     Hand Dominance        Extremity/Trunk Assessment   Upper Extremity Assessment Upper Extremity Assessment: Overall WFL for tasks assessed    Lower Extremity Assessment Lower Extremity Assessment: Overall WFL for tasks assessed    Cervical /  Trunk Assessment Cervical / Trunk Assessment: Normal  Communication   Communication: No difficulties  Cognition Arousal/Alertness: Awake/alert Behavior During Therapy: WFL for tasks assessed/performed Overall Cognitive Status: Within Functional Limits for tasks assessed                                          General Comments      Exercises     Assessment/Plan    PT Assessment Patient does not need any further PT services   PT Problem List         PT Treatment Interventions      PT Goals (Current goals can be found in the Care Plan section)  Acute Rehab PT Goals Patient Stated Goal: return home PT Goal Formulation: With patient Time For Goal Achievement: 02/21/23 Potential to Achieve Goals: Good    Frequency       Co-evaluation               AM-PAC PT "6 Clicks" Mobility  Outcome Measure Help needed turning from your back to your side while in a flat bed without using bedrails?: None Help needed moving from lying on your back to sitting on the side of a flat bed without using bedrails?: None Help needed moving to and from a bed to a chair (including a wheelchair)?: None Help needed standing up from a chair using your arms (e.g., wheelchair or bedside chair)?: None Help needed to walk in hospital room?: None Help needed climbing 3-5 steps with a railing? : None 6 Click Score: 24    End of Session   Activity Tolerance: Patient tolerated treatment well Patient left: in bed;with call bell/phone within reach Nurse Communication: Mobility status PT Visit Diagnosis: Unsteadiness on feet (R26.81);Other abnormalities of gait and mobility (R26.89);Muscle weakness (generalized) (M62.81)    Time: 4098-1191 PT Time Calculation (min) (ACUTE ONLY): 12 min   Charges:   PT Evaluation $PT Eval Low Complexity: 1 Low PT Treatments $Therapeutic Activity: 8-22 mins        1:43 PM, 02/21/23 Ocie Bob, MPT Physical Therapist with [facility] [phone] office [phone] mobile phone

## 2023-02-21 NOTE — Progress Notes (Addendum)
1 Day Post-Op  Subjective: Patient reports no pain this morning and tolerated clear liquid diet without any nausea or vomiting. He would like to drink coffee this morning and advance his diet.   Objective: Vital signs in last 24 hours: Temp:  [98 F (36.7 C)-99.2 F (37.3 C)] 99.2 F (37.3 C) (05/01 0200) Pulse Rate:  [88-100] 90 (05/01 0200) Resp:  [11-19] 18 (05/01 0200) BP: (145-167)/(78-92) 147/79 (05/01 0200) SpO2:  [90 %-100 %] 100 % (05/01 0200) Weight:  [74.5 kg] 74.5 kg (05/01 0200) Last BM Date : 02/19/23  Intake/Output from previous day: 04/30 0701 - 05/01 0700 In: 1889.4 [I.V.:1711.5; IV Piggyback:177.9] Out: 20 [Blood:20] Intake/Output this shift: No intake/output data recorded.  Physical Exam Constitutional:      General: He is not in acute distress. HENT:     Mouth/Throat:     Mouth: Mucous membranes are moist.  Cardiovascular:     Heart sounds: Normal heart sounds.  Pulmonary:     Effort: Pulmonary effort is normal. No respiratory distress.  Abdominal:     General: Bowel sounds are normal.     Palpations: Abdomen is soft.     Tenderness: There is no abdominal tenderness.     Comments: Inguinal hernia incision clean/dry/intact  Skin:    General: Skin is warm and dry.  Neurological:     Mental Status: He is alert and oriented to person, place, and time.      Lab Results:  Recent Labs    02/19/23 2306 02/21/23 0420  WBC 20.5* 13.3*  HGB 15.4 11.8*  HCT 46.2 36.6*  PLT 439* 336   BMET Recent Labs    02/19/23 2306 02/21/23 0420  NA 139 140  K 3.0* 3.2*  CL 100 106  CO2 27 26  GLUCOSE 169* 110*  BUN 23 24*  CREATININE 1.14 1.08  CALCIUM 9.7 7.7*   PT/INR Recent Labs    02/20/23 0820  LABPROT 13.3  INR 1.0    Studies/Results: CT ABDOMEN PELVIS W CONTRAST  Result Date: 02/20/2023 CLINICAL DATA:  Abdomen pain EXAM: CT ABDOMEN AND PELVIS WITH CONTRAST IMPRESSION: 1. Findings consistent with small-bowel obstruction secondary to an  incarcerated right inguinal hernia containing mesenteric fat and thickened appearing loop of small bowel. There is moderate fluid within the right groin hernia sac as well as vascular congestion of the herniated and right lower quadrant small bowel mesentery. Small volume free fluid in the pelvis. No free air. Moderate fat containing left inguinal hernia. 2. Aortic atherosclerosis. Aortic Atherosclerosis (ICD10-I70.0). Electronically Signed   By: Jasmine Pang M.D.   On: 02/20/2023 01:25    Anti-infectives: Anti-infectives (From admission, onward)    Start     Dose/Rate Route Frequency Ordered Stop   02/20/23 1100  meropenem (MERREM) 1 g in sodium chloride 0.9 % 100 mL IVPB        1 g 200 mL/hr over 30 Minutes Intravenous Every 8 hours 02/20/23 0951     02/20/23 0900  ertapenem (INVANZ) 1,000 mg in sodium chloride 0.9 % 100 mL IVPB  Status:  Discontinued        1 g 200 mL/hr over 30 Minutes Intravenous Every 24 hours 02/20/23 0814 02/20/23 0951       Assessment/Plan: s/p Procedure(s): HERNIA REPAIR INGUINAL INCARCERATED WITH MESH  White count downtrending, hemoglobin stable. Advance diet to soft, plan for discharge later if patient is tolerating diet.    LOS: 1 day   Signed: Gae Gallop, Medical Student 02/21/2023

## 2023-02-21 NOTE — Progress Notes (Signed)
Discharge and follow-up instructions reviewed with patient and patient's daughter.  Both verbalized understanding of instructions, patient discharged home with daughter in stable condition.

## 2023-02-22 NOTE — Anesthesia Postprocedure Evaluation (Signed)
Anesthesia Post Note  Patient: Curtis Marquez  Procedure(s) Performed: HERNIA REPAIR INGUINAL INCARCERATED WITH MESH (Right: Inguinal)  Patient location during evaluation: Phase II Anesthesia Type: General Level of consciousness: awake Pain management: pain level controlled Vital Signs Assessment: post-procedure vital signs reviewed and stable Respiratory status: spontaneous breathing and respiratory function stable Cardiovascular status: blood pressure returned to baseline and stable Postop Assessment: no headache and no apparent nausea or vomiting Anesthetic complications: no Comments: Late entry   No notable events documented.   Last Vitals:  Vitals:   02/20/23 2015 02/21/23 0200  BP: (!) 151/78 (!) 147/79  Pulse: 91 90  Resp: 18 18  Temp: 37.2 C 37.3 C  SpO2: 99% 100%    Last Pain:  Vitals:   02/21/23 0827  TempSrc:   PainSc: 0-No pain                 Windell Norfolk

## 2023-02-23 ENCOUNTER — Encounter (HOSPITAL_COMMUNITY): Payer: Self-pay | Admitting: Surgery

## 2023-03-01 DIAGNOSIS — Z299 Encounter for prophylactic measures, unspecified: Secondary | ICD-10-CM | POA: Diagnosis not present

## 2023-03-01 DIAGNOSIS — R109 Unspecified abdominal pain: Secondary | ICD-10-CM | POA: Diagnosis not present

## 2023-03-01 DIAGNOSIS — I10 Essential (primary) hypertension: Secondary | ICD-10-CM | POA: Diagnosis not present

## 2023-03-08 ENCOUNTER — Encounter: Payer: Self-pay | Admitting: Surgery

## 2023-03-08 ENCOUNTER — Ambulatory Visit (INDEPENDENT_AMBULATORY_CARE_PROVIDER_SITE_OTHER): Payer: Medicare Other | Admitting: Surgery

## 2023-03-08 VITALS — BP 190/82 | HR 79 | Temp 98.2°F | Resp 14 | Ht 68.0 in | Wt 166.0 lb

## 2023-03-08 DIAGNOSIS — Z09 Encounter for follow-up examination after completed treatment for conditions other than malignant neoplasm: Secondary | ICD-10-CM

## 2023-03-08 NOTE — Progress Notes (Signed)
[facility] Note   HPI:  77 y.o. Male presents to clinic for post-op follow-up status post open right inguinal hernia repair with mesh for an incarcerated right inguinal hernia on 4/30.  He has been doing well since the surgery.  He is taking his as needed pain medication.  He is moving his bowels without issue and tolerating a diet.  He has no complaints related to his incision site.  Denies fevers and chills.  Review of Systems:  All other review of systems: otherwise negative   Vital Signs:  BP (!) 190/82   Pulse 79   Temp 98.2 F (36.8 C) (Oral)   Resp 14   Ht 5\' 8"  (1.727 m)   Wt 166 lb (75.3 kg)   SpO2 97%   BMI 25.24 kg/m    Physical Exam:  Physical Exam Vitals reviewed.  Constitutional:      Appearance: Normal appearance.  Abdominal:     Comments: Abdomen soft, nondistended, no percussion tenderness, nontender to palpation; no rigidity, guarding, rebound tenderness; right inguinal incision healing well with no significant swelling or tenderness; skin glue peeling off  Neurological:     Mental Status: He is alert.     Laboratory studies: None  Imaging:  None  Assessment:  77 y.o. yo Male who presents for follow-up status post open right inguinal hernia repair with mesh on 4/30.  Plan:  -Patient overall doing well since surgery.  Tolerating a diet, moving his bowels, and pain adequately controlled -Advised that he can use antibiotic ointment prior to showering at his incision site to help get the remaining skin glue off -He should try to limit his heavy lifting for an additional 2 weeks.  He can then start increasing his activity -Follow up as needed  All of the above recommendations were discussed with the patient, and all of patient's questions were answered to his expressed satisfaction.  Theophilus Kinds, DO [facility] [address] [place], [place] [address] [phone] (office)

## 2023-05-22 DIAGNOSIS — H40013 Open angle with borderline findings, low risk, bilateral: Secondary | ICD-10-CM | POA: Diagnosis not present

## 2023-06-11 DIAGNOSIS — I10 Essential (primary) hypertension: Secondary | ICD-10-CM | POA: Diagnosis not present

## 2023-06-11 DIAGNOSIS — Z299 Encounter for prophylactic measures, unspecified: Secondary | ICD-10-CM | POA: Diagnosis not present

## 2023-06-18 DIAGNOSIS — Z299 Encounter for prophylactic measures, unspecified: Secondary | ICD-10-CM | POA: Diagnosis not present

## 2023-06-18 DIAGNOSIS — Z7189 Other specified counseling: Secondary | ICD-10-CM | POA: Diagnosis not present

## 2023-06-18 DIAGNOSIS — I10 Essential (primary) hypertension: Secondary | ICD-10-CM | POA: Diagnosis not present

## 2023-06-18 DIAGNOSIS — I739 Peripheral vascular disease, unspecified: Secondary | ICD-10-CM | POA: Diagnosis not present

## 2023-06-18 DIAGNOSIS — Z0000 Encounter for general adult medical examination without abnormal findings: Secondary | ICD-10-CM | POA: Diagnosis not present

## 2023-06-18 DIAGNOSIS — R011 Cardiac murmur, unspecified: Secondary | ICD-10-CM | POA: Diagnosis not present

## 2023-07-02 DIAGNOSIS — R010 Benign and innocent cardiac murmurs: Secondary | ICD-10-CM | POA: Diagnosis not present

## 2023-07-31 DIAGNOSIS — Z299 Encounter for prophylactic measures, unspecified: Secondary | ICD-10-CM | POA: Diagnosis not present

## 2023-07-31 DIAGNOSIS — Z2821 Immunization not carried out because of patient refusal: Secondary | ICD-10-CM | POA: Diagnosis not present

## 2023-07-31 DIAGNOSIS — I739 Peripheral vascular disease, unspecified: Secondary | ICD-10-CM | POA: Diagnosis not present

## 2023-07-31 DIAGNOSIS — I10 Essential (primary) hypertension: Secondary | ICD-10-CM | POA: Diagnosis not present

## 2023-07-31 DIAGNOSIS — Z0000 Encounter for general adult medical examination without abnormal findings: Secondary | ICD-10-CM | POA: Diagnosis not present

## 2023-10-02 DIAGNOSIS — Z299 Encounter for prophylactic measures, unspecified: Secondary | ICD-10-CM | POA: Diagnosis not present

## 2023-10-02 DIAGNOSIS — I10 Essential (primary) hypertension: Secondary | ICD-10-CM | POA: Diagnosis not present

## 2024-02-09 ENCOUNTER — Emergency Department (HOSPITAL_COMMUNITY)
Admission: EM | Admit: 2024-02-09 | Discharge: 2024-02-09 | Disposition: A | Discharge status: Home / Self Care | Attending: Emergency Medicine | Admitting: Emergency Medicine

## 2024-02-09 ENCOUNTER — Emergency Department (HOSPITAL_COMMUNITY)

## 2024-02-09 ENCOUNTER — Encounter (HOSPITAL_COMMUNITY): Payer: Self-pay | Admitting: Emergency Medicine

## 2024-02-09 ENCOUNTER — Other Ambulatory Visit: Payer: Self-pay

## 2024-02-09 DIAGNOSIS — M25512 Pain in left shoulder: Secondary | ICD-10-CM | POA: Diagnosis not present

## 2024-02-09 DIAGNOSIS — I10 Essential (primary) hypertension: Secondary | ICD-10-CM | POA: Diagnosis not present

## 2024-02-09 DIAGNOSIS — M19012 Primary osteoarthritis, left shoulder: Secondary | ICD-10-CM | POA: Diagnosis not present

## 2024-02-09 DIAGNOSIS — M25712 Osteophyte, left shoulder: Secondary | ICD-10-CM | POA: Diagnosis not present

## 2024-02-09 DIAGNOSIS — Z79899 Other long term (current) drug therapy: Secondary | ICD-10-CM | POA: Diagnosis not present

## 2024-02-09 MED ORDER — AMLODIPINE BESYLATE 5 MG PO TABS
10.0000 mg | ORAL_TABLET | Freq: Once | ORAL | Status: AC
  Administered 2024-02-09: 10 mg via ORAL
  Filled 2024-02-09: qty 2

## 2024-02-09 MED ORDER — IBUPROFEN 800 MG PO TABS: 800.0000 mg | ORAL_TABLET | Freq: Three times a day (TID) | ORAL | 0 refills | Status: DC | PRN

## 2024-02-09 MED ORDER — LOSARTAN POTASSIUM 25 MG PO TABS
100.0000 mg | ORAL_TABLET | Freq: Once | ORAL | Status: AC
Start: 2024-02-09 — End: 2024-02-09
  Administered 2024-02-09: 100 mg via ORAL
  Filled 2024-02-09: qty 4

## 2024-02-09 NOTE — ED Triage Notes (Signed)
 Pt to ER with c/o left shoulder pain that radiates into left bicep.  States started on Thursday.  Denies known injury.  States is generally worse in AM.

## 2024-02-09 NOTE — ED Provider Notes (Signed)
 [place] EMERGENCY DEPARTMENT AT [facility] Provider Note   CSN: [id number] Arrival date & time: 02/09/24  1113     History {Add pertinent medical, surgical, social history, OB history to HPI:1} Chief Complaint  Patient presents with   Shoulder Pain    Curtis Marquez is a 78 y.o. male.  Patient was working out in the yard yesterday and now has severe left shoulder pain.  Patient has a history of hypertension and has not taken his medicines today   Shoulder Pain      Home Medications Prior to Admission medications   Medication Sig Start Date End Date Taking? Authorizing Provider  ibuprofen  (ADVIL ) 800 MG tablet Take 1 tablet (800 mg total) by mouth every 8 (eight) hours as needed for moderate pain (pain score 4-6). 02/09/24  Yes Cheyenne Cotta, MD  acetaminophen  (TYLENOL ) 500 MG tablet Take 2 tablets (1,000 mg total) by mouth every 6 (six) hours. 02/21/23   Pappayliou, Bynum Cassis A, DO  amLODipine  (NORVASC ) 10 MG tablet Take 1 tablet (10 mg total) by mouth daily. 02/21/23   Colin Dawley, MD  losartan  (COZAAR ) 100 MG tablet Take 1 tablet (100 mg total) by mouth daily. 02/21/23   Colin Dawley, MD  oxyCODONE  (OXY IR/ROXICODONE ) 5 MG immediate release tablet Take 1 tablet (5 mg total) by mouth every 6 (six) hours as needed for moderate pain. 02/21/23   Pappayliou, Bynum Cassis A, DO  pantoprazole  (PROTONIX ) 40 MG tablet Take 1 tablet (40 mg total) by mouth daily. 02/21/23   Colin Dawley, MD  senna-docusate (SENOKOT-S) 8.6-50 MG tablet Take 2 tablets by mouth at bedtime. 02/21/23   Colin Dawley, MD      Allergies    Patient has no known allergies.    Review of Systems   Review of Systems  Physical Exam Updated Vital Signs BP (!) 198/94   Pulse 81   Temp 98 F (36.7 C)   Resp 18   Ht 5\' 8"  (1.727 m)   Wt 79.4 kg   SpO2 99%   BMI 26.61 kg/m  Physical Exam  ED Results / Procedures / Treatments   Labs (all labs ordered are listed, but only abnormal results are  displayed) Labs Reviewed - No data to display  EKG None  Radiology No results found.  Procedures Procedures  {Document cardiac monitor, telemetry assessment procedure when appropriate:1}  Medications Ordered in ED Medications  amLODipine  (NORVASC ) tablet 10 mg (10 mg Oral Given 02/09/24 1137)  losartan  (COZAAR ) tablet 100 mg (100 mg Oral Given 02/09/24 1137)    ED Course/ Medical Decision Making/ A&P   {   Click here for ABCD2, HEART and other calculatorsREFRESH Note before signing :1}                              Medical Decision Making Amount and/or Complexity of Data Reviewed Radiology: ordered.  Risk Prescription drug management.  Left shoulder strain and hypertension.  Patient was given his daily blood pressure medicine and  has been put on Motrin  for shoulder.  He will follow-up with his PCP this week to check his blood pressure and shoulder  {Document critical care time when appropriate:1} {Document review of labs and clinical decision tools ie heart score, Chads2Vasc2 etc:1}  {Document your independent review of radiology images, and any outside records:1} {Document your discussion with family members, caretakers, and with consultants:1} {Document social determinants of health affecting pt's care:1} {Document your decision  making why or why not admission, treatments were needed:1} Final Clinical Impression(s) / ED Diagnoses Final diagnoses:  Acute pain of left shoulder    Rx / DC Orders ED Discharge Orders          Ordered    ibuprofen  (ADVIL ) 800 MG tablet  Every 8 hours PRN        02/09/24 1328

## 2024-02-09 NOTE — Discharge Instructions (Signed)
 Follow up with your md this week to check your bp and shoulder

## 2024-02-18 DIAGNOSIS — M79602 Pain in left arm: Secondary | ICD-10-CM | POA: Diagnosis not present

## 2024-02-18 DIAGNOSIS — I10 Essential (primary) hypertension: Secondary | ICD-10-CM | POA: Diagnosis not present

## 2024-02-18 DIAGNOSIS — R5383 Other fatigue: Secondary | ICD-10-CM | POA: Diagnosis not present

## 2024-02-18 DIAGNOSIS — Z299 Encounter for prophylactic measures, unspecified: Secondary | ICD-10-CM | POA: Diagnosis not present

## 2024-03-28 DIAGNOSIS — I10 Essential (primary) hypertension: Secondary | ICD-10-CM | POA: Diagnosis not present

## 2024-03-28 DIAGNOSIS — Z8546 Personal history of malignant neoplasm of prostate: Secondary | ICD-10-CM | POA: Diagnosis not present

## 2024-03-28 DIAGNOSIS — R609 Edema, unspecified: Secondary | ICD-10-CM | POA: Diagnosis not present

## 2024-03-28 DIAGNOSIS — Z299 Encounter for prophylactic measures, unspecified: Secondary | ICD-10-CM | POA: Diagnosis not present

## 2024-03-28 DIAGNOSIS — R52 Pain, unspecified: Secondary | ICD-10-CM | POA: Diagnosis not present

## 2024-03-28 DIAGNOSIS — R600 Localized edema: Secondary | ICD-10-CM | POA: Diagnosis not present

## 2024-04-01 DIAGNOSIS — R5383 Other fatigue: Secondary | ICD-10-CM | POA: Diagnosis not present

## 2024-04-01 DIAGNOSIS — Z299 Encounter for prophylactic measures, unspecified: Secondary | ICD-10-CM | POA: Diagnosis not present

## 2024-04-01 DIAGNOSIS — Z79899 Other long term (current) drug therapy: Secondary | ICD-10-CM | POA: Diagnosis not present

## 2024-04-01 DIAGNOSIS — Z7189 Other specified counseling: Secondary | ICD-10-CM | POA: Diagnosis not present

## 2024-04-01 DIAGNOSIS — E7800 Pure hypercholesterolemia, unspecified: Secondary | ICD-10-CM | POA: Diagnosis not present

## 2024-04-01 DIAGNOSIS — Z0000 Encounter for general adult medical examination without abnormal findings: Secondary | ICD-10-CM | POA: Diagnosis not present

## 2024-04-01 DIAGNOSIS — I700 Atherosclerosis of aorta: Secondary | ICD-10-CM | POA: Diagnosis not present

## 2024-04-01 DIAGNOSIS — Z125 Encounter for screening for malignant neoplasm of prostate: Secondary | ICD-10-CM | POA: Diagnosis not present

## 2024-04-01 DIAGNOSIS — Z1331 Encounter for screening for depression: Secondary | ICD-10-CM | POA: Diagnosis not present

## 2024-04-01 DIAGNOSIS — Z1339 Encounter for screening examination for other mental health and behavioral disorders: Secondary | ICD-10-CM | POA: Diagnosis not present

## 2024-04-07 DIAGNOSIS — C61 Malignant neoplasm of prostate: Secondary | ICD-10-CM | POA: Diagnosis not present

## 2024-04-07 DIAGNOSIS — Z299 Encounter for prophylactic measures, unspecified: Secondary | ICD-10-CM | POA: Diagnosis not present

## 2024-04-07 DIAGNOSIS — E7800 Pure hypercholesterolemia, unspecified: Secondary | ICD-10-CM | POA: Diagnosis not present

## 2024-04-07 DIAGNOSIS — I10 Essential (primary) hypertension: Secondary | ICD-10-CM | POA: Diagnosis not present

## 2024-05-06 DIAGNOSIS — C61 Malignant neoplasm of prostate: Secondary | ICD-10-CM | POA: Diagnosis not present

## 2024-05-08 ENCOUNTER — Other Ambulatory Visit (HOSPITAL_COMMUNITY): Payer: Self-pay | Admitting: Urology

## 2024-05-08 DIAGNOSIS — C61 Malignant neoplasm of prostate: Secondary | ICD-10-CM

## 2024-05-22 DIAGNOSIS — C61 Malignant neoplasm of prostate: Secondary | ICD-10-CM | POA: Diagnosis not present

## 2024-05-22 DIAGNOSIS — M25472 Effusion, left ankle: Secondary | ICD-10-CM | POA: Diagnosis not present

## 2024-05-22 DIAGNOSIS — H40012 Open angle with borderline findings, low risk, left eye: Secondary | ICD-10-CM | POA: Diagnosis not present

## 2024-05-22 DIAGNOSIS — N182 Chronic kidney disease, stage 2 (mild): Secondary | ICD-10-CM | POA: Diagnosis not present

## 2024-05-22 DIAGNOSIS — M25471 Effusion, right ankle: Secondary | ICD-10-CM | POA: Diagnosis not present

## 2024-05-22 DIAGNOSIS — R0602 Shortness of breath: Secondary | ICD-10-CM | POA: Diagnosis not present

## 2024-05-22 DIAGNOSIS — Z299 Encounter for prophylactic measures, unspecified: Secondary | ICD-10-CM | POA: Diagnosis not present

## 2024-05-22 DIAGNOSIS — I10 Essential (primary) hypertension: Secondary | ICD-10-CM | POA: Diagnosis not present

## 2024-05-26 ENCOUNTER — Encounter (HOSPITAL_COMMUNITY)
Admission: RE | Admit: 2024-05-26 | Discharge: 2024-05-26 | Disposition: A | Discharge status: Home / Self Care | Payer: Self-pay | Source: Ambulatory Visit | Attending: Urology | Admitting: Urology

## 2024-05-26 DIAGNOSIS — C7951 Secondary malignant neoplasm of bone: Secondary | ICD-10-CM | POA: Diagnosis not present

## 2024-05-26 DIAGNOSIS — C61 Malignant neoplasm of prostate: Secondary | ICD-10-CM | POA: Insufficient documentation

## 2024-05-26 MED ORDER — FLOTUFOLASTAT F 18 GALLIUM 296-5846 MBQ/ML IV SOLN
8.7800 | Freq: Once | INTRAVENOUS | Status: AC
  Administered 2024-05-26: 8.78 via INTRAVENOUS

## 2024-06-12 DIAGNOSIS — M25471 Effusion, right ankle: Secondary | ICD-10-CM | POA: Diagnosis not present

## 2024-06-12 DIAGNOSIS — M25472 Effusion, left ankle: Secondary | ICD-10-CM | POA: Diagnosis not present

## 2024-06-12 DIAGNOSIS — Z299 Encounter for prophylactic measures, unspecified: Secondary | ICD-10-CM | POA: Diagnosis not present

## 2024-06-12 DIAGNOSIS — I10 Essential (primary) hypertension: Secondary | ICD-10-CM | POA: Diagnosis not present

## 2024-06-12 DIAGNOSIS — C61 Malignant neoplasm of prostate: Secondary | ICD-10-CM | POA: Diagnosis not present

## 2024-06-24 DIAGNOSIS — C61 Malignant neoplasm of prostate: Secondary | ICD-10-CM | POA: Diagnosis not present

## 2024-07-09 ENCOUNTER — Encounter: Payer: Self-pay | Admitting: Urology

## 2024-07-09 ENCOUNTER — Telehealth: Payer: Self-pay | Admitting: Radiation Oncology

## 2024-07-09 NOTE — Telephone Encounter (Signed)
 Attempted to call patient to schedule consult per 9/16 referral. Busy signal. Will try again at a later time.

## 2024-08-01 NOTE — Progress Notes (Addendum)
 Histology and Location of Primary Cancer: Prostate Ca (biochemically recurrent)  PSA is (26.8 on 05/06/2024)  Prostatectomy  (10/2013)  Sites of Visceral and Bony Metastatic Disease: Right lateral rib & abdominal /upper pelvic  Location(s) of Symptomatic Metastases: Right lateral rib & abdominal /upper pelvic   Curtis Marquez presented as referral from Dr. Gretel Ferrara ([facility]) elevated PSA.   05/26/2024 Dr. Gretel Ferrara NM PET ([facility]MA) Skull to Mid Thigh CLINICAL DATA:  Prostate cancer diagnosed in 2021 with radiation therapy. PSA of 26.8   IMPRESSION: 1. Abdominal and upper pelvic tracer avid nodal metastasis. 2. Right lateral fourth rib isolated osseous metastasis 3. Foci of tracer affinity at the thoracic inlet and low neck are primarily felt to represent physiologic ganglia. Cannot exclude concurrent isolated low right cervical nodal metastasis. 4. Incidental findings, including: Coronary artery atherosclerosis. Aortic Atherosclerosis (ICD10-I70.0). Fat containing left inguinal hernia.   Past/Anticipated interventions by urology, if any:  Dr. Gretel Ferrara    Past/Anticipated interventions by medical oncology, if any:  NA  Weight changes, if any:  No  IPSS:  12  Bowel/Bladder complaints, if any:  No  Nausea/Vomiting, if any:  No  Pain issues, if any:  0/10  If Spine Met(s), symptoms, if any, include: Bowel/Bladder retention or incontinence (please describe): No Numbness or weakness in extremities (please describe):  arthritis bilateral shoulders. Current Decadron  regimen, if applicable: No   SAFETY ISSUES: Prior radiation? Yes, ADT with salvage radiation therapy in 2016-2017. Pacemaker/ICD? No Possible current pregnancy? Male Is the patient on methotrexate? No  Current Complaints / other details: None  30 minutes spent total, including time for meaningful use questions, reviewing medication, as well as spent in face-to-face time in nurse  evaluation with the patient.

## 2024-08-04 NOTE — Progress Notes (Signed)
 Radiation Oncology         [phone] ________________________________  Outpatient Consultation  Name: Curtis Marquez MRN: [id number]  Date of Service: 08/05/2024 DOB: [birth year]  RR:Dyjy, Eligio, MD  Renda Glance, MD   REFERRING PHYSICIAN: Renda Glance, MD  DIAGNOSIS: 78 y.o. 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    ICD-10-CM   1. Malignant neoplasm of prostate (HCC)  C61     2. Prostate cancer (HCC)  C61     3. Malignant neoplasm of prostate metastatic to bone (HCC)  C61    C79.51     4. Malignant neoplasm of prostate metastatic to intra-abdominal lymph node (HCC)  C61    C77.2     5. Malignant neoplasm of prostate metastatic to intrapelvic lymph node (HCC)  C61    C77.5       HISTORY OF PRESENT ILLNESS: Curtis Marquez is a 78 y.o. male seen at the request of Dr. Renda for his metastatic prostate cancer. In summary, he was initially diagnosed with Gleason 4+4 prostate cancer on biopsy in 09/2013 with a PSA of 12.3. He opted to proceed with RALP on 11/13/13 under the care of of Dr. Renda, which confirmed pT2cN0, Gleason 4+3 and no high risk features on final pathology. His PSA postoperatively was 0.08 but increased to 0.24 in 04/2015. He was evaluated in our multidisciplinary prostate cancer clinic on 05/18/15.  He initially elected to proceed with PSA surveillance but was referred back to us  in 10/2015 and completed a course of salvage radiotherapy to the prostatic fossa in 12/2015 at [facility] in [place], concurrent with ST-ADT. He completed 34 of a total planned 38 treatments but did not complete the last few treatments due to a syncopal episode that required hospitalization. His PSA following salvage radiotherapy subsequently became undetectable but increased to 0.18 in 06/2017. Following this, he was lost to follow up with urology. Recently, he was noted to have a significantly elevated PSA of 26.8 by his  PCP, Dr. Maree. Accordingly, he was referred back to Dr. Renda on 05/06/24 for further evaluation. He underwent a restaging PSMA PET scan on 05/26/24, which revealed abdominal and upper pelvic tracer-avid nodal metastasis as well as a solitary right lateral 4th rib metastasis.  There was a foci of tracer affinity at the thoracic inlet and low neck, primarily felt to represent physiologic ganglia but cannot exclude concurrent isolated low right cervical nodal metastasis though unlikely.   He has reviewed the results of the recent PSMA PET imaging and PSA results with his urologist and has been kindly referred back to us  today to discuss the potential role for metastasis directed therapy.  PREVIOUS RADIATION THERAPY: Yes  12/2015: Salvage radiotherapy to the prostatic fossa at [facility] in [place] under the care of Dr. Patrcia  PAST MEDICAL HISTORY:  Past Medical History:  Diagnosis Date   Brain tumor [facility])    GERD (gastroesophageal reflux disease)    Hypercholesteremia    Hypertension    Prostate cancer (HCC) 11/13/13      PAST SURGICAL HISTORY: Past Surgical History:  Procedure Laterality Date   ARTERY BIOPSY Left 02/28/2021   Procedure: BIOPSY TEMPORAL ARTERY;  Surgeon: Kallie Manuelita BROCKS, MD;  Location: [facility];  Service: General;  Laterality: Left;   BRAIN TUMOR EXCISION  10/23/1992   ESOPHAGOGASTRODUODENOSCOPY N/A 03/18/2019   food impaction, benign-appearing esophageal stenosis, mild gastritis.   ESOPHAGOGASTRODUODENOSCOPY N/A 03/24/2019   one benign-appearing, intrinsic moderate  stenosis was fond s/p dilation. Mild gastritis.   INGUINAL HERNIA REPAIR Right 02/20/2023   Procedure: HERNIA REPAIR INGUINAL INCARCERATED WITH MESH;  Surgeon: Evonnie Dorothyann LABOR, DO;  Location: [facility];  Service: General;  Laterality: Right;   LYMPHADENECTOMY Bilateral 11/13/2013   Procedure: LYMPHADENECTOMY PELVIC LYMPH NODE DISSECTION;  Surgeon: Noretta Ferrara, MD;  Location: [facility];  Service: Urology;   Laterality: Bilateral;   ROBOT ASSISTED LAPAROSCOPIC RADICAL PROSTATECTOMY N/A 11/13/2013   Procedure: ROBOTIC ASSISTED LAPAROSCOPIC RADICAL PROSTATECTOMY LEVEL 2;  Surgeon: Noretta Ferrara, MD;  Location: [facility];  Service: Urology;  Laterality: N/A;   SAVORY DILATION N/A 03/24/2019   Procedure: SAVORY DILATION;  Surgeon: Harvey Margo CROME, MD;  Location: [facility];  Service: Endoscopy;  Laterality: N/A;    FAMILY HISTORY:  Family History  Problem Relation Age of Onset   Diabetes Mother    Hypertension Mother    CVA Father    Diabetes Sister    Hypertension Sister    Cancer Brother    Colon cancer Neg Hx    Colon polyps Neg Hx     SOCIAL HISTORY:  Social History   Socioeconomic History   Marital status: Married    Spouse name: Not on file   Number of children: Not on file   Years of education: Not on file   Highest education level: Not on file  Occupational History   Not on file  Tobacco Use   Smoking status: Former   Smokeless tobacco: Never  Vaping Use   Vaping status: Never Used  Substance and Sexual Activity   Alcohol  use: No    Alcohol /week: 0.0 standard drinks of alcohol    Drug use: No   Sexual activity: Not on file  Other Topics Concern   Not on file  Social History Narrative   Not on file   Social Drivers of Health   Financial Resource Strain: Not on file  Food Insecurity: No Food Insecurity (08/05/2024)   Hunger Vital Sign    Worried About Running Out of Food in the Last Year: Never true    Ran Out of Food in the Last Year: Never true  Transportation Needs: No Transportation Needs (08/05/2024)   PRAPARE - Administrator, Civil Service (Medical): No    Lack of Transportation (Non-Medical): No  Physical Activity: Not on file  Stress: Not on file  Social Connections: Not on file  Intimate Partner Violence: Not At Risk (08/05/2024)   Humiliation, Afraid, Rape, and Kick questionnaire    Fear of Current or Ex-Partner: No    Emotionally Abused:  No    Physically Abused: No    Sexually Abused: No    ALLERGIES: Patient has no known allergies.  MEDICATIONS:  Current Outpatient Medications  Medication Sig Dispense Refill   furosemide (LASIX) 20 MG tablet Take 20 mg by mouth daily.     hydrochlorothiazide  (HYDRODIURIL ) 25 MG tablet Take 25 mg by mouth daily.     losartan  (COZAAR ) 50 MG tablet Take 50 mg by mouth daily.     senna-docusate (SENOKOT-S) 8.6-50 MG tablet Take 2 tablets by mouth at bedtime. (Patient taking differently: Take 2 tablets by mouth as needed for mild constipation or moderate constipation.) 60 tablet 1   acetaminophen  (TYLENOL ) 500 MG tablet Take 2 tablets (1,000 mg total) by mouth every 6 (six) hours. 30 tablet 0   ibuprofen  (ADVIL ) 800 MG tablet Take 1 tablet (800 mg total) by mouth every 8 (eight) hours as needed  for moderate pain (pain score 4-6). 21 tablet 0   No current facility-administered medications for this encounter.    REVIEW OF SYSTEMS:  On review of systems, the patient reports that he is doing well overall. He denies any chest pain, shortness of breath, cough, fevers, chills, night sweats, unintended weight changes. He denies any bowel or bladder disturbances, and denies abdominal pain, nausea or vomiting. He denies any new musculoskeletal or joint aches or pains. A complete review of systems is obtained and is otherwise negative.    PHYSICAL EXAM:  Wt Readings from Last 3 Encounters:  08/05/24 179 lb 3.2 oz (81.3 kg)  02/09/24 175 lb (79.4 kg)  03/08/23 166 lb (75.3 kg)   Temp Readings from Last 3 Encounters:  08/05/24 97.8 F (36.6 C)  02/09/24 98 F (36.7 C)  03/08/23 98.2 F (36.8 C) (Oral)   BP Readings from Last 3 Encounters:  08/05/24 (!) 208/96  02/09/24 (!) 194/105  03/08/23 (!) 190/82   Pulse Readings from Last 3 Encounters:  08/05/24 91  02/09/24 67  03/08/23 79   Pain Assessment Pain Score: 0-No pain/10  In general this is a well appearing African-American man in  no acute distress. He's alert and oriented x4 and appropriate throughout the examination. Cardiopulmonary assessment is negative for acute distress and he exhibits normal effort.     KPS = 100  100 - Normal; no complaints; no evidence of disease. 90   - Able to carry on normal activity; minor signs or symptoms of disease. 80   - Normal activity with effort; some signs or symptoms of disease. 43   - Cares for self; unable to carry on normal activity or to do active work. 60   - Requires occasional assistance, but is able to care for most of his personal needs. 50   - Requires considerable assistance and frequent medical care. 40   - Disabled; requires special care and assistance. 30   - Severely disabled; hospital admission is indicated although death not imminent. 20   - Very sick; hospital admission necessary; active supportive treatment necessary. 10   - Moribund; fatal processes progressing rapidly. 0     - Dead  Karnofsky DA, Abelmann WH, Craver LS and Burchenal [facility] [phone]) The use of the nitrogen mustards in the palliative treatment of carcinoma: with particular reference to bronchogenic carcinoma Cancer 1 [phone]  LABORATORY DATA:  Lab Results  Component Value Date   WBC 13.3 (H) 02/21/2023   HGB 11.8 (L) 02/21/2023   HCT 36.6 (L) 02/21/2023   MCV 89.3 02/21/2023   PLT 336 02/21/2023   Lab Results  Component Value Date   NA 140 02/21/2023   K 3.2 (L) 02/21/2023   CL 106 02/21/2023   CO2 26 02/21/2023   Lab Results  Component Value Date   ALT 28 02/19/2023   AST 21 02/19/2023   ALKPHOS 116 02/19/2023   BILITOT 1.6 (H) 02/19/2023     RADIOGRAPHY: No results found.    IMPRESSION/PLAN: 1. 78 y.o. 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  Today, we talked to the patient about the findings and workup thus far. We discussed the natural history of prostate cancer and general treatment, highlighting the  role of radiotherapy in the management of oligometastatic disease. We discussed the available radiation techniques, and focused on the details and logistics of delivery.  The recommendation is for a 10 fraction course of ultra hypofractionated radiotherapy (  UHRT) to the abdominopelvic lymph nodes and a 5 fraction course of stereotactic body radiotherapy (SBRT) to the isolated rib metastasis.  We reviewed the anticipated acute and late sequelae associated with radiation in this setting. The patient was encouraged to ask questions that were answered to his stated satisfaction.  At the end of our discussion, the patient is in agreement to proceed with the recommended 10 fraction course of ultra hypofractionated radiotherapy (UHRT) to the abdominopelvic lymph nodes and a 5 fraction course of stereotactic body radiotherapy (SBRT) to the isolated rib metastasis.  He has freely signed written consent to proceed today in the office and a copy of this document would be placed in his medical record.  We will share our discussion with Dr. Renda and proceed with CT simulation/treatment planning as tentatively scheduled at 8 AM on Wednesday, 08/13/2024.  We enjoyed meeting with him again today and look forward to continuing to participate in his care.  He knows that he is welcome to call at anytime in the interim with any questions or concerns related to the treatment plan discussed today.  We personally spent 70 minutes in this encounter including chart review, reviewing radiological studies, meeting face-to-face with the patient, entering orders and completing documentation.    Sabra MICAEL Rusk, PA-C    Donnice Barge, MD  [facility]  Radiation Oncology Direct Dial: [phone]  Fax: [phone] [email or web address]  Skype  [email or web address]   This document serves as a record of services personally performed by Donnice Barge, MD and Sabra Rusk, PA-C. It was created on their behalf by Izetta Neither, a  trained medical scribe. The creation of this record is based on the scribe's personal observations and the provider's statements to them. This document has been checked and approved by the attending provider.

## 2024-08-05 ENCOUNTER — Ambulatory Visit
Admission: RE | Admit: 2024-08-05 | Discharge: 2024-08-05 | Disposition: A | Discharge status: Home / Self Care | Source: Ambulatory Visit | Attending: Radiation Oncology | Admitting: Radiation Oncology

## 2024-08-05 ENCOUNTER — Encounter: Payer: Self-pay | Admitting: Radiation Oncology

## 2024-08-05 VITALS — BP 208/96 | HR 91 | Temp 97.8°F | Resp 20 | Ht 68.0 in | Wt 179.2 lb

## 2024-08-05 DIAGNOSIS — C7951 Secondary malignant neoplasm of bone: Secondary | ICD-10-CM | POA: Diagnosis not present

## 2024-08-05 DIAGNOSIS — I10 Essential (primary) hypertension: Secondary | ICD-10-CM | POA: Diagnosis not present

## 2024-08-05 DIAGNOSIS — Z79899 Other long term (current) drug therapy: Secondary | ICD-10-CM | POA: Diagnosis not present

## 2024-08-05 DIAGNOSIS — Z923 Personal history of irradiation: Secondary | ICD-10-CM | POA: Diagnosis not present

## 2024-08-05 DIAGNOSIS — Z87891 Personal history of nicotine dependence: Secondary | ICD-10-CM | POA: Diagnosis not present

## 2024-08-05 DIAGNOSIS — Z809 Family history of malignant neoplasm, unspecified: Secondary | ICD-10-CM | POA: Diagnosis not present

## 2024-08-05 DIAGNOSIS — C61 Malignant neoplasm of prostate: Secondary | ICD-10-CM | POA: Diagnosis not present

## 2024-08-05 DIAGNOSIS — K219 Gastro-esophageal reflux disease without esophagitis: Secondary | ICD-10-CM | POA: Diagnosis not present

## 2024-08-05 DIAGNOSIS — Z191 Hormone sensitive malignancy status: Secondary | ICD-10-CM | POA: Diagnosis not present

## 2024-08-05 DIAGNOSIS — C778 Secondary and unspecified malignant neoplasm of lymph nodes of multiple regions: Secondary | ICD-10-CM | POA: Diagnosis not present

## 2024-08-05 DIAGNOSIS — C775 Secondary and unspecified malignant neoplasm of intrapelvic lymph nodes: Secondary | ICD-10-CM | POA: Diagnosis not present

## 2024-08-08 DIAGNOSIS — C61 Malignant neoplasm of prostate: Secondary | ICD-10-CM | POA: Insufficient documentation

## 2024-08-11 DIAGNOSIS — C7951 Secondary malignant neoplasm of bone: Secondary | ICD-10-CM | POA: Diagnosis not present

## 2024-08-11 DIAGNOSIS — C61 Malignant neoplasm of prostate: Secondary | ICD-10-CM | POA: Diagnosis not present

## 2024-08-11 DIAGNOSIS — C775 Secondary and unspecified malignant neoplasm of intrapelvic lymph nodes: Secondary | ICD-10-CM | POA: Diagnosis not present

## 2024-08-13 ENCOUNTER — Ambulatory Visit
Admission: RE | Admit: 2024-08-13 | Discharge: 2024-08-13 | Disposition: A | Discharge status: Home / Self Care | Source: Ambulatory Visit | Attending: Radiation Oncology | Admitting: Radiation Oncology

## 2024-08-13 DIAGNOSIS — C7951 Secondary malignant neoplasm of bone: Secondary | ICD-10-CM | POA: Diagnosis not present

## 2024-08-13 DIAGNOSIS — C61 Malignant neoplasm of prostate: Secondary | ICD-10-CM | POA: Diagnosis not present

## 2024-08-13 DIAGNOSIS — Z510 Encounter for antineoplastic radiation therapy: Secondary | ICD-10-CM | POA: Insufficient documentation

## 2024-08-13 DIAGNOSIS — C772 Secondary and unspecified malignant neoplasm of intra-abdominal lymph nodes: Secondary | ICD-10-CM | POA: Diagnosis not present

## 2024-08-13 DIAGNOSIS — C775 Secondary and unspecified malignant neoplasm of intrapelvic lymph nodes: Secondary | ICD-10-CM

## 2024-08-13 NOTE — Progress Notes (Signed)
  Radiation Oncology         [phone] ________________________________  Name: Cheskel Silverio MRN: [id number]  Date: 08/13/2024  DOB: 1946-01-26  ULTRAHYPOFRACTIONATED RADIOTHERAPY  SIMULATION AND TREATMENT PLANNING NOTE    ICD-10-CM   1. Malignant neoplasm of prostate metastatic to bone (HCC)  C61    C79.51     2. Malignant neoplasm of prostate metastatic to intra-abdominal lymph node (HCC)  C61    C77.2     3. Malignant neoplasm of prostate metastatic to intrapelvic lymph node (HCC)  C61    C77.5       DIAGNOSIS:  78 y.o. man with oligometastatic prostate cancer involving abdominopelvic lymph nodes and a solitary ateral right 4th rib lesion.   NARRATIVE:  The patient was brought to the CT Simulation planning suite.  Identity was confirmed.  All relevant records and images related to the planned course of therapy were reviewed.  The patient freely provided informed written consent to proceed with treatment after reviewing the details related to the planned course of therapy. The consent form was witnessed and verified by the simulation staff.  Then, the patient was set-up in a stable reproducible  supine position for radiation therapy.  A BodyFix immobilization pillow was fabricated for reproducible positioning.  Surface markings were placed.  The CT images were loaded into the planning software.  The gross target volumes (GTV) and planning target volumes (PTV) were delinieated, and avoidance structures were contoured.  Treatment planning then occurred.  The radiation prescription was entered and confirmed.  A total of two complex treatment devices were fabricated in the form of the BodyFix immobilization pillow and a neck accuform cushion.  I have requested : 3D Simulation  I have requested a DVH of the following structures: targets and all normal structures near the target including lungs, heart, spinal cord, skin and others as noted on the radiation plan to maintain doses in adherence with  established limits  SPECIAL TREATMENT PROCEDURE:  The planned course of therapy using radiation constitutes a special treatment procedure. Special care is required in the management of this patient for the following reasons. High dose per fraction requiring special monitoring for increased toxicities of treatment including daily imaging..  The special nature of the planned course of radiotherapy will require increased physician supervision and oversight to ensure patient's safety with optimal treatment outcomes.    This requires extended time and effort.    PLAN:  The patient will receive 50 Gy in 10 fractions to the PET positive abdominopelvic lymph nodes and nearby nodal echelons while simultaneously treating the isolated lateral right 4th rib metastasis to 50 Gy in 5 fractions of stereotactic body radiotherapy (SBRT).    ________________________________  Donnice FELIX Patrcia, M.D.

## 2024-08-21 DIAGNOSIS — C7951 Secondary malignant neoplasm of bone: Secondary | ICD-10-CM | POA: Diagnosis not present

## 2024-08-21 DIAGNOSIS — Z510 Encounter for antineoplastic radiation therapy: Secondary | ICD-10-CM | POA: Diagnosis not present

## 2024-08-21 DIAGNOSIS — C775 Secondary and unspecified malignant neoplasm of intrapelvic lymph nodes: Secondary | ICD-10-CM | POA: Diagnosis not present

## 2024-08-21 DIAGNOSIS — C61 Malignant neoplasm of prostate: Secondary | ICD-10-CM | POA: Diagnosis not present

## 2024-08-26 ENCOUNTER — Other Ambulatory Visit: Payer: Self-pay

## 2024-08-26 ENCOUNTER — Ambulatory Visit
Admission: RE | Admit: 2024-08-26 | Discharge: 2024-08-26 | Disposition: A | Discharge status: Home / Self Care | Source: Ambulatory Visit | Attending: Radiation Oncology | Admitting: Radiation Oncology

## 2024-08-26 DIAGNOSIS — C61 Malignant neoplasm of prostate: Secondary | ICD-10-CM | POA: Insufficient documentation

## 2024-08-26 DIAGNOSIS — Z510 Encounter for antineoplastic radiation therapy: Secondary | ICD-10-CM | POA: Diagnosis not present

## 2024-08-26 DIAGNOSIS — C7951 Secondary malignant neoplasm of bone: Secondary | ICD-10-CM | POA: Insufficient documentation

## 2024-08-26 DIAGNOSIS — C772 Secondary and unspecified malignant neoplasm of intra-abdominal lymph nodes: Secondary | ICD-10-CM | POA: Insufficient documentation

## 2024-08-26 DIAGNOSIS — C775 Secondary and unspecified malignant neoplasm of intrapelvic lymph nodes: Secondary | ICD-10-CM | POA: Diagnosis not present

## 2024-08-26 LAB — RAD ONC ARIA SESSION SUMMARY
Course Elapsed Days: 0
Plan Fractions Treated to Date: 1
Plan Fractions Treated to Date: 1
Plan Prescribed Dose Per Fraction: 10 Gy
Plan Prescribed Dose Per Fraction: 5 Gy
Plan Total Fractions Prescribed: 10
Plan Total Fractions Prescribed: 5
Plan Total Prescribed Dose: 50 Gy
Plan Total Prescribed Dose: 50 Gy
Reference Point Dosage Given to Date: 10 Gy
Reference Point Dosage Given to Date: 5 Gy
Reference Point Session Dosage Given: 10 Gy
Reference Point Session Dosage Given: 5 Gy
Session Number: 1

## 2024-08-27 ENCOUNTER — Ambulatory Visit
Admission: RE | Admit: 2024-08-27 | Discharge: 2024-08-27 | Disposition: A | Discharge status: Home / Self Care | Source: Ambulatory Visit | Attending: Radiation Oncology | Admitting: Radiation Oncology

## 2024-08-27 ENCOUNTER — Other Ambulatory Visit: Payer: Self-pay

## 2024-08-27 DIAGNOSIS — C775 Secondary and unspecified malignant neoplasm of intrapelvic lymph nodes: Secondary | ICD-10-CM | POA: Diagnosis not present

## 2024-08-27 DIAGNOSIS — Z510 Encounter for antineoplastic radiation therapy: Secondary | ICD-10-CM | POA: Diagnosis not present

## 2024-08-27 DIAGNOSIS — C7951 Secondary malignant neoplasm of bone: Secondary | ICD-10-CM | POA: Diagnosis not present

## 2024-08-27 DIAGNOSIS — C61 Malignant neoplasm of prostate: Secondary | ICD-10-CM | POA: Diagnosis not present

## 2024-08-27 LAB — RAD ONC ARIA SESSION SUMMARY
Course Elapsed Days: 1
Plan Fractions Treated to Date: 2
Plan Prescribed Dose Per Fraction: 5 Gy
Plan Total Fractions Prescribed: 10
Plan Total Prescribed Dose: 50 Gy
Reference Point Dosage Given to Date: 10 Gy
Reference Point Session Dosage Given: 5 Gy
Session Number: 2

## 2024-08-28 ENCOUNTER — Other Ambulatory Visit: Payer: Self-pay

## 2024-08-28 ENCOUNTER — Ambulatory Visit
Admission: RE | Admit: 2024-08-28 | Discharge: 2024-08-28 | Disposition: A | Discharge status: Home / Self Care | Source: Ambulatory Visit | Attending: Radiation Oncology | Admitting: Radiation Oncology

## 2024-08-28 DIAGNOSIS — C61 Malignant neoplasm of prostate: Secondary | ICD-10-CM | POA: Diagnosis not present

## 2024-08-28 DIAGNOSIS — C7951 Secondary malignant neoplasm of bone: Secondary | ICD-10-CM | POA: Diagnosis not present

## 2024-08-28 DIAGNOSIS — C775 Secondary and unspecified malignant neoplasm of intrapelvic lymph nodes: Secondary | ICD-10-CM | POA: Diagnosis not present

## 2024-08-28 DIAGNOSIS — Z510 Encounter for antineoplastic radiation therapy: Secondary | ICD-10-CM | POA: Diagnosis not present

## 2024-08-28 LAB — RAD ONC ARIA SESSION SUMMARY
Course Elapsed Days: 2
Plan Fractions Treated to Date: 2
Plan Fractions Treated to Date: 3
Plan Prescribed Dose Per Fraction: 10 Gy
Plan Prescribed Dose Per Fraction: 5 Gy
Plan Total Fractions Prescribed: 10
Plan Total Fractions Prescribed: 5
Plan Total Prescribed Dose: 50 Gy
Plan Total Prescribed Dose: 50 Gy
Reference Point Dosage Given to Date: 15 Gy
Reference Point Dosage Given to Date: 20 Gy
Reference Point Session Dosage Given: 10 Gy
Reference Point Session Dosage Given: 5 Gy
Session Number: 3

## 2024-08-29 ENCOUNTER — Ambulatory Visit
Admission: RE | Admit: 2024-08-29 | Discharge: 2024-08-29 | Disposition: A | Discharge status: Home / Self Care | Source: Ambulatory Visit | Attending: Radiation Oncology | Admitting: Radiation Oncology

## 2024-08-29 ENCOUNTER — Other Ambulatory Visit: Payer: Self-pay

## 2024-08-29 DIAGNOSIS — Z510 Encounter for antineoplastic radiation therapy: Secondary | ICD-10-CM | POA: Diagnosis not present

## 2024-08-29 DIAGNOSIS — C775 Secondary and unspecified malignant neoplasm of intrapelvic lymph nodes: Secondary | ICD-10-CM | POA: Diagnosis not present

## 2024-08-29 DIAGNOSIS — C61 Malignant neoplasm of prostate: Secondary | ICD-10-CM | POA: Diagnosis not present

## 2024-08-29 DIAGNOSIS — C7951 Secondary malignant neoplasm of bone: Secondary | ICD-10-CM | POA: Diagnosis not present

## 2024-08-29 LAB — RAD ONC ARIA SESSION SUMMARY
Course Elapsed Days: 3
Plan Fractions Treated to Date: 4
Plan Prescribed Dose Per Fraction: 5 Gy
Plan Total Fractions Prescribed: 10
Plan Total Prescribed Dose: 50 Gy
Reference Point Dosage Given to Date: 20 Gy
Reference Point Session Dosage Given: 5 Gy
Session Number: 4

## 2024-09-01 ENCOUNTER — Ambulatory Visit
Admission: RE | Admit: 2024-09-01 | Discharge: 2024-09-01 | Disposition: A | Discharge status: Home / Self Care | Source: Ambulatory Visit | Attending: Radiation Oncology | Admitting: Radiation Oncology

## 2024-09-01 ENCOUNTER — Other Ambulatory Visit: Payer: Self-pay

## 2024-09-01 DIAGNOSIS — C61 Malignant neoplasm of prostate: Secondary | ICD-10-CM | POA: Diagnosis not present

## 2024-09-01 DIAGNOSIS — Z510 Encounter for antineoplastic radiation therapy: Secondary | ICD-10-CM | POA: Diagnosis not present

## 2024-09-01 DIAGNOSIS — C775 Secondary and unspecified malignant neoplasm of intrapelvic lymph nodes: Secondary | ICD-10-CM | POA: Diagnosis not present

## 2024-09-01 DIAGNOSIS — C7951 Secondary malignant neoplasm of bone: Secondary | ICD-10-CM | POA: Diagnosis not present

## 2024-09-01 LAB — RAD ONC ARIA SESSION SUMMARY
Course Elapsed Days: 6
Plan Fractions Treated to Date: 3
Plan Fractions Treated to Date: 5
Plan Prescribed Dose Per Fraction: 10 Gy
Plan Prescribed Dose Per Fraction: 5 Gy
Plan Total Fractions Prescribed: 10
Plan Total Fractions Prescribed: 5
Plan Total Prescribed Dose: 50 Gy
Plan Total Prescribed Dose: 50 Gy
Reference Point Dosage Given to Date: 25 Gy
Reference Point Dosage Given to Date: 30 Gy
Reference Point Session Dosage Given: 10 Gy
Reference Point Session Dosage Given: 5 Gy
Session Number: 5

## 2024-09-02 ENCOUNTER — Ambulatory Visit
Admission: RE | Admit: 2024-09-02 | Discharge: 2024-09-02 | Disposition: A | Discharge status: Home / Self Care | Source: Ambulatory Visit | Attending: Radiation Oncology | Admitting: Radiation Oncology

## 2024-09-02 ENCOUNTER — Other Ambulatory Visit: Payer: Self-pay

## 2024-09-02 DIAGNOSIS — C775 Secondary and unspecified malignant neoplasm of intrapelvic lymph nodes: Secondary | ICD-10-CM | POA: Diagnosis not present

## 2024-09-02 DIAGNOSIS — C61 Malignant neoplasm of prostate: Secondary | ICD-10-CM | POA: Diagnosis not present

## 2024-09-02 DIAGNOSIS — C7951 Secondary malignant neoplasm of bone: Secondary | ICD-10-CM | POA: Diagnosis not present

## 2024-09-02 DIAGNOSIS — Z510 Encounter for antineoplastic radiation therapy: Secondary | ICD-10-CM | POA: Diagnosis not present

## 2024-09-02 LAB — RAD ONC ARIA SESSION SUMMARY
Course Elapsed Days: 7
Plan Fractions Treated to Date: 6
Plan Prescribed Dose Per Fraction: 5 Gy
Plan Total Fractions Prescribed: 10
Plan Total Prescribed Dose: 50 Gy
Reference Point Dosage Given to Date: 30 Gy
Reference Point Session Dosage Given: 5 Gy
Session Number: 6

## 2024-09-03 ENCOUNTER — Other Ambulatory Visit: Payer: Self-pay

## 2024-09-03 ENCOUNTER — Ambulatory Visit
Admission: RE | Admit: 2024-09-03 | Discharge: 2024-09-03 | Disposition: A | Discharge status: Home / Self Care | Source: Ambulatory Visit | Attending: Radiation Oncology | Admitting: Radiation Oncology

## 2024-09-03 DIAGNOSIS — C775 Secondary and unspecified malignant neoplasm of intrapelvic lymph nodes: Secondary | ICD-10-CM | POA: Diagnosis not present

## 2024-09-03 DIAGNOSIS — Z510 Encounter for antineoplastic radiation therapy: Secondary | ICD-10-CM | POA: Diagnosis not present

## 2024-09-03 DIAGNOSIS — C7951 Secondary malignant neoplasm of bone: Secondary | ICD-10-CM | POA: Diagnosis not present

## 2024-09-03 DIAGNOSIS — C61 Malignant neoplasm of prostate: Secondary | ICD-10-CM | POA: Diagnosis not present

## 2024-09-03 LAB — RAD ONC ARIA SESSION SUMMARY
Course Elapsed Days: 8
Plan Fractions Treated to Date: 4
Plan Fractions Treated to Date: 7
Plan Prescribed Dose Per Fraction: 10 Gy
Plan Prescribed Dose Per Fraction: 5 Gy
Plan Total Fractions Prescribed: 10
Plan Total Fractions Prescribed: 5
Plan Total Prescribed Dose: 50 Gy
Plan Total Prescribed Dose: 50 Gy
Reference Point Dosage Given to Date: 35 Gy
Reference Point Dosage Given to Date: 40 Gy
Reference Point Session Dosage Given: 10 Gy
Reference Point Session Dosage Given: 5 Gy
Session Number: 7

## 2024-09-04 ENCOUNTER — Ambulatory Visit
Admission: RE | Admit: 2024-09-04 | Discharge: 2024-09-04 | Disposition: A | Source: Ambulatory Visit | Attending: Radiation Oncology

## 2024-09-04 ENCOUNTER — Other Ambulatory Visit: Payer: Self-pay

## 2024-09-04 ENCOUNTER — Ambulatory Visit
Admission: RE | Admit: 2024-09-04 | Discharge: 2024-09-04 | Disposition: A | Discharge status: Home / Self Care | Source: Ambulatory Visit | Attending: Radiation Oncology | Admitting: Radiation Oncology

## 2024-09-04 DIAGNOSIS — C7951 Secondary malignant neoplasm of bone: Secondary | ICD-10-CM | POA: Diagnosis not present

## 2024-09-04 DIAGNOSIS — C61 Malignant neoplasm of prostate: Secondary | ICD-10-CM | POA: Diagnosis not present

## 2024-09-04 DIAGNOSIS — Z510 Encounter for antineoplastic radiation therapy: Secondary | ICD-10-CM | POA: Diagnosis not present

## 2024-09-04 DIAGNOSIS — C775 Secondary and unspecified malignant neoplasm of intrapelvic lymph nodes: Secondary | ICD-10-CM | POA: Diagnosis not present

## 2024-09-04 LAB — RAD ONC ARIA SESSION SUMMARY
Course Elapsed Days: 9
Plan Fractions Treated to Date: 8
Plan Prescribed Dose Per Fraction: 5 Gy
Plan Total Fractions Prescribed: 10
Plan Total Prescribed Dose: 50 Gy
Reference Point Dosage Given to Date: 40 Gy
Reference Point Session Dosage Given: 5 Gy
Session Number: 8

## 2024-09-05 ENCOUNTER — Other Ambulatory Visit: Payer: Self-pay

## 2024-09-05 ENCOUNTER — Ambulatory Visit
Admission: RE | Admit: 2024-09-05 | Discharge: 2024-09-05 | Disposition: A | Discharge status: Home / Self Care | Source: Ambulatory Visit | Attending: Radiation Oncology | Admitting: Radiation Oncology

## 2024-09-05 ENCOUNTER — Ambulatory Visit

## 2024-09-05 DIAGNOSIS — C61 Malignant neoplasm of prostate: Secondary | ICD-10-CM | POA: Diagnosis not present

## 2024-09-05 DIAGNOSIS — Z510 Encounter for antineoplastic radiation therapy: Secondary | ICD-10-CM | POA: Diagnosis not present

## 2024-09-05 DIAGNOSIS — C775 Secondary and unspecified malignant neoplasm of intrapelvic lymph nodes: Secondary | ICD-10-CM | POA: Diagnosis not present

## 2024-09-05 DIAGNOSIS — C7951 Secondary malignant neoplasm of bone: Secondary | ICD-10-CM | POA: Diagnosis not present

## 2024-09-05 LAB — RAD ONC ARIA SESSION SUMMARY
Course Elapsed Days: 10
Plan Fractions Treated to Date: 5
Plan Fractions Treated to Date: 9
Plan Prescribed Dose Per Fraction: 10 Gy
Plan Prescribed Dose Per Fraction: 5 Gy
Plan Total Fractions Prescribed: 10
Plan Total Fractions Prescribed: 5
Plan Total Prescribed Dose: 50 Gy
Plan Total Prescribed Dose: 50 Gy
Reference Point Dosage Given to Date: 45 Gy
Reference Point Dosage Given to Date: 50 Gy
Reference Point Session Dosage Given: 10 Gy
Reference Point Session Dosage Given: 5 Gy
Session Number: 9

## 2024-09-08 ENCOUNTER — Other Ambulatory Visit: Payer: Self-pay

## 2024-09-08 ENCOUNTER — Ambulatory Visit
Admission: RE | Admit: 2024-09-08 | Discharge: 2024-09-08 | Disposition: A | Discharge status: Home / Self Care | Source: Ambulatory Visit | Attending: Radiation Oncology | Admitting: Radiation Oncology

## 2024-09-08 DIAGNOSIS — Z510 Encounter for antineoplastic radiation therapy: Secondary | ICD-10-CM | POA: Diagnosis not present

## 2024-09-08 DIAGNOSIS — C61 Malignant neoplasm of prostate: Secondary | ICD-10-CM | POA: Diagnosis not present

## 2024-09-08 DIAGNOSIS — C775 Secondary and unspecified malignant neoplasm of intrapelvic lymph nodes: Secondary | ICD-10-CM | POA: Diagnosis not present

## 2024-09-08 DIAGNOSIS — C7951 Secondary malignant neoplasm of bone: Secondary | ICD-10-CM | POA: Diagnosis not present

## 2024-09-08 LAB — RAD ONC ARIA SESSION SUMMARY
Course Elapsed Days: 13
Plan Fractions Treated to Date: 10
Plan Prescribed Dose Per Fraction: 5 Gy
Plan Total Fractions Prescribed: 10
Plan Total Prescribed Dose: 50 Gy
Reference Point Dosage Given to Date: 50 Gy
Reference Point Session Dosage Given: 5 Gy
Session Number: 10

## 2024-09-09 NOTE — Radiation Completion Notes (Signed)
 Patient Name: Curtis Marquez, Curtis Marquez MRN: [id number] Date of Birth: Nov 08, 1945 Referring Physician: NORETTA FERRARA, M.D. Date of Service: 2024-09-09 Radiation Oncologist: Adina Barge, M.D. [facility]                             RADIATION ONCOLOGY END OF TREATMENT NOTE     Diagnosis: C61 Malignant neoplasm of prostate Staging on 2015-05-18: Prostate cancer (HCC) T=T2c, N=N0, M=M0 Staging on 2013-11-13: Prostate cancer (HCC) T=pT2, N=pN0, M=cM0 Intent: Curative     ==========DELIVERED PLANS==========  First Treatment Date: 2024-08-26 Last Treatment Date: 2024-09-08   Plan Name: Pelvis_UHRT Site: Pelvis Technique: IMRT Mode: Photon Dose Per Fraction: 5 Gy Prescribed Dose (Delivered / Prescribed): 50 Gy / 50 Gy Prescribed Fxs (Delivered / Prescribed): 10 / 10   Plan Name: Chest_R_SBRT Site: Ribs, Right Technique: SBRT/SRT-IMRT Mode: Photon Dose Per Fraction: 10 Gy Prescribed Dose (Delivered / Prescribed): 50 Gy / 50 Gy Prescribed Fxs (Delivered / Prescribed): 5 / 5     ==========ON TREATMENT VISIT DATES========== 2024-08-26, 2024-08-28, 2024-08-29, 2024-09-01, 2024-09-03, 2024-09-04, 2024-09-05     ==========UPCOMING VISITS==========       ==========APPENDIX - ON TREATMENT VISIT NOTES==========   See weekly On Treatment Notes in Epic for details in the Media tab (listed as Progress notes on the On Treatment Visit Dates listed above).

## 2024-09-17 DIAGNOSIS — C61 Malignant neoplasm of prostate: Secondary | ICD-10-CM | POA: Diagnosis not present

## 2024-09-17 DIAGNOSIS — Z299 Encounter for prophylactic measures, unspecified: Secondary | ICD-10-CM | POA: Diagnosis not present

## 2024-09-17 DIAGNOSIS — R600 Localized edema: Secondary | ICD-10-CM | POA: Diagnosis not present

## 2024-09-17 DIAGNOSIS — I10 Essential (primary) hypertension: Secondary | ICD-10-CM | POA: Diagnosis not present

## 2024-09-17 DIAGNOSIS — N182 Chronic kidney disease, stage 2 (mild): Secondary | ICD-10-CM | POA: Diagnosis not present

## 2024-11-19 ENCOUNTER — Emergency Department (HOSPITAL_COMMUNITY)

## 2024-11-19 ENCOUNTER — Other Ambulatory Visit: Payer: Self-pay

## 2024-11-19 ENCOUNTER — Encounter (HOSPITAL_COMMUNITY): Payer: Self-pay

## 2024-11-19 ENCOUNTER — Inpatient Hospital Stay (HOSPITAL_COMMUNITY)
Admission: EM | Admit: 2024-11-19 | Discharge: 2024-11-22 | DRG: 292 | Disposition: A | Attending: Internal Medicine | Admitting: Internal Medicine

## 2024-11-19 DIAGNOSIS — Z823 Family history of stroke: Secondary | ICD-10-CM

## 2024-11-19 DIAGNOSIS — Z87891 Personal history of nicotine dependence: Secondary | ICD-10-CM

## 2024-11-19 DIAGNOSIS — I358 Other nonrheumatic aortic valve disorders: Secondary | ICD-10-CM | POA: Diagnosis present

## 2024-11-19 DIAGNOSIS — H409 Unspecified glaucoma: Secondary | ICD-10-CM | POA: Diagnosis present

## 2024-11-19 DIAGNOSIS — E7800 Pure hypercholesterolemia, unspecified: Secondary | ICD-10-CM | POA: Diagnosis present

## 2024-11-19 DIAGNOSIS — R188 Other ascites: Secondary | ICD-10-CM | POA: Diagnosis present

## 2024-11-19 DIAGNOSIS — J9811 Atelectasis: Secondary | ICD-10-CM | POA: Diagnosis present

## 2024-11-19 DIAGNOSIS — I5033 Acute on chronic diastolic (congestive) heart failure: Secondary | ICD-10-CM | POA: Diagnosis not present

## 2024-11-19 DIAGNOSIS — R600 Localized edema: Secondary | ICD-10-CM | POA: Insufficient documentation

## 2024-11-19 DIAGNOSIS — Z923 Personal history of irradiation: Secondary | ICD-10-CM

## 2024-11-19 DIAGNOSIS — R531 Weakness: Secondary | ICD-10-CM

## 2024-11-19 DIAGNOSIS — Z833 Family history of diabetes mellitus: Secondary | ICD-10-CM

## 2024-11-19 DIAGNOSIS — K219 Gastro-esophageal reflux disease without esophagitis: Secondary | ICD-10-CM | POA: Diagnosis present

## 2024-11-19 DIAGNOSIS — Z8546 Personal history of malignant neoplasm of prostate: Secondary | ICD-10-CM

## 2024-11-19 DIAGNOSIS — Z9079 Acquired absence of other genital organ(s): Secondary | ICD-10-CM

## 2024-11-19 DIAGNOSIS — N1831 Chronic kidney disease, stage 3a: Secondary | ICD-10-CM | POA: Diagnosis present

## 2024-11-19 DIAGNOSIS — I130 Hypertensive heart and chronic kidney disease with heart failure and stage 1 through stage 4 chronic kidney disease, or unspecified chronic kidney disease: Principal | ICD-10-CM | POA: Diagnosis present

## 2024-11-19 DIAGNOSIS — Z8249 Family history of ischemic heart disease and other diseases of the circulatory system: Secondary | ICD-10-CM

## 2024-11-19 DIAGNOSIS — E8809 Other disorders of plasma-protein metabolism, not elsewhere classified: Secondary | ICD-10-CM | POA: Diagnosis present

## 2024-11-19 DIAGNOSIS — Z1152 Encounter for screening for COVID-19: Secondary | ICD-10-CM

## 2024-11-19 DIAGNOSIS — N179 Acute kidney failure, unspecified: Principal | ICD-10-CM | POA: Diagnosis present

## 2024-11-19 DIAGNOSIS — I10 Essential (primary) hypertension: Secondary | ICD-10-CM | POA: Diagnosis present

## 2024-11-19 DIAGNOSIS — C61 Malignant neoplasm of prostate: Secondary | ICD-10-CM | POA: Diagnosis present

## 2024-11-19 DIAGNOSIS — Z79899 Other long term (current) drug therapy: Secondary | ICD-10-CM

## 2024-11-19 DIAGNOSIS — I160 Hypertensive urgency: Secondary | ICD-10-CM | POA: Diagnosis present

## 2024-11-19 HISTORY — DX: Noninfective gastroenteritis and colitis, unspecified: K52.9

## 2024-11-19 HISTORY — DX: Unspecified glaucoma: H40.9

## 2024-11-19 LAB — RESP PANEL BY RT-PCR (RSV, FLU A&B, COVID)  RVPGX2
Influenza A by PCR: NEGATIVE
Influenza B by PCR: NEGATIVE
Resp Syncytial Virus by PCR: NEGATIVE
SARS Coronavirus 2 by RT PCR: NEGATIVE

## 2024-11-19 LAB — COMPREHENSIVE METABOLIC PANEL WITH GFR
ALT: 11 U/L (ref 0–44)
AST: 18 U/L (ref 15–41)
Albumin: 2.8 g/dL — ABNORMAL LOW (ref 3.5–5.0)
Alkaline Phosphatase: 132 U/L — ABNORMAL HIGH (ref 38–126)
Anion gap: 14 (ref 5–15)
BUN: 26 mg/dL — ABNORMAL HIGH (ref 8–23)
CO2: 21 mmol/L — ABNORMAL LOW (ref 22–32)
Calcium: 8.4 mg/dL — ABNORMAL LOW (ref 8.9–10.3)
Chloride: 108 mmol/L (ref 98–111)
Creatinine, Ser: 1.68 mg/dL — ABNORMAL HIGH (ref 0.61–1.24)
GFR, Estimated: 42 mL/min — ABNORMAL LOW
Glucose, Bld: 123 mg/dL — ABNORMAL HIGH (ref 70–99)
Potassium: 3.8 mmol/L (ref 3.5–5.1)
Sodium: 144 mmol/L (ref 135–145)
Total Bilirubin: 0.4 mg/dL (ref 0.0–1.2)
Total Protein: 6.1 g/dL — ABNORMAL LOW (ref 6.5–8.1)

## 2024-11-19 LAB — TROPONIN T, HIGH SENSITIVITY
Troponin T High Sensitivity: 63 ng/L — ABNORMAL HIGH (ref 0–19)
Troponin T High Sensitivity: 62 ng/L — ABNORMAL HIGH (ref 0–19)

## 2024-11-19 LAB — CBC
HCT: 40.6 % (ref 39.0–52.0)
Hemoglobin: 13 g/dL (ref 13.0–17.0)
MCH: 29.1 pg (ref 26.0–34.0)
MCHC: 32 g/dL (ref 30.0–36.0)
MCV: 91 fL (ref 80.0–100.0)
Platelets: 436 10*3/uL — ABNORMAL HIGH (ref 150–400)
RBC: 4.46 MIL/uL (ref 4.22–5.81)
RDW: 15.3 % (ref 11.5–15.5)
WBC: 11.5 10*3/uL — ABNORMAL HIGH (ref 4.0–10.5)
nRBC: 0 % (ref 0.0–0.2)

## 2024-11-19 LAB — PRO BRAIN NATRIURETIC PEPTIDE: Pro Brain Natriuretic Peptide: 5440 pg/mL — ABNORMAL HIGH

## 2024-11-19 MED ORDER — PROCHLORPERAZINE EDISYLATE 10 MG/2ML IJ SOLN: 5.0000 mg | Freq: Four times a day (QID) | INTRAMUSCULAR | Status: DC | PRN

## 2024-11-19 MED ORDER — POLYETHYLENE GLYCOL 3350 17 G PO PACK: 17.0000 g | PACK | Freq: Every day | ORAL | Status: DC | PRN

## 2024-11-19 MED ORDER — AMLODIPINE BESYLATE 5 MG PO TABS
5.0000 mg | ORAL_TABLET | Freq: Every day | ORAL | Status: DC
  Administered 2024-11-19: 5 mg via ORAL
  Filled 2024-11-19: qty 1

## 2024-11-19 MED ORDER — ENOXAPARIN SODIUM 40 MG/0.4ML IJ SOSY
40.0000 mg | PREFILLED_SYRINGE | INTRAMUSCULAR | Status: DC
  Administered 2024-11-20 – 2024-11-22 (×3): 40 mg via SUBCUTANEOUS
  Filled 2024-11-19 (×4): qty 0.4

## 2024-11-19 MED ORDER — FUROSEMIDE 10 MG/ML IJ SOLN
40.0000 mg | Freq: Once | INTRAMUSCULAR | Status: AC
  Administered 2024-11-19: 40 mg via INTRAVENOUS
  Filled 2024-11-19: qty 4

## 2024-11-19 MED ORDER — FUROSEMIDE 10 MG/ML IJ SOLN
40.0000 mg | Freq: Two times a day (BID) | INTRAMUSCULAR | Status: AC
  Administered 2024-11-19 – 2024-11-21 (×4): 40 mg via INTRAVENOUS
  Filled 2024-11-19 (×4): qty 4

## 2024-11-19 MED ORDER — ACETAMINOPHEN 325 MG PO TABS
650.0000 mg | ORAL_TABLET | Freq: Four times a day (QID) | ORAL | Status: DC | PRN
  Administered 2024-11-21: 650 mg via ORAL
  Filled 2024-11-19: qty 2

## 2024-11-19 MED ORDER — IOHEXOL 300 MG/ML  SOLN
80.0000 mL | Freq: Once | INTRAMUSCULAR | Status: AC | PRN
  Administered 2024-11-19: 80 mL via INTRAVENOUS

## 2024-11-19 MED ORDER — MELATONIN 3 MG PO TABS: 6.0000 mg | ORAL_TABLET | Freq: Every evening | ORAL | Status: DC | PRN

## 2024-11-19 MED ORDER — LABETALOL HCL 5 MG/ML IV SOLN
20.0000 mg | Freq: Once | INTRAVENOUS | Status: AC
  Administered 2024-11-19: 20 mg via INTRAVENOUS
  Filled 2024-11-19: qty 1

## 2024-11-19 NOTE — ED Provider Notes (Signed)
" ° °  Patient signed out to me by Mliss Narrow, PA-C pending CT abdomen and reevaluation   Patient here with complaint of cough and shortness of breath that is mostly exertional.  Also describes some peripheral edema and lower abdominal discomfort.  Symptoms have been persistent for 2 to 3 days.  No fever or chills.  Denies any chest pain  He takes hydrochlorothiazide  and furosemide  daily.  Denies any missed doses.  Also takes antihypertensive medications but states he has not taken his medication today  See previous provider note for complete H&P  On my exam, patient has clear lung sounds to auscultation.  There is pitting edema of the bilateral lower extremities and he has mildly distended abdomen w/o significant tenderness.  The CT of his abdomen shows large bilateral pleural effusions.  No evidence of bowel obstruction, there is ascites including extension into the left inguinal canal.  He does have an elevated proBNP at greater than 5000.  His troponins are elevated but flat his kidney functions are elevated but there is no recent value for comparison.  He is significantly hypertensive on exam.  He was given labetalol  by previous provider.  I suspect symptoms are related to CHF.  On review his medical records, I his last echocardiogram was in 2017.  He had EF of 65 to 70% at that time.  He will likely need repeat echocardiogram and he is agreeable to admission.  Will start diuresis.  I spoken with [facility] hospitalist, Dr. Shona who has agreed to admit   Herlinda Milling, PA-C 11/19/24 [id number]  "

## 2024-11-19 NOTE — ED Provider Notes (Signed)
 " [facility] AT [facility] Provider Note   CSN: [id number] Arrival date & time: 11/19/24  1220     Patient presents with: Shortness of Breath   Curtis Marquez is a 79 y.o. male with a history including hypertension, hypercholesterolemia, GERD, history of metastatic prostate cancer, presenting with multiple complaints, first describes lower abdominal pain described as cramping, aching and constant, it is not accompanied by nausea vomiting or diarrhea, but does describe some bloating in his lower abdomen.  He has been stooling and having passage of flatus.  No urinary complaints.  He does report poor appetite since his symptoms began as and has also had increased cough and shortness of breath with exertion, better at rest.  He denies chest pain, palpitations, also denies fevers or chills. Cough has been nonproductive.   The history is provided by the patient.       Prior to Admission medications  Medication Sig Start Date End Date Taking? Authorizing Provider  acetaminophen  (TYLENOL ) 500 MG tablet Take 2 tablets (1,000 mg total) by mouth every 6 (six) hours. 02/21/23   Pappayliou, Dorothyann A, DO  furosemide  (LASIX ) 20 MG tablet Take 20 mg by mouth daily. 06/21/24   [provider]  hydrochlorothiazide  (HYDRODIURIL ) 25 MG tablet Take 25 mg by mouth daily. 06/01/24   [provider]  ibuprofen  (ADVIL ) 800 MG tablet Take 1 tablet (800 mg total) by mouth every 8 (eight) hours as needed for moderate pain (pain score 4-6). 02/09/24   Suzette Pac, MD  losartan  (COZAAR ) 50 MG tablet Take 50 mg by mouth daily. 05/17/24   [provider]  senna-docusate (SENOKOT-S) 8.6-50 MG tablet Take 2 tablets by mouth at bedtime. Patient taking differently: Take 2 tablets by mouth as needed for mild constipation or moderate constipation. 02/21/23   Pearlean Manus, MD    Allergies: Patient has no known allergies.    Review of Systems  Constitutional:   Negative for chills and fever.  HENT:  Negative for congestion and sore throat.   Eyes: Negative.   Respiratory:  Positive for cough and shortness of breath. Negative for chest tightness.   Cardiovascular:  Negative for chest pain and palpitations.  Gastrointestinal:  Negative for abdominal pain and nausea.  Genitourinary: Negative.   Musculoskeletal:  Negative for arthralgias, joint swelling and neck pain.  Skin: Negative.  Negative for rash and wound.  Neurological:  Negative for dizziness, weakness, light-headedness, numbness and headaches.  Psychiatric/Behavioral: Negative.      Updated Vital Signs BP (!) 179/104   Pulse 87   Temp 97.6 F (36.4 C)   Resp 19   Ht 5' 8 (1.727 m)   Wt 87.1 kg   SpO2 99%   BMI 29.19 kg/m   Physical Exam  (all labs ordered are listed, but only abnormal results are displayed) Labs Reviewed  CBC - Abnormal; Notable for the following components:      Result Value   WBC 11.5 (*)    Platelets 436 (*)    All other components within normal limits  COMPREHENSIVE METABOLIC PANEL WITH GFR - Abnormal; Notable for the following components:   CO2 21 (*)    Glucose, Bld 123 (*)    BUN 26 (*)    Creatinine, Ser 1.68 (*)    Calcium 8.4 (*)    Total Protein 6.1 (*)    Albumin 2.8 (*)    Alkaline Phosphatase 132 (*)    GFR, Estimated 42 (*)  All other components within normal limits  PRO BRAIN NATRIURETIC PEPTIDE - Abnormal; Notable for the following components:   Pro Brain Natriuretic Peptide 5,440.0 (*)    All other components within normal limits  TROPONIN T, HIGH SENSITIVITY - Abnormal; Notable for the following components:   Troponin T High Sensitivity 63 (*)    All other components within normal limits  RESP PANEL BY RT-PCR (RSV, FLU A&B, COVID)  [id number]  TROPONIN T, HIGH SENSITIVITY    EKG: EKG Interpretation Date/Time:  Wednesday November 19 2024 15:46:21 EST Ventricular Rate:  106 PR Interval:  164 QRS Duration:  101 QT  Interval:  388 QTC Calculation: 498 R Axis:   80  Text Interpretation: Sinus tachycardia Multiform ventricular premature complexes Borderline low voltage, extremity leads Borderline ST elevation, anterior leads Borderline prolonged QT interval Baseline wander in lead(s) V3 increased rate from prior 4/24 Confirmed by Towana Sharper [phone]) on 11/19/2024 3:53:34 PM  Radiology: [facility] Chest Port 1 View Result Date: 11/19/2024 EXAM: 1 VIEW(S) XRAY OF THE CHEST 11/19/2024 05:41:00 PM COMPARISON: 02/24/2021. CLINICAL HISTORY: Shortness of breath. FINDINGS: LUNGS AND PLEURA: Low lung volumes. Small bilateral pleural effusions. Bibasilar consolidations and atelectasis. Right mid lung opacity. No pneumothorax. HEART AND MEDIASTINUM: No acute abnormality of the cardiac and mediastinal silhouettes. BONES AND SOFT TISSUES: No acute osseous abnormality. IMPRESSION: 1. Bibasilar consolidations and atelectasis. 2. Small bilateral pleural effusions. 3. Right mid lung opacity, which may represent additional consolidation. Electronically signed by: Oneil Devonshire MD 11/19/2024 05:50 PM EST RP Workstation: [id number]     Procedures   Medications Ordered in the ED  labetalol  (NORMODYNE ) injection 20 mg (20 mg Intravenous Given 11/19/24 1833)  iohexol  (OMNIPAQUE ) 300 MG/ML solution 80 mL (80 mLs Intravenous Contrast Given 11/19/24 1915)                                    Medical Decision Making Patient presenting with multiple complaints including cough and increased shortness of breath along with abdominal pain and mild abdominal distention without nausea vomiting or diarrhea/constipation, has been afebrile, has had poor p.o. intake since his symptoms started on Monday, 3 days ago.  Differential diagnosis would include pneumonia, viral URI, bronchitis, RSV, influenza, COVID.  Also abdominal pain would include a broad differential, based on his exam concerns for small bowel obstruction given distention and increased  tympany, constipation, complication of his known metastatic prostate cancer, diverticulitis, colitis.  Pending CT abdomen pelvis at this time.  Given lab test that of currently resulted, patient will probably need admission for acute kidney injury, he also appears to be fluid overloaded, possibly secondary to hypertensive urgency.  Tammy Triplett, PA-C assuming care and will get patient admitted once CT imaging is completed.    Amount and/or Complexity of Data Reviewed Labs: ordered.    Details: Respiratory panel is negative, he does have a significant elevated BNP at 5440 suggesting possible new onset CHF, his initial troponin is elevated at 63, pending delta troponin at this time, of note he denies chest pain.  CMET is significant for BUN of 26 and a creatinine of 1.68, his alk phos is elevated 132, WBC count is elevated at 11.5. Radiology: ordered.    Details: Chest x-ray including small bilateral pleural effusions right midlung opacity.  Risk Prescription drug management.        Final diagnoses:  AKI (acute kidney injury)  Hypertensive urgency    ED  Discharge Orders     None          Birdena Clarity, DEVONNA 11/19/24 1945  "

## 2024-11-19 NOTE — ED Notes (Signed)
 The patient reports he has been experiencing shortness of breath since Monday, reports it is worse when he walks. He reports he also had one episode of vomiting today associated with abdominal pain. He denies having flu like symptoms. He denies having a cough, denies nasal congestion, denies fever/chills.

## 2024-11-19 NOTE — ED Triage Notes (Signed)
 Pt reports abd pain, cough, shob and no appetite since Monday.

## 2024-11-19 NOTE — H&P (Signed)
 " History and Physical  Curtis Marquez FMW:[id number] DOB: 1946-10-03 DOA: 11/19/2024  Referring physician: Herlinda BROTHERS  PCP: Maree Isles, MD  Outpatient Specialists: Medical oncology. Patient coming from: Home.  Chief Complaint: Shortness of breath, cough.  HPI: Curtis Marquez is a 79 y.o. male with medical history significant for prostate cancer s/p radiation in December 2025, hypertension, who presents to the ER due to shortness of breath and nonproductive cough.  Associated with bilateral lower extremity edema.  States he noted the edema 2 weeks ago.  Admits to compliance with his home HCTZ and Lasix .  Denies having any fevers or chills.  No chest pain.  Endorses very low appetite in the past 2 days.  In the ER, significantly hypertensive despite diuresing.  Chest x-ray showing bibasilar consolidation and atelectasis, small bilateral pleural effusions.  Mild midlung opacity which may represent additional consolidation.  Volume overload on exam, proBNP greater than 5000.  Received IV Lasix  40 mg x 1 in the ER.  TRH, hospitalist service, was asked to admit for further management of acute on chronic HFpEF.  ED Course: Temperature 98.1.  BP 167/95, pulse 96, respiratory rate 19, O2 saturation 98% on room air.  Review of Systems: Review of systems as noted in the HPI. All other systems reviewed and are negative.   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   ESOPHAGOGASTRODUODENOSCOPY N/A 03/24/2019    one benign-appearing, intrinsic moderate stenosis was fond s/p dilation. Mild gastritis.   INGUINAL HERNIA REPAIR Right 02/20/2023   Procedure: HERNIA REPAIR INGUINAL INCARCERATED WITH MESH;  Surgeon: Evonnie Dorothyann LABOR, DO;  Location: [facility];  Service: General;  Laterality: Right;   LYMPHADENECTOMY Bilateral 11/13/2013   Procedure: LYMPHADENECTOMY PELVIC LYMPH NODE DISSECTION;  Surgeon: Noretta Ferrara, MD;  Location: [facility];  Service: Urology;  Laterality: Bilateral;   ROBOT ASSISTED LAPAROSCOPIC RADICAL PROSTATECTOMY N/A 11/13/2013   Procedure: ROBOTIC ASSISTED LAPAROSCOPIC RADICAL PROSTATECTOMY LEVEL 2;  Surgeon: Noretta Ferrara, MD;  Location: [facility];  Service: Urology;  Laterality: N/A;   SAVORY DILATION N/A 03/24/2019   Procedure: SAVORY DILATION;  Surgeon: Harvey Margo CROME, MD;  Location: [facility];  Service: Endoscopy;  Laterality: N/A;    Social History:  reports that he has quit smoking. He has never used smokeless tobacco. He reports that he does not drink alcohol  and does not use drugs.   Allergies[1]  Family History  Problem Relation Age of Onset   Diabetes Mother    Hypertension Mother    CVA Father    Diabetes Sister    Hypertension Sister    Cancer Brother    Colon cancer Neg Hx    Colon polyps Neg Hx       Prior to Admission medications  Medication Sig Start Date End Date Taking? Authorizing Provider  acetaminophen  (TYLENOL ) 500 MG tablet Take 2 tablets (1,000 mg total) by mouth every 6 (six) hours. 02/21/23   Pappayliou, Dorothyann A, DO  furosemide  (LASIX ) 20 MG tablet Take 20 mg by mouth daily.  06/21/24   [provider]  hydrochlorothiazide  (HYDRODIURIL ) 25 MG tablet Take 25 mg by mouth daily. 06/01/24   [provider]  ibuprofen  (ADVIL ) 800 MG tablet Take 1 tablet (800 mg total) by mouth every 8 (eight) hours as needed for moderate pain (pain score 4-6). 02/09/24   Suzette Pac, MD  losartan  (COZAAR ) 50 MG tablet Take 50 mg by mouth daily.  05/17/24   [provider]  senna-docusate (SENOKOT-S) 8.6-50 MG tablet Take 2 tablets by mouth at bedtime. Patient taking differently: Take 2 tablets by mouth as needed for mild constipation or moderate constipation. 02/21/23   Pearlean Manus, MD    Physical Exam: BP (!) 215/135   Pulse 91   Temp 97.6 F (36.4 C)   Resp 18   Ht 5' 8 (1.727 m)   Wt 87.1 kg   SpO2 96%   BMI 29.19 kg/m   General: 79 y.o. year-old male weak appearing in no acute distress.  Alert and oriented x3. Cardiovascular: Regular rate and rhythm with no rubs or gallops.  No thyromegaly or JVD noted.  2+ pitting edema in lower extremities bilaterally. Respiratory: Faint rales at bases.  Poor inspiratory effort. Abdomen: Soft nontender nondistended with normal bowel sounds x4 quadrants. Muskuloskeletal: No cyanosis or clubbing noted bilaterally Neuro: CN II-XII intact, strength, sensation, reflexes Skin: No ulcerative lesions noted or rashes Psychiatry: Judgement and insight appear normal. Mood is appropriate for condition and setting          Labs on Admission:  Basic Metabolic Panel: Recent Labs  Lab 11/19/24 1719  NA 144  K 3.8  CL 108  CO2 21*  GLUCOSE 123*  BUN 26*  CREATININE 1.68*  CALCIUM 8.4*   Liver Function Tests: Recent Labs  Lab 11/19/24 1719  AST 18  ALT 11  ALKPHOS 132*  BILITOT 0.4  PROT 6.1*  ALBUMIN 2.8*   No results for input(s): LIPASE, AMYLASE in the last 168 hours. No results for input(s): AMMONIA in the last 168 hours. CBC: Recent Labs  Lab 11/19/24 1719  WBC 11.5*  HGB 13.0  HCT 40.6  MCV 91.0  PLT 436*   Cardiac Enzymes: No results for input(s): CKTOTAL, CKMB, CKMBINDEX, TROPONINI in the last 168 hours.  BNP (last 3 results) No results for input(s): BNP in the last 8760 hours.  ProBNP (last 3 results) Recent Labs    11/19/24 1719  PROBNP 5,440.0*    CBG: No results for input(s): GLUCAP in the last 168  hours.  Radiological Exams on Admission: CT ABDOMEN PELVIS W CONTRAST Result Date: 11/19/2024 EXAM: CT ABDOMEN AND PELVIS WITH CONTRAST 11/19/2024 07:27:43 PM TECHNIQUE: CT of the abdomen and pelvis was performed with the administration of 80 mL iohexol  (OMNIPAQUE ) 300 mg/mL solution. Multiplanar reformatted images are provided for review. Automated exposure control, iterative reconstruction, and/or weight-based adjustment of the mA/kV was utilized to reduce the radiation dose to as low as reasonably achievable. COMPARISON: Chest x-ray from 11/19/2024. CLINICAL HISTORY: Shortness of breath for several days. FINDINGS: LOWER CHEST: Large bilateral pleural effusions are noted. Bibasilar consolidation is noted slightly worse on the right than the left. LIVER: Perihepatic fluid is noted. GALLBLADDER AND BILE DUCTS: Gallbladder is unremarkable. No biliary ductal dilatation. SPLEEN: Perisplenic fluid is noted. PANCREAS: No acute abnormality. ADRENAL GLANDS: No acute abnormality. KIDNEYS, URETERS AND BLADDER: The kidneys show no renal calculi or obstructive changes. Cystic changes are noted within the right kidney. No follow-up is recommended. The bladder is decompressed. No perinephric  or periureteral stranding. GI AND BOWEL: The stomach is unremarkable. Multiple edematous loops of small bowel are identified within in the ileum. The colon is predominantly decompressed. The appendix is within normal limits. No obstructive changes are seen. PERITONEUM AND RETROPERITONEUM: Ascites is noted extending into the left inguinal canal. No bowel is noted within. No free air. VASCULATURE: Aorta is normal in caliber. LYMPH NODES: No lymphadenopathy. REPRODUCTIVE ORGANS: No acute abnormality. BONES AND SOFT TISSUES: Osseous structures are within normal limits. No focal soft tissue abnormality. IMPRESSION: 1. Large bilateral pleural effusions with bibasilar consolidation, slightly worse on the right. 2. No evidence of bowel  obstruction. 3. Ascites, including extension into the left inguinal canal without bowel involvement. 4. Multiple edematous loops of small bowel, particularly in the ileum, without obstruction. This may represent underlying inflammatory bowel disease. Electronically signed by: Oneil Devonshire MD 11/19/2024 07:38 PM EST RP Workstation: [id number]   DG Chest Port 1 View Result Date: 11/19/2024 EXAM: 1 VIEW(S) XRAY OF THE CHEST 11/19/2024 05:41:00 PM COMPARISON: 02/24/2021. CLINICAL HISTORY: Shortness of breath. FINDINGS: LUNGS AND PLEURA: Low lung volumes. Small bilateral pleural effusions. Bibasilar consolidations and atelectasis. Right mid lung opacity. No pneumothorax. HEART AND MEDIASTINUM: No acute abnormality of the cardiac and mediastinal silhouettes. BONES AND SOFT TISSUES: No acute osseous abnormality. IMPRESSION: 1. Bibasilar consolidations and atelectasis. 2. Small bilateral pleural effusions. 3. Right mid lung opacity, which may represent additional consolidation. Electronically signed by: Oneil Devonshire MD 11/19/2024 05:50 PM EST RP Workstation: [id number]    EKG: I independently viewed the EKG done and my findings are as followed: Sinus tachycardia rate of 106.  QTc 498.  Assessment/Plan Present on Admission:  Acute on chronic diastolic (congestive) heart failure (HCC)  Principal Problem:   Acute on chronic diastolic (congestive) heart failure (HCC)  Acute on chronic HFrEF Last 2D echo done on 02/18/2016 revealed LVEF 65 to 70% 2+ pitting edema lower extremities, proBNP greater than 5000 pleural effusion on chest x-ray Ongoing IV diuresing Monitor strict I's and O's and daily weight Follow transthoracic echocardiogram Consider cardiology consult in the morning  AKI, suspect prerenal At baseline creatinine 1.0 with GFR greater than 60 Presented with creatinine 1.68 with GFR 42 Closely monitor urine output and renal function while diuresing Consider nephrology consultation in the  morning  Prostate cancer s/p radiation Recommend close follow-up with medical oncology or urology outpatient.  Uncontrolled hypertension Norvasc  added 10 mg daily Home losartan  held due to AKI Closely monitor vital signs  Generalized weakness PT OT evaluation Fall precautions.   Critical care time: 55 minutes.   DVT prophylaxis: Subcu Lovenox  daily.  Code Status: Full code.  Family Communication: None at bedside.  Disposition Plan: Admitted to telemetry unit.  Consults called: None.  Admission status: Inpatient status.   Status is: Inpatient The patient requires at least 2 midnights for further evaluation and treatment of present condition.   Terry LOISE Hurst MD [facility] Pager [phone]  If 7PM-7AM, please contact night-coverage [email or web address] Password [facility]  11/19/2024, 11:24 PM      [1] No Known Allergies  "

## 2024-11-19 NOTE — ED Notes (Signed)
 Patient transported to CT

## 2024-11-19 NOTE — ED Notes (Signed)
Julie, PA at bedside.

## 2024-11-20 ENCOUNTER — Inpatient Hospital Stay (HOSPITAL_COMMUNITY)

## 2024-11-20 ENCOUNTER — Encounter (HOSPITAL_COMMUNITY): Payer: Self-pay | Admitting: Internal Medicine

## 2024-11-20 DIAGNOSIS — N179 Acute kidney failure, unspecified: Secondary | ICD-10-CM

## 2024-11-20 DIAGNOSIS — I5033 Acute on chronic diastolic (congestive) heart failure: Secondary | ICD-10-CM | POA: Diagnosis not present

## 2024-11-20 DIAGNOSIS — I10 Essential (primary) hypertension: Secondary | ICD-10-CM | POA: Diagnosis not present

## 2024-11-20 DIAGNOSIS — R188 Other ascites: Secondary | ICD-10-CM | POA: Diagnosis not present

## 2024-11-20 DIAGNOSIS — C61 Malignant neoplasm of prostate: Secondary | ICD-10-CM | POA: Diagnosis not present

## 2024-11-20 DIAGNOSIS — R531 Weakness: Secondary | ICD-10-CM | POA: Diagnosis not present

## 2024-11-20 LAB — ECHOCARDIOGRAM COMPLETE
AR max vel: 3.01 cm2
AV Area VTI: 2.87 cm2
AV Area mean vel: 2.87 cm2
AV Mean grad: 3 mmHg
AV Peak grad: 5.9 mmHg
Ao pk vel: 1.21 m/s
Area-P 1/2: 3.66 cm2
Calc EF: 54.4 %
Height: 68 in
MV VTI: 3.66 cm2
S' Lateral: 2.9 cm
Single Plane A2C EF: 54.5 %
Single Plane A4C EF: 53 %
Weight: 3079.39 [oz_av]

## 2024-11-20 LAB — CBC WITH DIFFERENTIAL/PLATELET
Abs Immature Granulocytes: 0.06 10*3/uL (ref 0.00–0.07)
Basophils Absolute: 0 10*3/uL (ref 0.0–0.1)
Basophils Relative: 0 %
Eosinophils Absolute: 0.1 10*3/uL (ref 0.0–0.5)
Eosinophils Relative: 1 %
HCT: 34.7 % — ABNORMAL LOW (ref 39.0–52.0)
Hemoglobin: 11 g/dL — ABNORMAL LOW (ref 13.0–17.0)
Immature Granulocytes: 1 %
Lymphocytes Relative: 6 %
Lymphs Abs: 0.6 10*3/uL — ABNORMAL LOW (ref 0.7–4.0)
MCH: 28.9 pg (ref 26.0–34.0)
MCHC: 31.7 g/dL (ref 30.0–36.0)
MCV: 91.3 fL (ref 80.0–100.0)
Monocytes Absolute: 0.8 10*3/uL (ref 0.1–1.0)
Monocytes Relative: 8 %
Neutro Abs: 9.1 10*3/uL — ABNORMAL HIGH (ref 1.7–7.7)
Neutrophils Relative %: 84 %
Platelets: 356 10*3/uL (ref 150–400)
RBC: 3.8 MIL/uL — ABNORMAL LOW (ref 4.22–5.81)
RDW: 15.6 % — ABNORMAL HIGH (ref 11.5–15.5)
WBC: 10.7 10*3/uL — ABNORMAL HIGH (ref 4.0–10.5)
nRBC: 0 % (ref 0.0–0.2)

## 2024-11-20 LAB — COMPREHENSIVE METABOLIC PANEL WITH GFR
ALT: 9 U/L (ref 0–44)
AST: 15 U/L (ref 15–41)
Albumin: 2.4 g/dL — ABNORMAL LOW (ref 3.5–5.0)
Alkaline Phosphatase: 101 U/L (ref 38–126)
Anion gap: 14 (ref 5–15)
BUN: 32 mg/dL — ABNORMAL HIGH (ref 8–23)
CO2: 21 mmol/L — ABNORMAL LOW (ref 22–32)
Calcium: 7.8 mg/dL — ABNORMAL LOW (ref 8.9–10.3)
Chloride: 108 mmol/L (ref 98–111)
Creatinine, Ser: 1.88 mg/dL — ABNORMAL HIGH (ref 0.61–1.24)
GFR, Estimated: 37 mL/min — ABNORMAL LOW
Glucose, Bld: 118 mg/dL — ABNORMAL HIGH (ref 70–99)
Potassium: 3.6 mmol/L (ref 3.5–5.1)
Sodium: 143 mmol/L (ref 135–145)
Total Bilirubin: 0.3 mg/dL (ref 0.0–1.2)
Total Protein: 4.9 g/dL — ABNORMAL LOW (ref 6.5–8.1)

## 2024-11-20 LAB — MAGNESIUM: Magnesium: 2.1 mg/dL (ref 1.7–2.4)

## 2024-11-20 LAB — PHOSPHORUS: Phosphorus: 5.3 mg/dL — ABNORMAL HIGH (ref 2.5–4.6)

## 2024-11-20 MED ORDER — FUROSEMIDE 10 MG/ML IJ SOLN: 40.0000 mg | Freq: Once | INTRAMUSCULAR | Status: DC

## 2024-11-20 MED ORDER — HYDRALAZINE HCL 20 MG/ML IJ SOLN
10.0000 mg | Freq: Four times a day (QID) | INTRAMUSCULAR | Status: DC | PRN
  Administered 2024-11-20: 10 mg via INTRAVENOUS
  Filled 2024-11-20: qty 1

## 2024-11-20 MED ORDER — AMLODIPINE BESYLATE 5 MG PO TABS
10.0000 mg | ORAL_TABLET | Freq: Every day | ORAL | Status: DC
  Administered 2024-11-20 – 2024-11-22 (×3): 10 mg via ORAL
  Filled 2024-11-20 (×3): qty 2

## 2024-11-20 MED ORDER — DOXYCYCLINE HYCLATE 100 MG PO TABS
100.0000 mg | ORAL_TABLET | Freq: Two times a day (BID) | ORAL | Status: DC
  Administered 2024-11-20 – 2024-11-22 (×4): 100 mg via ORAL
  Filled 2024-11-20 (×4): qty 1

## 2024-11-20 MED ORDER — ALUM & MAG HYDROXIDE-SIMETH 200-200-20 MG/5ML PO SUSP: 30.0000 mL | Freq: Four times a day (QID) | ORAL | Status: DC | PRN

## 2024-11-20 MED ORDER — SODIUM CHLORIDE 0.9 % IV SOLN
1.0000 g | INTRAVENOUS | Status: DC
  Administered 2024-11-20 – 2024-11-21 (×2): 1 g via INTRAVENOUS
  Filled 2024-11-20 (×2): qty 10

## 2024-11-20 NOTE — Evaluation (Signed)
 Occupational Therapy Evaluation Patient Details Name: Curtis Marquez MRN: [id number] DOB: April 03, 1946 Today's Date: 11/20/2024   History of Present Illness   Curtis Marquez is a 79 y.o. male with medical history significant for prostate cancer s/p radiation in December 2025, hypertension, who presents to the ER due to shortness of breath and nonproductive cough.  Associated with bilateral lower extremity edema.  States he noted the edema 2 weeks ago.  Admits to compliance with his home HCTZ and Lasix .  Denies having any fevers or chills.  No chest pain.  Endorses very low appetite in the past 2 days. (per DO)     Clinical Impressions Pt appears to be at or near baseline function for ADL's and mobility. No physical assist needed throughout OT and PT co-evaluation. Pt able to use the bathroom independently and ambulated in the hall without AD. Pt demonstrated WFL B UE strength and functional use. Pt left in the the bed with call bell within reach. Pt is not recommended for any further acute OT services and will be discharged to care of nursing staff for remaining length of stay.               Functional Status Assessment   Patient has not had a recent decline in their functional status     Equipment Recommendations   None recommended by OT              Precautions/Restrictions   Precautions Precautions: None Restrictions Weight Bearing Restrictions Per Provider Order: No     Mobility Bed Mobility Overal bed mobility: Independent                  Transfers Overall transfer level: Independent                        Balance Overall balance assessment: Independent                                         ADL either performed or assessed with clinical judgement   ADL Overall ADL's : Independent                                             Vision Baseline Vision/History: 1 Wears glasses Ability to See in Adequate  Light: 0 Adequate Patient Visual Report: No change from baseline Vision Assessment?: No apparent visual deficits     Perception Perception: Not tested       Praxis Praxis: Not tested       Pertinent Vitals/Pain Pain Assessment Pain Assessment: 0-10 Pain Score: 5  Pain Location: Stomach Pain Descriptors / Indicators: Aching Pain Intervention(s): Monitored during session     Extremity/Trunk Assessment Upper Extremity Assessment Upper Extremity Assessment: Overall WFL for tasks assessed   Lower Extremity Assessment Lower Extremity Assessment: Defer to PT evaluation   Cervical / Trunk Assessment Cervical / Trunk Assessment: Normal   Communication Communication Communication: No apparent difficulties   Cognition Arousal: Alert Behavior During Therapy: WFL for tasks assessed/performed Cognition: No apparent impairments                               Following commands: Intact       Cueing  General Comments   Cueing Techniques: Verbal cues                 Home Living Family/patient expects to be discharged to:: Private residence Living Arrangements: Spouse/significant other Available Help at Discharge: Family;Available 24 hours/day Type of Home: House Home Access: Stairs to enter [employer] of Steps: 1 Entrance Stairs-Rails: None Home Layout: Two level Alternate Level Stairs-Number of Steps: 7   Bathroom Shower/Tub: Tub/shower unit;Walk-in shower   Bathroom Toilet: Standard Bathroom Accessibility: No   Home Equipment: [employer] (2 wheels);Cane - single point;Shower seat;Grab bars - tub/shower          Prior Functioning/Environment Prior Level of Function : Independent/Modified Independent             Mobility Comments: Independent ambulation. ADLs Comments: Independent.                            Co-evaluation PT/OT/SLP Co-Evaluation/Treatment: Yes Reason for Co-Treatment: To address functional/ADL  transfers   OT goals addressed during session: ADL's and self-care                       End of Session    Activity Tolerance: Patient tolerated treatment well Patient left: in bed;with call bell/phone within reach  OT Visit Diagnosis: Muscle weakness (generalized) (M62.81)                Time: 8978-8970 OT Time Calculation (min): 8 min Charges:  OT General Charges $OT Visit: 1 Visit OT Evaluation $OT Eval Low Complexity: 1 Low  Annielee Jemmott OT, MOT  Jayson Person 11/20/2024, 12:17 PM

## 2024-11-20 NOTE — Progress Notes (Signed)
 " Progress Note   Patient: Curtis Marquez FMW:[id number] DOB: [birth year] DOA: 11/19/2024     1 DOS: the patient was seen and examined on 11/20/2024   Brief hospital course: Izzac Rockett is a 79 y.o. male with medical history significant for prostate cancer s/p radiation in December 2025, hypertension, who presents to the ER due to shortness of breath and nonproductive cough.  Associated with bilateral lower extremity edema.  States he noted the edema 2 weeks ago.  Admits to compliance with his home HCTZ and Lasix .  Denies having any fevers or chills.  No chest pain.  Endorses very low appetite in the past 2 days.  In the ED, chest x-ray showed bibasilar consolidation, atelectasis, bilateral pleural effusion, proBNP 5000, started on IV Lasix  therapy admitted to [facility] service for further management.  Assessment and Plan: Acute on chronic diastolic CHF: Presented with bilateral lower extremity edema, chest x-ray finding of bilateral pleural effusion, proBNP of 5000.  Last echo in 2017 with preserved ejection fraction. Echocardiogram pending. Continue IV Lasix  40 mg daily. Monitor daily strict input and output. Cardiology consulted, will await input. Due to bilateral consolidation, I will start him on Rocephin  and doxycycline  therapy.  Acute kidney injury: Patient presented with creatinine 1.68. Continue to monitor daily renal function as he is on diuretics.  Prostate cancer s/p radiation: Outpatient urology and medical oncology follow-up advised.  Uncontrolled hypertension: Patient is started on Norvasc  10 mg daily. Losartan  held due to AKI. IV hydralazine  as needed ordered.  Generalized weakness: In the setting of CHF. PT OT evaluation.        Out of bed to chair. Incentive spirometry. Nursing supportive care. Fall, aspiration precautions. Diet:  Diet Orders (From admission, onward)     Start     Ordered   11/19/24 2325  Diet Heart Room service appropriate? Yes; Fluid consistency:  Thin  Diet effective now       Question Answer Comment  Room service appropriate? Yes   Fluid consistency: Thin      11/19/24 2324           DVT prophylaxis: enoxaparin  (LOVENOX ) injection 40 mg Start: 11/20/24 1000  Level of care: Telemetry   Code Status: Full Code  Subjective: Patient is seen and examined today morning.  He is lying comfortably.  Shortness of breath improved.  Did not get out of bed.  Eating fair.  Physical Exam: Vitals:   11/20/24 0300 11/20/24 0500 11/20/24 0612 11/20/24 0625  BP: (!) 176/90 (!) 167/95 (!) 183/97   Pulse:  96 98   Resp: 19 19 18    Temp:  98.1 F (36.7 C) 98.1 F (36.7 C)   TempSrc:   Oral   SpO2: 95% 98% 97%   Weight:    87.3 kg  Height:    5' 8 (1.727 m)    General - Elderly African-American male, no apparent distress HEENT - PERRLA, EOMI, atraumatic head, non tender sinuses. Lung - Clear, basal rales, rhonchi, no wheezes. Heart - S1, S2 heard, no murmurs, rubs, trace pedal edema. Abdomen - Soft, non tender, bowel sounds good Neuro - Alert, awake and oriented x 3, non focal exam. Skin - Warm and dry.  Data Reviewed:      Latest Ref Rng & Units 11/20/2024    5:03 AM 11/19/2024    5:19 PM 02/21/2023    4:20 AM  CBC  WBC 4.0 - 10.5 K/uL 10.7  11.5  13.3   Hemoglobin 13.0 - 17.0 g/dL  11.0  13.0  11.8   Hematocrit 39.0 - 52.0 % 34.7  40.6  36.6   Platelets 150 - 400 K/uL 356  436  336       Latest Ref Rng & Units 11/20/2024    5:03 AM 11/19/2024    5:19 PM 02/21/2023    4:20 AM  BMP  Glucose 70 - 99 mg/dL 881  876  889   BUN 8 - 23 mg/dL 32  26  24   Creatinine 0.61 - 1.24 mg/dL 8.11  8.31  8.91   Sodium 135 - 145 mmol/L 143  144  140   Potassium 3.5 - 5.1 mmol/L 3.6  3.8  3.2   Chloride 98 - 111 mmol/L 108  108  106   CO2 22 - 32 mmol/L 21  21  26    Calcium 8.9 - 10.3 mg/dL 7.8  8.4  7.7    CT ABDOMEN PELVIS W CONTRAST Result Date: 11/19/2024 EXAM: CT ABDOMEN AND PELVIS WITH CONTRAST 11/19/2024 07:27:43 PM TECHNIQUE:  CT of the abdomen and pelvis was performed with the administration of 80 mL iohexol  (OMNIPAQUE ) 300 mg/mL solution. Multiplanar reformatted images are provided for review. Automated exposure control, iterative reconstruction, and/or weight-based adjustment of the mA/kV was utilized to reduce the radiation dose to as low as reasonably achievable. COMPARISON: Chest x-ray from 11/19/2024. CLINICAL HISTORY: Shortness of breath for several days. FINDINGS: LOWER CHEST: Large bilateral pleural effusions are noted. Bibasilar consolidation is noted slightly worse on the right than the left. LIVER: Perihepatic fluid is noted. GALLBLADDER AND BILE DUCTS: Gallbladder is unremarkable. No biliary ductal dilatation. SPLEEN: Perisplenic fluid is noted. PANCREAS: No acute abnormality. ADRENAL GLANDS: No acute abnormality. KIDNEYS, URETERS AND BLADDER: The kidneys show no renal calculi or obstructive changes. Cystic changes are noted within the right kidney. No follow-up is recommended. The bladder is decompressed. No perinephric or periureteral stranding. GI AND BOWEL: The stomach is unremarkable. Multiple edematous loops of small bowel are identified within in the ileum. The colon is predominantly decompressed. The appendix is within normal limits. No obstructive changes are seen. PERITONEUM AND RETROPERITONEUM: Ascites is noted extending into the left inguinal canal. No bowel is noted within. No free air. VASCULATURE: Aorta is normal in caliber. LYMPH NODES: No lymphadenopathy. REPRODUCTIVE ORGANS: No acute abnormality. BONES AND SOFT TISSUES: Osseous structures are within normal limits. No focal soft tissue abnormality. IMPRESSION: 1. Large bilateral pleural effusions with bibasilar consolidation, slightly worse on the right. 2. No evidence of bowel obstruction. 3. Ascites, including extension into the left inguinal canal without bowel involvement. 4. Multiple edematous loops of small bowel, particularly in the ileum, without  obstruction. This may represent underlying inflammatory bowel disease. Electronically signed by: Oneil Devonshire MD 11/19/2024 07:38 PM EST RP Workstation: [id number]   DG Chest Port 1 View Result Date: 11/19/2024 EXAM: 1 VIEW(S) XRAY OF THE CHEST 11/19/2024 05:41:00 PM COMPARISON: 02/24/2021. CLINICAL HISTORY: Shortness of breath. FINDINGS: LUNGS AND PLEURA: Low lung volumes. Small bilateral pleural effusions. Bibasilar consolidations and atelectasis. Right mid lung opacity. No pneumothorax. HEART AND MEDIASTINUM: No acute abnormality of the cardiac and mediastinal silhouettes. BONES AND SOFT TISSUES: No acute osseous abnormality. IMPRESSION: 1. Bibasilar consolidations and atelectasis. 2. Small bilateral pleural effusions. 3. Right mid lung opacity, which may represent additional consolidation. Electronically signed by: Oneil Devonshire MD 11/19/2024 05:50 PM EST RP Workstation: [id number]    Family Communication: Discussed with patient, understand and agree. All questions answered.  Disposition: Status is: Inpatient Remains inpatient  appropriate because: IV Lasix , cardiology input, echocardiogram  Planned Discharge Destination: Home with Home Health     Time spent: 45 minutes  Author: Concepcion Riser, MD 11/20/2024 1:26 PM Secure chat 7am to 7pm For on call review [email or web address].    "

## 2024-11-20 NOTE — Evaluation (Signed)
 Physical Therapy Evaluation Patient Details Name: Curtis Marquez MRN: [id number] DOB: 1946/02/04 Today's Date: 11/20/2024  History of Present Illness  Curtis Marquez is a 79 y.o. male with medical history significant for prostate cancer s/p radiation in December 2025, hypertension, who presents to the ER due to shortness of breath and nonproductive cough.  Associated with bilateral lower extremity edema.  States he noted the edema 2 weeks ago.  Admits to compliance with his home HCTZ and Lasix .  Denies having any fevers or chills.  No chest pain.  Endorses very low appetite in the past 2 days.   Clinical Impression  Patient functioning at baseline for functional mobility and gait demonstrating good return for ambulating in room, hallway without loss of balance or need for an AD. Plan:  Patient discharged from physical therapy to care of nursing for ambulation daily as tolerated for length of stay.          If plan is discharge home, recommend the following:     Can travel by private vehicle        Equipment Recommendations None recommended by PT  Recommendations for Other Services       Functional Status Assessment Patient has not had a recent decline in their functional status     Precautions / Restrictions Precautions Precautions: None Recall of Precautions/Restrictions: Intact Restrictions Weight Bearing Restrictions Per Provider Order: No      Mobility  Bed Mobility Overal bed mobility: Independent                  Transfers Overall transfer level: Independent                      Ambulation/Gait Ambulation/Gait assistance: Independent Gait Distance (Feet): 100 Feet Assistive device: None Gait Pattern/deviations: WFL(Within Functional Limits) Gait velocity: normal     General Gait Details: grossly WFL with good return for ambulating in room, hallway without loss of balance or need for an AD  Stairs            Wheelchair Mobility     Tilt  Bed    Modified Rankin (Stroke Patients Only)       Balance Overall balance assessment: Independent                                           Pertinent Vitals/Pain Pain Assessment Pain Assessment: 0-10 Pain Score: 5  Pain Location: Stomach Pain Descriptors / Indicators: Aching Pain Intervention(s): Monitored during session    Home Living Family/patient expects to be discharged to:: Private residence Living Arrangements: Spouse/significant other Available Help at Discharge: Family;Available 24 hours/day Type of Home: House Home Access: Stairs to enter Entrance Stairs-Rails: None Entrance Stairs-Number of Steps: 1 Alternate Level Stairs-Number of Steps: 7 Home Layout: Two level Home Equipment: Agricultural [employer]ant (2 wheels);Cane - single point;Shower seat;Grab bars - tub/shower      Prior Function Prior Level of Function : Independent/Modified Independent             Mobility Comments: Independent ambulation. ADLs Comments: Independent.     Extremity/Trunk Assessment   Upper Extremity Assessment Upper Extremity Assessment: Defer to OT evaluation    Lower Extremity Assessment Lower Extremity Assessment: Overall WFL for tasks assessed    Cervical / Trunk Assessment Cervical / Trunk Assessment: Normal  Communication   Communication Communication: No apparent difficulties  Cognition Arousal: Alert Behavior During Therapy: WFL for tasks assessed/performed                             Following commands: Intact       Cueing Cueing Techniques: Verbal cues     General Comments      Exercises     Assessment/Plan    PT Assessment Patient does not need any further PT services  PT Problem List         PT Treatment Interventions      PT Goals (Current goals can be found in the Care Plan section)  Acute Rehab PT Goals Patient Stated Goal: return home with family to assist PT Goal Formulation: With patient Time For  Goal Achievement: 11/20/24 Potential to Achieve Goals: Good    Frequency       Co-evaluation PT/OT/SLP Co-Evaluation/Treatment: Yes Reason for Co-Treatment: To address functional/ADL transfers PT goals addressed during session: Mobility/safety with mobility;Balance OT goals addressed during session: ADL's and self-care       AM-PAC PT 6 Clicks Mobility  Outcome Measure Help needed turning from your back to your side while in a flat bed without using bedrails?: None Help needed moving from lying on your back to sitting on the side of a flat bed without using bedrails?: None Help needed moving to and from a bed to a chair (including a wheelchair)?: None Help needed standing up from a chair using your arms (e.g., wheelchair or bedside chair)?: None Help needed to walk in hospital room?: None Help needed climbing 3-5 steps with a railing? : None 6 Click Score: 24    End of Session   Activity Tolerance: Patient tolerated treatment well Patient left: in bed;with call bell/phone within reach Nurse Communication: Mobility status PT Visit Diagnosis: Unsteadiness on feet (R26.81);Other abnormalities of gait and mobility (R26.89);Muscle weakness (generalized) (M62.81)    Time: 8984-8972 PT Time Calculation (min) (ACUTE ONLY): 12 min   Charges:   PT Evaluation $PT Eval Low Complexity: 1 Low PT Treatments $Therapeutic Activity: 8-22 mins PT General Charges $$ ACUTE PT VISIT: 1 Visit         3:09 PM, 11/20/24 Lynwood Music, MPT Physical Therapist with [facility] [phone] office [phone] mobile phone

## 2024-11-20 NOTE — Consult Note (Signed)
 "   CARDIOLOGY CONSULTATION  Patient ID: Curtis Marquez; [id number]; Jun 01, 1946   Admit date: 11/19/2024 Date of Consult: 11/20/2024  Primary Care Provider: Maree Isles, MD Primary Cardiologist: New to [facility]  HISTORY OF PRESENT ILLNESS  Mr. Curtis Marquez is a 79 y.o. male with past medical history outlined below, currently admitted to the hospitalist team with a constellation of symptoms including anorexia over the last few days, intermittent abdominal discomfort of vague description, also recurring leg edema and potentially a 20 pound weight gain since December 2025.  Workup includes evidence of AKI with creatinine 1.68 up to 1.88, low albumin of 2.4, low protein of 6.1, pro-BNP 5440, high-sensitivity troponin T 68, WBC 11.5, chest x-ray demonstrating small bilateral pleural effusions with possible mid right lung consolidation (CT suggests large bilateral pleural effusions but these do not appear visually large on review), ascites, also multiple edematous small bowel loops which could suggest inflammatory bowel disease.  He does have a previous history of small bowel enteritis.  Cardiology consulted given concern for possible HFpEF.  Echocardiogram done today reveals LVEF 60 to 65% with grade 1 diastolic dysfunction, normal RV contraction, moderately dilated left atrium with mild mitral regurgitation, sclerotic aortic valve without stenosis.  ROS  Pertinent review in history of present illness.  No fevers or chills.  No orthopnea or PND.  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   ESOPHAGOGASTRODUODENOSCOPY N/A 03/24/2019   one benign-appearing, intrinsic moderate stenosis was fond s/p dilation. Mild gastritis.   INGUINAL HERNIA REPAIR Right 02/20/2023   Procedure: HERNIA REPAIR INGUINAL INCARCERATED WITH MESH;  Surgeon: Evonnie Dorothyann LABOR, DO;  Location: [facility];  Service: General;  Laterality: Right;   LYMPHADENECTOMY Bilateral 11/13/2013   Procedure: LYMPHADENECTOMY PELVIC LYMPH NODE DISSECTION;  Surgeon: Noretta Ferrara, MD;  Location: [facility];  Service: Urology;  Laterality: Bilateral;   ROBOT ASSISTED LAPAROSCOPIC RADICAL PROSTATECTOMY N/A 11/13/2013   Procedure: ROBOTIC ASSISTED LAPAROSCOPIC RADICAL PROSTATECTOMY LEVEL 2;  Surgeon: Noretta Ferrara, MD;  Location: [facility];  Service: Urology;  Laterality: N/A;   SAVORY DILATION N/A 03/24/2019   Procedure: SAVORY DILATION;  Surgeon: Harvey Margo CROME, MD;  Location: [facility];  Service: Endoscopy;  Laterality: N/A;     INPATIENT MEDICATIONS Scheduled Meds:  amLODipine   10 mg Oral Daily   enoxaparin  (LOVENOX ) injection  40 mg Subcutaneous Q24H   furosemide   40 mg Intravenous BID    PRN Meds: acetaminophen , melatonin, polyethylene glycol, prochlorperazine   ALLERGIES Allergies[1]  SOCIAL HISTORY  Social History   Tobacco Use   Smoking status: Former   Smokeless tobacco: Never  Substance Use Topics   Alcohol  use: No    Alcohol /week: 0.0 standard drinks of alcohol     FAMILY HISTORY   The patient's family history includes CVA in his father; Cancer in his brother; Diabetes in his mother and sister; Hypertension in his mother and sister. There is no history of Colon cancer or Colon  polyps.  PHYSICAL EXAM & DATA  Vitals:   11/20/24 0500 11/20/24 0612 11/20/24 0625 11/20/24 1339  BP: (!) 167/95 (!) 183/97  (!) 176/96  Pulse: 96 98  95  Resp: 19 18  17   Temp: 98.1 F  (36.7 C) 98.1 F (36.7 C)  97.8 F (36.6 C)  TempSrc:  Oral    SpO2: 98% 97%  98%  Weight:   87.3 kg   Height:   5' 8 (1.727 m)     Intake/Output Summary (Last 24 hours) at 11/20/2024 1532 Last data filed at 11/20/2024 1050 Gross per 24 hour  Intake 480 ml  Output --  Net 480 ml   Filed Weights   11/19/24 1257 11/20/24 0625  Weight: 87.1 kg 87.3 kg   Body mass index is 29.26 kg/m.   Gen: Patient appears comfortable at rest. HEENT: Conjunctiva and lids normal. Neck: Supple, no elevated JVP or carotid bruits. Lungs: Decreased breath sounds at the bases. Cardiac: Regular rate and rhythm, no S3 or significant systolic murmur, no pericardial rub. Abdomen: Soft, bowel sounds present, no guarding or rebound. Extremities: 2+ bilateral lower leg edema. Skin: Warm and dry. Musculoskeletal: No kyphosis. Neuropsychiatric: Alert and oriented x3, affect grossly appropriate.  EKG:  An ECG dated 11/19/2024 was personally reviewed today and demonstrated:  Sinus tachycardia with PACs, increased voltage, nonspecific ST changes.  Telemetry:  I personally reviewed telemetry which shows sinus rhythm.  LABORATORY DATA  Chemistry Recent Labs  Lab 11/19/24 1719 11/20/24 0503  NA 144 143  K 3.8 3.6  CL 108 108  CO2 21* 21*  GLUCOSE 123* 118*  BUN 26* 32*  CREATININE 1.68* 1.88*  CALCIUM 8.4* 7.8*  GFRNONAA 42* 37*  ANIONGAP 14 14    Recent Labs  Lab 11/19/24 1719 11/20/24 0503  PROT 6.1* 4.9*  ALBUMIN 2.8* 2.4*  AST 18 15  ALT 11 9  ALKPHOS 132* 101  BILITOT 0.4 0.3   Hematology Recent Labs  Lab 11/19/24 1719 11/20/24 0503  WBC 11.5* 10.7*  RBC 4.46 3.80*  HGB 13.0 11.0*  HCT 40.6 34.7*  MCV 91.0 91.3  MCH 29.1 28.9  MCHC 32.0 31.7  RDW 15.3 15.6*  PLT 436* 356   Cardiac Enzymes Recent Labs  Lab 11/19/24 1719 11/19/24 1941  TRNPT 63* 62*    BNP Recent Labs  Lab 11/19/24 1719  PROBNP 5,440.0*     RADIOLOGY/STUDIES ECHOCARDIOGRAM COMPLETE Result  Date: 11/20/2024    ECHOCARDIOGRAM REPORT   Patient Name:   Curtis Marquez Date of Exam: 11/20/2024 Medical Rec #:  [id number]  Height:       68.0 in Accession #:    [id number] Weight:       192.5 lb Date of Birth:  January 18, 1946  BSA:          2.011 m Patient Age:    78 years   BP:           183/97 mmHg Patient Gender: M          HR:           97 bpm. Exam Location:  [facility] Procedure: 2D Echo, Cardiac Doppler and Color Doppler (Both Spectral and Color            Flow Doppler were utilized during procedure). Indications:    CHF  History:        Patient has prior history of Echocardiogram examinations, most  recent 02/10/2016.  Sonographer:    Odella Brewster Referring Phys: [id number] TERRY LOISE HURST  Sonographer Comments: Image acquisition challenging due to respiratory motion. IMPRESSIONS  1. Left ventricular ejection fraction, by estimation, is 60 to 65%. The left ventricle has normal function. The left ventricle has no regional wall motion abnormalities. Left ventricular diastolic parameters are consistent with Grade I diastolic dysfunction (impaired relaxation).  2. Right ventricular systolic function is normal. The right ventricular size is normal. Tricuspid regurgitation signal is inadequate for assessing PA pressure.  3. Left atrial size was moderately dilated.  4. The mitral valve is grossly normal. Mild mitral valve regurgitation.  5. The aortic valve is tricuspid. There is mild calcification of the aortic valve. Aortic valve regurgitation is not visualized. Aortic valve sclerosis is present, with no evidence of aortic valve stenosis. Aortic valve mean gradient measures 3.0 mmHg.  6. Aortic dilatation noted. There is borderline dilatation of the aortic root, measuring 40 mm.  7. The inferior vena cava is normal in size with greater than 50% respiratory variability, suggesting right atrial pressure of 3 mmHg. Comparison(s): Prior images unable to be directly viewed. SABRA FINDINGS  Left Ventricle: Left  ventricular ejection fraction, by estimation, is 60 to 65%. The left ventricle has normal function. The left ventricle has no regional wall motion abnormalities. The left ventricular internal cavity size was normal in size. There is  borderline left ventricular hypertrophy. Left ventricular diastolic parameters are consistent with Grade I diastolic dysfunction (impaired relaxation). Right Ventricle: The right ventricular size is normal. No increase in right ventricular wall thickness. Right ventricular systolic function is normal. Tricuspid regurgitation signal is inadequate for assessing PA pressure. Left Atrium: Left atrial size was moderately dilated. Right Atrium: Right atrial size was normal in size. Pericardium: There is no evidence of pericardial effusion. Mitral Valve: The mitral valve is grossly normal. Mild mitral valve regurgitation. MV peak gradient, 3.2 mmHg. The mean mitral valve gradient is 2.0 mmHg. Tricuspid Valve: The tricuspid valve is grossly normal. Tricuspid valve regurgitation is trivial. Aortic Valve: The aortic valve is tricuspid. There is mild calcification of the aortic valve. There is mild aortic valve annular calcification. Aortic valve regurgitation is not visualized. Aortic valve sclerosis is present, with no evidence of aortic valve stenosis. Aortic valve mean gradient measures 3.0 mmHg. Aortic valve peak gradient measures 5.9 mmHg. Aortic valve area, by VTI measures 2.87 cm. Pulmonic Valve: The pulmonic valve was not well visualized. Pulmonic valve regurgitation is trivial. Aorta: Aortic dilatation noted. There is borderline dilatation of the aortic root, measuring 40 mm. Venous: The inferior vena cava is normal in size with greater than 50% respiratory variability, suggesting right atrial pressure of 3 mmHg. IAS/Shunts: No atrial level shunt detected by color flow Doppler. Additional Comments: 3D was performed not requiring image post processing on an independent workstation and  was indeterminate.  LEFT VENTRICLE PLAX 2D LVIDd:         4.60 cm      Diastology LVIDs:         2.90 cm      LV e' medial:    5.98 cm/s LV PW:         1.20 cm      LV E/e' medial:  11.6 LV IVS:        0.90 cm      LV e' lateral:   8.38 cm/s LVOT diam:     2.30 cm      LV E/e' lateral: 8.3 LV  SV:         53 LV SV Index:   26 LVOT Area:     4.15 cm LV IVRT:       79 msec  LV Volumes (MOD) LV vol d, MOD A2C: 130.0 ml LV vol d, MOD A4C: 119.0 ml LV vol s, MOD A2C: 59.1 ml LV vol s, MOD A4C: 56.0 ml LV SV MOD A2C:     70.9 ml LV SV MOD A4C:     119.0 ml LV SV MOD BP:      68.2 ml RIGHT VENTRICLE             IVC RV S prime:     13.80 cm/s  IVC diam: 1.60 cm TAPSE (M-mode): 1.5 cm                             PULMONARY VEINS                             Diastolic Velocity: 47.00 cm/s                             S/D Velocity:       1.10                             Systolic Velocity:  50.20 cm/s LEFT ATRIUM             Index        RIGHT ATRIUM           Index LA diam:        3.85 cm 1.91 cm/m   RA Area:     11.30 cm LA Vol (A2C):   70.5 ml 35.06 ml/m  RA Volume:   25.20 ml  12.53 ml/m LA Vol (A4C):   90.0 ml 44.76 ml/m LA Biplane Vol: 85.2 ml 42.37 ml/m  AORTIC VALVE                    PULMONIC VALVE AV Area (Vmax):    3.01 cm     PV Vmax:          1.27 m/s AV Area (Vmean):   2.87 cm     PV Peak grad:     6.5 mmHg AV Area (VTI):     2.87 cm     PR End Diast Vel: 5.02 msec AV Vmax:           121.00 cm/s AV Vmean:          88.000 cm/s AV VTI:            0.185 m AV Peak Grad:      5.9 mmHg AV Mean Grad:      3.0 mmHg LVOT Vmax:         87.80 cm/s LVOT Vmean:        60.800 cm/s LVOT VTI:          0.128 m LVOT/AV VTI ratio: 0.69  AORTA Ao Root diam: 4.00 cm Ao Asc diam:  3.00 cm MITRAL VALVE MV Area (PHT): 3.66 cm    SHUNTS MV Area VTI:   3.66 cm    Systemic VTI:  0.13 m MV Peak grad:  3.2 mmHg    Systemic Diam: 2.30 cm MV Mean grad:  2.0 mmHg MV Vmax:       0.90 m/s MV Vmean:      70.4 cm/s MV Decel Time: 207 msec MV  E velocity: 69.20 cm/s MV A velocity: 75.50 cm/s MV E/A ratio:  0.92 Jayson Sierras MD Electronically signed by Jayson Sierras MD Signature Date/Time: 11/20/2024/10:31:11 AM    Final    CT ABDOMEN PELVIS W CONTRAST Result Date: 11/19/2024 EXAM: CT ABDOMEN AND PELVIS WITH CONTRAST 11/19/2024 07:27:43 PM TECHNIQUE: CT of the abdomen and pelvis was performed with the administration of 80 mL iohexol  (OMNIPAQUE ) 300 mg/mL solution. Multiplanar reformatted images are provided for review. Automated exposure control, iterative reconstruction, and/or weight-based adjustment of the mA/kV was utilized to reduce the radiation dose to as low as reasonably achievable. COMPARISON: Chest x-ray from 11/19/2024. CLINICAL HISTORY: Shortness of breath for several days. FINDINGS: LOWER CHEST: Large bilateral pleural effusions are noted. Bibasilar consolidation is noted slightly worse on the right than the left. LIVER: Perihepatic fluid is noted. GALLBLADDER AND BILE DUCTS: Gallbladder is unremarkable. No biliary ductal dilatation. SPLEEN: Perisplenic fluid is noted. PANCREAS: No acute abnormality. ADRENAL GLANDS: No acute abnormality. KIDNEYS, URETERS AND BLADDER: The kidneys show no renal calculi or obstructive changes. Cystic changes are noted within the right kidney. No follow-up is recommended. The bladder is decompressed. No perinephric or periureteral stranding. GI AND BOWEL: The stomach is unremarkable. Multiple edematous loops of small bowel are identified within in the ileum. The colon is predominantly decompressed. The appendix is within normal limits. No obstructive changes are seen. PERITONEUM AND RETROPERITONEUM: Ascites is noted extending into the left inguinal canal. No bowel is noted within. No free air. VASCULATURE: Aorta is normal in caliber. LYMPH NODES: No lymphadenopathy. REPRODUCTIVE ORGANS: No acute abnormality. BONES AND SOFT TISSUES: Osseous structures are within normal limits. No focal soft tissue  abnormality. IMPRESSION: 1. Large bilateral pleural effusions with bibasilar consolidation, slightly worse on the right. 2. No evidence of bowel obstruction. 3. Ascites, including extension into the left inguinal canal without bowel involvement. 4. Multiple edematous loops of small bowel, particularly in the ileum, without obstruction. This may represent underlying inflammatory bowel disease. Electronically signed by: Oneil Devonshire MD 11/19/2024 07:38 PM EST RP Workstation: [id number]   DG Chest Port 1 View Result Date: 11/19/2024 EXAM: 1 VIEW(S) XRAY OF THE CHEST 11/19/2024 05:41:00 PM COMPARISON: 02/24/2021. CLINICAL HISTORY: Shortness of breath. FINDINGS: LUNGS AND PLEURA: Low lung volumes. Small bilateral pleural effusions. Bibasilar consolidations and atelectasis. Right mid lung opacity. No pneumothorax. HEART AND MEDIASTINUM: No acute abnormality of the cardiac and mediastinal silhouettes. BONES AND SOFT TISSUES: No acute osseous abnormality. IMPRESSION: 1. Bibasilar consolidations and atelectasis. 2. Small bilateral pleural effusions. 3. Right mid lung opacity, which may represent additional consolidation. Electronically signed by: Oneil Devonshire MD 11/19/2024 05:50 PM EST RP Workstation: [id number]    ASSESSMENT & PLAN  1.  Patient presents reporting recurring bilateral leg edema since December 2025 (using compression stockings at home), recent intermittent abdominal discomfort with anorexia but no definite stool changes, also potentially a 20 pound weight gain.  Based on workup so far, not clear that HFpEF is unifying diagnosis.  He has low protein stores, possible inflammatory changes involving the small bowel based on CT imaging associated with ascites and also bilateral pleural effusions.  Chart indicates prior history of small bowel enteritis.  2.  AKI, creatinine 1.68 up to 1.88.  3.  Primary hypertension, recent blood pressure trend elevated.  Home medical regimen included Cozaar  50 mg daily.  Now on Norvasc  10 mg daily while hospitalized.  4.  History of prostate cancer status post radical prostatectomy.  Consider further evaluation including ESR and sed rate, may want to discuss with GI to get their opinion on abdominal CT findings as it relates to potential component of small bowel enteritis.  Check urinalysis to assess for protein loss although could be gastrointestinal in etiology.  He may be third spacing in the setting of low protein stores.  Not clear that diuresis alone will resolve his symptoms.  If enough pleural fluid present for diagnostic thoracentesis, that may also be of utility.  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  For questions or updates, please contact [facility] Please consult [email or web address] for contact info under   Signed, Jayson Sierras, MD  11/20/2024 3:32 PM     [1] No Known Allergies  "

## 2024-11-20 NOTE — Plan of Care (Signed)
   Problem: Clinical Measurements: Goal: Ability to maintain clinical measurements within normal limits will improve Outcome: Progressing   Problem: Clinical Measurements: Goal: Diagnostic test results will improve Outcome: Progressing   Problem: Activity: Goal: Risk for activity intolerance will decrease Outcome: Progressing

## 2024-11-20 NOTE — Progress Notes (Signed)
 Inpatient Care Management (ICM) has reviewed patient and no other ICM needs have been identified at this time. We will continue to monitor patient advancement through interdisciplinary progression rounds. If new patient transition needs arise, please place a ICM consult.    11/20/24 1341  TOC Brief Assessment  Insurance and Status Reviewed  Patient has primary care physician Yes  Home environment has been reviewed From Home  Prior level of function: Independent  Prior/Current Home Services No current home services  Social Drivers of Health Review SDOH reviewed no interventions necessary  Readmission risk has been reviewed Yes  Transition of care needs no transition of care needs at this time

## 2024-11-20 NOTE — ED Notes (Signed)
 CCMD has been notified of cardiac monitoring.

## 2024-11-21 ENCOUNTER — Inpatient Hospital Stay (HOSPITAL_COMMUNITY)

## 2024-11-21 DIAGNOSIS — C61 Malignant neoplasm of prostate: Secondary | ICD-10-CM | POA: Diagnosis not present

## 2024-11-21 DIAGNOSIS — R600 Localized edema: Secondary | ICD-10-CM | POA: Diagnosis not present

## 2024-11-21 DIAGNOSIS — N179 Acute kidney failure, unspecified: Secondary | ICD-10-CM | POA: Diagnosis not present

## 2024-11-21 DIAGNOSIS — R601 Generalized edema: Secondary | ICD-10-CM | POA: Diagnosis not present

## 2024-11-21 DIAGNOSIS — I5033 Acute on chronic diastolic (congestive) heart failure: Secondary | ICD-10-CM | POA: Diagnosis not present

## 2024-11-21 DIAGNOSIS — R531 Weakness: Secondary | ICD-10-CM | POA: Diagnosis not present

## 2024-11-21 DIAGNOSIS — I10 Essential (primary) hypertension: Secondary | ICD-10-CM | POA: Diagnosis not present

## 2024-11-21 LAB — URINALYSIS, COMPLETE (UACMP) WITH MICROSCOPIC
Bilirubin Urine: NEGATIVE
Glucose, UA: NEGATIVE mg/dL
Ketones, ur: NEGATIVE mg/dL
Leukocytes,Ua: NEGATIVE
Nitrite: NEGATIVE
Protein, ur: 100 mg/dL — AB
Specific Gravity, Urine: 1.015 (ref 1.005–1.030)
pH: 5 (ref 5.0–8.0)

## 2024-11-21 LAB — PROTEIN / CREATININE RATIO, URINE
Creatinine, Urine: 93 mg/dL
Protein Creatinine Ratio: 1.6 mg/mg — ABNORMAL HIGH
Total Protein, Urine: 152 mg/dL

## 2024-11-21 LAB — BASIC METABOLIC PANEL WITH GFR
Anion gap: 13 (ref 5–15)
BUN: 35 mg/dL — ABNORMAL HIGH (ref 8–23)
CO2: 22 mmol/L (ref 22–32)
Calcium: 7.4 mg/dL — ABNORMAL LOW (ref 8.9–10.3)
Chloride: 110 mmol/L (ref 98–111)
Creatinine, Ser: 1.76 mg/dL — ABNORMAL HIGH (ref 0.61–1.24)
GFR, Estimated: 40 mL/min — ABNORMAL LOW
Glucose, Bld: 103 mg/dL — ABNORMAL HIGH (ref 70–99)
Potassium: 3.2 mmol/L — ABNORMAL LOW (ref 3.5–5.1)
Sodium: 145 mmol/L (ref 135–145)

## 2024-11-21 MED ORDER — METOPROLOL TARTRATE 25 MG PO TABS
25.0000 mg | ORAL_TABLET | Freq: Two times a day (BID) | ORAL | Status: DC
  Administered 2024-11-21 – 2024-11-22 (×2): 25 mg via ORAL
  Filled 2024-11-21 (×2): qty 1

## 2024-11-21 MED ORDER — POTASSIUM CHLORIDE CRYS ER 20 MEQ PO TBCR
40.0000 meq | EXTENDED_RELEASE_TABLET | Freq: Once | ORAL | Status: AC
  Administered 2024-11-21: 40 meq via ORAL
  Filled 2024-11-21: qty 2

## 2024-11-21 MED ORDER — TORSEMIDE 20 MG PO TABS: 20.0000 mg | ORAL_TABLET | Freq: Every day | ORAL | Status: DC

## 2024-11-21 MED ORDER — TORSEMIDE 20 MG PO TABS
20.0000 mg | ORAL_TABLET | Freq: Every day | ORAL | Status: DC
  Administered 2024-11-22: 20 mg via ORAL
  Filled 2024-11-21: qty 1

## 2024-11-21 NOTE — Progress Notes (Signed)
 " Progress Note   Patient: Curtis Marquez FMW:[id number] DOB: December 30, 1945 DOA: 11/19/2024     2 DOS: the patient was seen and examined on 11/21/2024   Brief hospital course: Curtis Marquez is a 79 y.o. male with medical history significant for prostate cancer s/p radiation in December 2025, hypertension, who presents to the ER due to shortness of breath and nonproductive cough.  Associated with bilateral lower extremity edema.  States he noted the edema 2 weeks ago.  Admits to compliance with his home HCTZ and Lasix .  Denies having any fevers or chills.  No chest pain.  Endorses very low appetite in the past 2 days.  In the ED, chest x-ray showed bibasilar consolidation, atelectasis, bilateral pleural effusion, proBNP 5000, started on IV Lasix  therapy admitted to [facility] service for further management.  Assessment and Plan: Bilateral lower extremity edema: Generalized anasarca- Possible due to hypoalbuminemia, Echo showed grade 1 diastolic dysfunction. Will transition IV lasix  to torsemide  20 mg daily. Appreciate Cardiology follow up.  Monitor daily strict input and output.  Bilateral consolidation- Continue Rocephin  and doxycycline  therapy.  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 Baseline cr around 1.3. Caution with diuresis. Continue to monitor daily renal function, avoid nephrotoxic drugs. Urine protein high noted. Check renal sono. Outpatient nephrology follow up suggested.  Uncontrolled hypertension: BP still high on Norvasc  10 mg daily. Lopressor  25mg  bid started. Losartan  held due to AKI. IV hydralazine  as needed ordered.  Small bowel inflammation seen on CT abdomen. Advised GI follow up as outpatient.  Prostate cancer s/p radiation: Outpatient urology and medical oncology follow-up advised.  Generalized weakness: He feels better today, worked with PT. PT OT advised no follow up.     Out of bed to chair. Incentive spirometry. Nursing  supportive care. Fall, aspiration precautions. Diet:  Diet Orders (From admission, onward)     Start     Ordered   11/19/24 2325  Diet Heart Room service appropriate? Yes; Fluid consistency: Thin  Diet effective now       Question Answer Comment  Room service appropriate? Yes   Fluid consistency: Thin      11/19/24 2324           DVT prophylaxis: enoxaparin  (LOVENOX ) injection 40 mg Start: 11/20/24 1000  Level of care: Telemetry   Code Status: Full Code  Subjective: Patient is seen and examined today morning.  He is lying comfortably.  Feels better today.  Worked with PT. Eating fair.  Physical Exam: Vitals:   11/20/24 2152 11/21/24 0219 11/21/24 0500 11/21/24 1257  BP: (!) 196/104 (!) 178/81 (!) 165/80 (!) 171/95  Pulse: 98 (!) 102  99  Resp: 18 18 20 18   Temp: 98.3 F (36.8 C) 98.1 F (36.7 C) 98 F (36.7 C) 98.2 F (36.8 C)  TempSrc: Oral Oral Oral   SpO2: 97% 94% 93% 98%  Weight:   85 kg   Height:        General - Elderly African-American male, no apparent distress HEENT - PERRLA, EOMI, atraumatic head, non tender sinuses. Lung - Clear, basal rales, rhonchi, no wheezes. Heart - S1, S2 heard, no murmurs, rubs, trace pedal edema. Abdomen - Soft, non tender, bowel sounds good Neuro - Alert, awake and oriented x 3, non focal exam. Skin - Warm and dry.  Data Reviewed:      Latest Ref Rng & Units 11/20/2024    5:03 AM 11/19/2024    5:19 PM 02/21/2023  4:20 AM  CBC  WBC 4.0 - 10.5 K/uL 10.7  11.5  13.3   Hemoglobin 13.0 - 17.0 g/dL 88.9  86.9  88.1   Hematocrit 39.0 - 52.0 % 34.7  40.6  36.6   Platelets 150 - 400 K/uL 356  436  336       Latest Ref Rng & Units 11/21/2024    8:25 AM 11/20/2024    5:03 AM 11/19/2024    5:19 PM  BMP  Glucose 70 - 99 mg/dL 896  881  876   BUN 8 - 23 mg/dL 35  32  26   Creatinine 0.61 - 1.24 mg/dL 8.23  8.11  8.31   Sodium 135 - 145 mmol/L 145  143  144   Potassium 3.5 - 5.1 mmol/L 3.2  3.6  3.8   Chloride 98 - 111 mmol/L  110  108  108   CO2 22 - 32 mmol/L 22  21  21    Calcium 8.9 - 10.3 mg/dL 7.4  7.8  8.4    CT ABDOMEN PELVIS W CONTRAST Result Date: 11/19/2024 EXAM: CT ABDOMEN AND PELVIS WITH CONTRAST 11/19/2024 07:27:43 PM TECHNIQUE: CT of the abdomen and pelvis was performed with the administration of 80 mL iohexol  (OMNIPAQUE ) 300 mg/mL solution. Multiplanar reformatted images are provided for review. Automated exposure control, iterative reconstruction, and/or weight-based adjustment of the mA/kV was utilized to reduce the radiation dose to as low as reasonably achievable. COMPARISON: Chest x-ray from 11/19/2024. CLINICAL HISTORY: Shortness of breath for several days. FINDINGS: LOWER CHEST: Large bilateral pleural effusions are noted. Bibasilar consolidation is noted slightly worse on the right than the left. LIVER: Perihepatic fluid is noted. GALLBLADDER AND BILE DUCTS: Gallbladder is unremarkable. No biliary ductal dilatation. SPLEEN: Perisplenic fluid is noted. PANCREAS: No acute abnormality. ADRENAL GLANDS: No acute abnormality. KIDNEYS, URETERS AND BLADDER: The kidneys show no renal calculi or obstructive changes. Cystic changes are noted within the right kidney. No follow-up is recommended. The bladder is decompressed. No perinephric or periureteral stranding. GI AND BOWEL: The stomach is unremarkable. Multiple edematous loops of small bowel are identified within in the ileum. The colon is predominantly decompressed. The appendix is within normal limits. No obstructive changes are seen. PERITONEUM AND RETROPERITONEUM: Ascites is noted extending into the left inguinal canal. No bowel is noted within. No free air. VASCULATURE: Aorta is normal in caliber. LYMPH NODES: No lymphadenopathy. REPRODUCTIVE ORGANS: No acute abnormality. BONES AND SOFT TISSUES: Osseous structures are within normal limits. No focal soft tissue abnormality. IMPRESSION: 1. Large bilateral pleural effusions with bibasilar consolidation, slightly  worse on the right. 2. No evidence of bowel obstruction. 3. Ascites, including extension into the left inguinal canal without bowel involvement. 4. Multiple edematous loops of small bowel, particularly in the ileum, without obstruction. This may represent underlying inflammatory bowel disease. Electronically signed by: Oneil Devonshire MD 11/19/2024 07:38 PM EST RP Workstation: [id number]   DG Chest Port 1 View Result Date: 11/19/2024 EXAM: 1 VIEW(S) XRAY OF THE CHEST 11/19/2024 05:41:00 PM COMPARISON: 02/24/2021. CLINICAL HISTORY: Shortness of breath. FINDINGS: LUNGS AND PLEURA: Low lung volumes. Small bilateral pleural effusions. Bibasilar consolidations and atelectasis. Right mid lung opacity. No pneumothorax. HEART AND MEDIASTINUM: No acute abnormality of the cardiac and mediastinal silhouettes. BONES AND SOFT TISSUES: No acute osseous abnormality. IMPRESSION: 1. Bibasilar consolidations and atelectasis. 2. Small bilateral pleural effusions. 3. Right mid lung opacity, which may represent additional consolidation. Electronically signed by: Oneil Devonshire MD 11/19/2024 05:50 PM EST RP Workstation:  [id number]    Family Communication: Discussed with patient, understand and agree. All questions answered.  Disposition: Status is: Inpatient Remains inpatient appropriate because: diuresis, monitor kidney function, electrolytes.  Planned Discharge Destination: Home with Home Health     Time spent: 43 minutes  Author: Concepcion Riser, MD 11/21/2024 3:35 PM Secure chat 7am to 7pm For on call review [email or web address].    "

## 2024-11-21 NOTE — Progress Notes (Signed)
 "  Rounding Note   Patient Name: Curtis Marquez Date of Encounter: 11/21/2024  [facility] Cardiologist: New  Subjective No complaints  Scheduled Meds:  amLODipine   10 mg Oral Daily   doxycycline   100 mg Oral Q12H   enoxaparin  (LOVENOX ) injection  40 mg Subcutaneous Q24H   Continuous Infusions:  cefTRIAXone  (ROCEPHIN )  IV 200 mL/hr at 11/20/24 1742   PRN Meds: acetaminophen , alum & mag hydroxide-simeth, hydrALAZINE , melatonin, polyethylene glycol, prochlorperazine    Vital Signs  Vitals:   11/20/24 1339 11/20/24 2152 11/21/24 0219 11/21/24 0500  BP: (!) 176/96 (!) 196/104 (!) 178/81 (!) 165/80  Pulse: 95 98 (!) 102   Resp: 17 18 18 20   Temp: 97.8 F (36.6 C) 98.3 F (36.8 C) 98.1 F (36.7 C) 98 F (36.7 C)  TempSrc:  Oral Oral Oral  SpO2: 98% 97% 94% 93%  Weight:    85 kg  Height:        Intake/Output Summary (Last 24 hours) at 11/21/2024 0854 Last data filed at 11/20/2024 2221 Gross per 24 hour  Intake 736.27 ml  Output --  Net 736.27 ml      11/21/2024    5:00 AM 11/20/2024    6:25 AM 11/19/2024   12:57 PM  Last 3 Weights  Weight (lbs) 187 lb 6.3 oz 192 lb 7.4 oz 192 lb  Weight (kg) 85 kg 87.3 kg 87.091 kg      Telemetry NSR - Personally Reviewed  ECG  N/a - Personally Reviewed  Physical Exam  GEN: No acute distress.   Neck: No JVD Cardiac: RRR, no murmurs, rubs, or gallops.  Respiratory: decreased breath sounds bilateral bases GI: Soft, nontender, non-distended  MS: 1+ bilaeral LE edema Neuro:  Nonfocal  Psych: Normal affect   Labs High Sensitivity Troponin:  No results for input(s): TROPONINIHS in the last 720 hours.  Recent Labs  Lab 11/19/24 1719 11/19/24 1941  TRNPT 63* 62*       Chemistry Recent Labs  Lab 11/19/24 1719 11/20/24 0503  NA 144 143  K 3.8 3.6  CL 108 108  CO2 21* 21*  GLUCOSE 123* 118*  BUN 26* 32*  CREATININE 1.68* 1.88*  CALCIUM 8.4* 7.8*  MG  --  2.1  PROT 6.1* 4.9*  ALBUMIN 2.8* 2.4*  AST 18 15   ALT 11 9  ALKPHOS 132* 101  BILITOT 0.4 0.3  GFRNONAA 42* 37*  ANIONGAP 14 14    Lipids No results for input(s): CHOL, TRIG, HDL, LABVLDL, LDLCALC, CHOLHDL in the last 168 hours.  Hematology Recent Labs  Lab 11/19/24 1719 11/20/24 0503  WBC 11.5* 10.7*  RBC 4.46 3.80*  HGB 13.0 11.0*  HCT 40.6 34.7*  MCV 91.0 91.3  MCH 29.1 28.9  MCHC 32.0 31.7  RDW 15.3 15.6*  PLT 436* 356   Thyroid  No results for input(s): TSH, FREET4 in the last 168 hours.  BNP Recent Labs  Lab 11/19/24 1719  PROBNP 5,440.0*    DDimer No results for input(s): DDIMER in the last 168 hours.   Radiology  ECHOCARDIOGRAM COMPLETE Result Date: 11/20/2024    ECHOCARDIOGRAM REPORT   Patient Name:   Curtis Marquez Date of Exam: 11/20/2024 Medical Rec #:  [id number]  Height:       68.0 in Accession #:    [id number] Weight:       192.5 lb Date of Birth:  [birth year]  BSA:          2.011 m Patient Age:  78 years   BP:           183/97 mmHg Patient Gender: M          HR:           97 bpm. Exam Location:  [facility] Procedure: 2D Echo, Cardiac Doppler and Color Doppler (Both Spectral and Color            Flow Doppler were utilized during procedure). Indications:    CHF  History:        Patient has prior history of Echocardiogram examinations, most                 recent 02/10/2016.  Sonographer:    Odella Brewster Referring Phys: [id number] TERRY LOISE HURST  Sonographer Comments: Image acquisition challenging due to respiratory motion. IMPRESSIONS  1. Left ventricular ejection fraction, by estimation, is 60 to 65%. The left ventricle has normal function. The left ventricle has no regional wall motion abnormalities. Left ventricular diastolic parameters are consistent with Grade I diastolic dysfunction (impaired relaxation).  2. Right ventricular systolic function is normal. The right ventricular size is normal. Tricuspid regurgitation signal is inadequate for assessing PA pressure.  3. Left atrial size was moderately  dilated.  4. The mitral valve is grossly normal. Mild mitral valve regurgitation.  5. The aortic valve is tricuspid. There is mild calcification of the aortic valve. Aortic valve regurgitation is not visualized. Aortic valve sclerosis is present, with no evidence of aortic valve stenosis. Aortic valve mean gradient measures 3.0 mmHg.  6. Aortic dilatation noted. There is borderline dilatation of the aortic root, measuring 40 mm.  7. The inferior vena cava is normal in size with greater than 50% respiratory variability, suggesting right atrial pressure of 3 mmHg. Comparison(s): Prior images unable to be directly viewed. SABRA FINDINGS  Left Ventricle: Left ventricular ejection fraction, by estimation, is 60 to 65%. The left ventricle has normal function. The left ventricle has no regional wall motion abnormalities. The left ventricular internal cavity size was normal in size. There is  borderline left ventricular hypertrophy. Left ventricular diastolic parameters are consistent with Grade I diastolic dysfunction (impaired relaxation). Right Ventricle: The right ventricular size is normal. No increase in right ventricular wall thickness. Right ventricular systolic function is normal. Tricuspid regurgitation signal is inadequate for assessing PA pressure. Left Atrium: Left atrial size was moderately dilated. Right Atrium: Right atrial size was normal in size. Pericardium: There is no evidence of pericardial effusion. Mitral Valve: The mitral valve is grossly normal. Mild mitral valve regurgitation. MV peak gradient, 3.2 mmHg. The mean mitral valve gradient is 2.0 mmHg. Tricuspid Valve: The tricuspid valve is grossly normal. Tricuspid valve regurgitation is trivial. Aortic Valve: The aortic valve is tricuspid. There is mild calcification of the aortic valve. There is mild aortic valve annular calcification. Aortic valve regurgitation is not visualized. Aortic valve sclerosis is present, with no evidence of aortic valve  stenosis. Aortic valve mean gradient measures 3.0 mmHg. Aortic valve peak gradient measures 5.9 mmHg. Aortic valve area, by VTI measures 2.87 cm. Pulmonic Valve: The pulmonic valve was not well visualized. Pulmonic valve regurgitation is trivial. Aorta: Aortic dilatation noted. There is borderline dilatation of the aortic root, measuring 40 mm. Venous: The inferior vena cava is normal in size with greater than 50% respiratory variability, suggesting right atrial pressure of 3 mmHg. IAS/Shunts: No atrial level shunt detected by color flow Doppler. Additional Comments: 3D was performed not requiring image post processing on an  independent workstation and was indeterminate.  LEFT VENTRICLE PLAX 2D LVIDd:         4.60 cm      Diastology LVIDs:         2.90 cm      LV e' medial:    5.98 cm/s LV PW:         1.20 cm      LV E/e' medial:  11.6 LV IVS:        0.90 cm      LV e' lateral:   8.38 cm/s LVOT diam:     2.30 cm      LV E/e' lateral: 8.3 LV SV:         53 LV SV Index:   26 LVOT Area:     4.15 cm LV IVRT:       79 msec  LV Volumes (MOD) LV vol d, MOD A2C: 130.0 ml LV vol d, MOD A4C: 119.0 ml LV vol s, MOD A2C: 59.1 ml LV vol s, MOD A4C: 56.0 ml LV SV MOD A2C:     70.9 ml LV SV MOD A4C:     119.0 ml LV SV MOD BP:      68.2 ml RIGHT VENTRICLE             IVC RV S prime:     13.80 cm/s  IVC diam: 1.60 cm TAPSE (M-mode): 1.5 cm                             PULMONARY VEINS                             Diastolic Velocity: 47.00 cm/s                             S/D Velocity:       1.10                             Systolic Velocity:  50.20 cm/s LEFT ATRIUM             Index        RIGHT ATRIUM           Index LA diam:        3.85 cm 1.91 cm/m   RA Area:     11.30 cm LA Vol (A2C):   70.5 ml 35.06 ml/m  RA Volume:   25.20 ml  12.53 ml/m LA Vol (A4C):   90.0 ml 44.76 ml/m LA Biplane Vol: 85.2 ml 42.37 ml/m  AORTIC VALVE                    PULMONIC VALVE AV Area (Vmax):    3.01 cm     PV Vmax:          1.27 m/s AV Area  (Vmean):   2.87 cm     PV Peak grad:     6.5 mmHg AV Area (VTI):     2.87 cm     PR End Diast Vel: 5.02 msec AV Vmax:           121.00 cm/s AV Vmean:          88.000 cm/s AV VTI:            0.185 m  AV Peak Grad:      5.9 mmHg AV Mean Grad:      3.0 mmHg LVOT Vmax:         87.80 cm/s LVOT Vmean:        60.800 cm/s LVOT VTI:          0.128 m LVOT/AV VTI ratio: 0.69  AORTA Ao Root diam: 4.00 cm Ao Asc diam:  3.00 cm MITRAL VALVE MV Area (PHT): 3.66 cm    SHUNTS MV Area VTI:   3.66 cm    Systemic VTI:  0.13 m MV Peak grad:  3.2 mmHg    Systemic Diam: 2.30 cm MV Mean grad:  2.0 mmHg MV Vmax:       0.90 m/s MV Vmean:      70.4 cm/s MV Decel Time: 207 msec MV E velocity: 69.20 cm/s MV A velocity: 75.50 cm/s MV E/A ratio:  0.92 Jayson Sierras MD Electronically signed by Jayson Sierras MD Signature Date/Time: 11/20/2024/10:31:11 AM    Final    CT ABDOMEN PELVIS W CONTRAST Result Date: 11/19/2024 EXAM: CT ABDOMEN AND PELVIS WITH CONTRAST 11/19/2024 07:27:43 PM TECHNIQUE: CT of the abdomen and pelvis was performed with the administration of 80 mL iohexol  (OMNIPAQUE ) 300 mg/mL solution. Multiplanar reformatted images are provided for review. Automated exposure control, iterative reconstruction, and/or weight-based adjustment of the mA/kV was utilized to reduce the radiation dose to as low as reasonably achievable. COMPARISON: Chest x-ray from 11/19/2024. CLINICAL HISTORY: Shortness of breath for several days. FINDINGS: LOWER CHEST: Large bilateral pleural effusions are noted. Bibasilar consolidation is noted slightly worse on the right than the left. LIVER: Perihepatic fluid is noted. GALLBLADDER AND BILE DUCTS: Gallbladder is unremarkable. No biliary ductal dilatation. SPLEEN: Perisplenic fluid is noted. PANCREAS: No acute abnormality. ADRENAL GLANDS: No acute abnormality. KIDNEYS, URETERS AND BLADDER: The kidneys show no renal calculi or obstructive changes. Cystic changes are noted within the right kidney. No  follow-up is recommended. The bladder is decompressed. No perinephric or periureteral stranding. GI AND BOWEL: The stomach is unremarkable. Multiple edematous loops of small bowel are identified within in the ileum. The colon is predominantly decompressed. The appendix is within normal limits. No obstructive changes are seen. PERITONEUM AND RETROPERITONEUM: Ascites is noted extending into the left inguinal canal. No bowel is noted within. No free air. VASCULATURE: Aorta is normal in caliber. LYMPH NODES: No lymphadenopathy. REPRODUCTIVE ORGANS: No acute abnormality. BONES AND SOFT TISSUES: Osseous structures are within normal limits. No focal soft tissue abnormality. IMPRESSION: 1. Large bilateral pleural effusions with bibasilar consolidation, slightly worse on the right. 2. No evidence of bowel obstruction. 3. Ascites, including extension into the left inguinal canal without bowel involvement. 4. Multiple edematous loops of small bowel, particularly in the ileum, without obstruction. This may represent underlying inflammatory bowel disease. Electronically signed by: Oneil Devonshire MD 11/19/2024 07:38 PM EST RP Workstation: [id number]   DG Chest Port 1 View Result Date: 11/19/2024 EXAM: 1 VIEW(S) XRAY OF THE CHEST 11/19/2024 05:41:00 PM COMPARISON: 02/24/2021. CLINICAL HISTORY: Shortness of breath. FINDINGS: LUNGS AND PLEURA: Low lung volumes. Small bilateral pleural effusions. Bibasilar consolidations and atelectasis. Right mid lung opacity. No pneumothorax. HEART AND MEDIASTINUM: No acute abnormality of the cardiac and mediastinal silhouettes. BONES AND SOFT TISSUES: No acute osseous abnormality. IMPRESSION: 1. Bibasilar consolidations and atelectasis. 2. Small bilateral pleural effusions. 3. Right mid lung opacity, which may represent additional consolidation. Electronically signed by: Oneil Devonshire MD 11/19/2024 05:50 PM EST RP Workstation: [id number]  Assessment & Plan   1.Bilateral LE edema - Jan  2026 echo: LVEF 60-65%, no WMAs, grade I dd, normarl RV function, mod LAE, mild MR - CXR bibasilar consolidations, small bilateral effusions, right mid lung opacity. proBNP 5440 - CT large bilateral effusions with bibasilar consolidation, ascites - check reds vest  - received IV lasix  40mg  bid yesterday, once this AM. I/Os are incomplete. Wts 192-->187 lbs. Was 179 lbs in October.  - mild variations in Cr without clear trend, uptrending BUN. Albumin 2.8 - He may be third spacing in the setting of low protein stores. Not clear that diuresis alone will resolve his symptoms.  - would discuss with GI if potential GI etiology contributing given CT findings, low albumin. Perhaps enteritis/protein losing enteropathy.  - with AKI and edema check urine protein - overall fairly mild echo findings with just grade I diastolic dysfunction would not typically see this degree of fluid retention if cardiac alone.  -trial of torsemide  20mg  daily to see if better toelrated from renal standpoint, more gradual diuresis.        For questions or updates, please contact [facility] Please consult [email or web address] for contact info under       Signed, Alvan Carrier, MD  11/21/2024, 8:54 AM    "

## 2024-11-21 NOTE — Plan of Care (Signed)
  Problem: Clinical Measurements: Goal: Ability to maintain clinical measurements within normal limits will improve Outcome: Progressing   Problem: Clinical Measurements: Goal: Will remain free from infection Outcome: Progressing   Problem: Clinical Measurements: Goal: Cardiovascular complication will be avoided Outcome: Progressing   Problem: Activity: Goal: Risk for activity intolerance will decrease Outcome: Progressing

## 2024-11-22 DIAGNOSIS — R531 Weakness: Secondary | ICD-10-CM | POA: Diagnosis not present

## 2024-11-22 DIAGNOSIS — C61 Malignant neoplasm of prostate: Secondary | ICD-10-CM | POA: Diagnosis not present

## 2024-11-22 DIAGNOSIS — E8809 Other disorders of plasma-protein metabolism, not elsewhere classified: Secondary | ICD-10-CM

## 2024-11-22 DIAGNOSIS — R600 Localized edema: Secondary | ICD-10-CM | POA: Diagnosis not present

## 2024-11-22 DIAGNOSIS — I10 Essential (primary) hypertension: Secondary | ICD-10-CM | POA: Diagnosis not present

## 2024-11-22 LAB — CBC
HCT: 33.4 % — ABNORMAL LOW (ref 39.0–52.0)
Hemoglobin: 10.8 g/dL — ABNORMAL LOW (ref 13.0–17.0)
MCH: 29.3 pg (ref 26.0–34.0)
MCHC: 32.3 g/dL (ref 30.0–36.0)
MCV: 90.8 fL (ref 80.0–100.0)
Platelets: 343 10*3/uL (ref 150–400)
RBC: 3.68 MIL/uL — ABNORMAL LOW (ref 4.22–5.81)
RDW: 15.6 % — ABNORMAL HIGH (ref 11.5–15.5)
WBC: 8.2 10*3/uL (ref 4.0–10.5)
nRBC: 0 % (ref 0.0–0.2)

## 2024-11-22 LAB — BASIC METABOLIC PANEL WITH GFR
Anion gap: 11 (ref 5–15)
BUN: 32 mg/dL — ABNORMAL HIGH (ref 8–23)
CO2: 24 mmol/L (ref 22–32)
Calcium: 7.5 mg/dL — ABNORMAL LOW (ref 8.9–10.3)
Chloride: 108 mmol/L (ref 98–111)
Creatinine, Ser: 1.63 mg/dL — ABNORMAL HIGH (ref 0.61–1.24)
GFR, Estimated: 43 mL/min — ABNORMAL LOW
Glucose, Bld: 103 mg/dL — ABNORMAL HIGH (ref 70–99)
Potassium: 3.4 mmol/L — ABNORMAL LOW (ref 3.5–5.1)
Sodium: 143 mmol/L (ref 135–145)

## 2024-11-22 MED ORDER — POTASSIUM CHLORIDE ER 10 MEQ PO TBCR: 20.0000 meq | EXTENDED_RELEASE_TABLET | Freq: Every day | ORAL | 0 refills | Status: AC

## 2024-11-22 MED ORDER — TORSEMIDE 20 MG PO TABS: 20.0000 mg | ORAL_TABLET | Freq: Every day | ORAL | 1 refills | Status: AC

## 2024-11-22 MED ORDER — METOPROLOL TARTRATE 25 MG PO TABS: 25.0000 mg | ORAL_TABLET | Freq: Two times a day (BID) | ORAL | 2 refills | Status: AC

## 2024-11-22 MED ORDER — AMLODIPINE BESYLATE 10 MG PO TABS: 10.0000 mg | ORAL_TABLET | Freq: Every day | ORAL | 2 refills | Status: AC

## 2024-11-22 MED ORDER — AMOXICILLIN-POT CLAVULANATE 500-125 MG PO TABS: 1.0000 | ORAL_TABLET | Freq: Two times a day (BID) | ORAL | 0 refills | Status: AC

## 2024-11-22 NOTE — Plan of Care (Signed)
   Problem: Clinical Measurements: Goal: Will remain free from infection Outcome: Progressing Goal: Respiratory complications will improve Outcome: Progressing

## 2024-11-22 NOTE — Discharge Summary (Signed)
 " Physician Discharge Summary   Patient: Curtis Marquez MRN: [id number] DOB: 1946/08/15  Admit date:     11/19/2024  Discharge date: {dischdate:26783}  Discharge Physician: Concepcion Riser   PCP: Maree Isles, MD   Recommendations at discharge:  {Tip this will not be part of the note when signed- Example include specific recommendations for outpatient follow-up, pending tests to follow-up on. (Optional):26781}  ***  Discharge Diagnoses: Active Problems:   Essential hypertension   Prostate cancer (HCC)   Hypoalbuminemia   Generalized weakness   Bilateral leg edema  Resolved Problems:   * No resolved hospital problems. [place] Course: No notes on file  Assessment and Plan: No notes have been filed under this hospital service. Service: Hospitalist     {Tip this will not be part of the note when signed Body mass index is 29.33 kg/m. , ,  (Optional):26781}  {(NOTE) Pain control PDMP Statment (Optional):26782} Consultants: *** Procedures performed: ***  Disposition: {Plan; Disposition:26390} Diet recommendation:  Discharge Diet Orders (From admission, onward)     Start     Ordered   11/22/24 0000  Diet - low sodium heart healthy        11/22/24 1014           {Diet_Plan:26776} DISCHARGE MEDICATION: Allergies as of 11/22/2024   No Known Allergies      Medication List     STOP taking these medications    furosemide  40 MG tablet Commonly known as: LASIX    hydrochlorothiazide  25 MG tablet Commonly known as: HYDRODIURIL    ibuprofen  800 MG tablet Commonly known as: ADVIL    losartan  50 MG tablet Commonly known as: COZAAR        TAKE these medications    acetaminophen  500 MG tablet Commonly known as: TYLENOL  Take 2 tablets (1,000 mg total) by mouth every 6 (six) hours.   amLODipine  10 MG tablet Commonly known as: NORVASC  Take 1 tablet (10 mg total) by mouth daily. Start taking on: November 23, 2024   amoxicillin -clavulanate 500-125 MG  tablet Commonly known as: Augmentin  Take 1 tablet by mouth 2 (two) times daily for 3 days.   metoprolol  tartrate 25 MG tablet Commonly known as: LOPRESSOR  Take 1 tablet (25 mg total) by mouth 2 (two) times daily.   potassium chloride  10 MEQ tablet Commonly known as: KLOR-CON  Take 2 tablets (20 mEq total) by mouth daily. What changed: how much to take   senna-docusate 8.6-50 MG tablet Commonly known as: Senokot-S Take 2 tablets by mouth at bedtime. What changed:  when to take this reasons to take this   torsemide  20 MG tablet Commonly known as: DEMADEX  Take 1 tablet (20 mg total) by mouth daily. Start taking on: November 23, 2024        Discharge Exam: Fredricka Weights   11/20/24 9374 11/21/24 0500 11/22/24 0500  Weight: 87.3 kg 85 kg 87.5 kg   ***  Condition at discharge: {DC Condition:26389}  The results of significant diagnostics from this hospitalization (including imaging, microbiology, ancillary and laboratory) are listed below for reference.   Imaging Studies: US  RENAL Result Date: 11/22/2024 CLINICAL DATA:  [id number] AKI (acute kidney injury) [id number] EXAM: RENAL / URINARY TRACT ULTRASOUND COMPLETE COMPARISON:  November 19, 2024 FINDINGS: Right Kidney: Renal measurements: 11.7 x 5.2 x 6.3 cm = volume: 199 mL. Echogenicity is mildly increased. No hydronephrosis visualized. Portions are suboptimally assessed secondary to shadowing bowel gas. Small benign cyst is noted measuring 16 mm (for which no dedicated imaging follow-up is recommended).  Additional larger cyst of the inferior pole seen on prior CT is not visualized sonographically due to shadowing bowel gas. Left Kidney: Renal measurements: 10.8 x 5.6 x 5.4 cm = volume: 171 mL. Echogenicity is upper limits of normal. No definitive mass or hydronephrosis visualized. Portions are suboptimally assessed secondary to shadowing bowel gas. Bladder: Appears normal for degree of bladder distention. Other: Ascites is noted. IMPRESSION:  1. No hydronephrosis. 2. Ascites. Electronically Signed   By: Corean Salter M.D.   On: 11/22/2024 07:27   ECHOCARDIOGRAM COMPLETE Result Date: 11/20/2024    ECHOCARDIOGRAM REPORT   Patient Name:   [name] Date of Exam: 11/20/2024 Medical Rec #:  [id number]  Height:       68.0 in Accession #:    [id number] Weight:       192.5 lb Date of Birth:  1946-03-29  BSA:          2.011 m Patient Age:    78 years   BP:           183/97 mmHg Patient Gender: M          HR:           97 bpm. Exam Location:  [facility] Procedure: 2D Echo, Cardiac Doppler and Color Doppler (Both Spectral and Color            Flow Doppler were utilized during procedure). Indications:    CHF  History:        Patient has prior history of Echocardiogram examinations, most                 recent 02/10/2016.  Sonographer:    Odella Brewster Referring Phys: [id number] TERRY LOISE HURST  Sonographer Comments: Image acquisition challenging due to respiratory motion. IMPRESSIONS  1. Left ventricular ejection fraction, by estimation, is 60 to 65%. The left ventricle has normal function. The left ventricle has no regional wall motion abnormalities. Left ventricular diastolic parameters are consistent with Grade I diastolic dysfunction (impaired relaxation).  2. Right ventricular systolic function is normal. The right ventricular size is normal. Tricuspid regurgitation signal is inadequate for assessing PA pressure.  3. Left atrial size was moderately dilated.  4. The mitral valve is grossly normal. Mild mitral valve regurgitation.  5. The aortic valve is tricuspid. There is mild calcification of the aortic valve. Aortic valve regurgitation is not visualized. Aortic valve sclerosis is present, with no evidence of aortic valve stenosis. Aortic valve mean gradient measures 3.0 mmHg.  6. Aortic dilatation noted. There is borderline dilatation of the aortic root, measuring 40 mm.  7. The inferior vena cava is normal in size with greater than 50% respiratory  variability, suggesting right atrial pressure of 3 mmHg. Comparison(s): Prior images unable to be directly viewed. SABRA FINDINGS  Left Ventricle: Left ventricular ejection fraction, by estimation, is 60 to 65%. The left ventricle has normal function. The left ventricle has no regional wall motion abnormalities. The left ventricular internal cavity size was normal in size. There is  borderline left ventricular hypertrophy. Left ventricular diastolic parameters are consistent with Grade I diastolic dysfunction (impaired relaxation). Right Ventricle: The right ventricular size is normal. No increase in right ventricular wall thickness. Right ventricular systolic function is normal. Tricuspid regurgitation signal is inadequate for assessing PA pressure. Left Atrium: Left atrial size was moderately dilated. Right Atrium: Right atrial size was normal in size. Pericardium: There is no evidence of pericardial effusion. Mitral Valve: The mitral valve is grossly normal. Mild  mitral valve regurgitation. MV peak gradient, 3.2 mmHg. The mean mitral valve gradient is 2.0 mmHg. Tricuspid Valve: The tricuspid valve is grossly normal. Tricuspid valve regurgitation is trivial. Aortic Valve: The aortic valve is tricuspid. There is mild calcification of the aortic valve. There is mild aortic valve annular calcification. Aortic valve regurgitation is not visualized. Aortic valve sclerosis is present, with no evidence of aortic valve stenosis. Aortic valve mean gradient measures 3.0 mmHg. Aortic valve peak gradient measures 5.9 mmHg. Aortic valve area, by VTI measures 2.87 cm. Pulmonic Valve: The pulmonic valve was not well visualized. Pulmonic valve regurgitation is trivial. Aorta: Aortic dilatation noted. There is borderline dilatation of the aortic root, measuring 40 mm. Venous: The inferior vena cava is normal in size with greater than 50% respiratory variability, suggesting right atrial pressure of 3 mmHg. IAS/Shunts: No atrial level  shunt detected by color flow Doppler. Additional Comments: 3D was performed not requiring image post processing on an independent workstation and was indeterminate.  LEFT VENTRICLE PLAX 2D LVIDd:         4.60 cm      Diastology LVIDs:         2.90 cm      LV e' medial:    5.98 cm/s LV PW:         1.20 cm      LV E/e' medial:  11.6 LV IVS:        0.90 cm      LV e' lateral:   8.38 cm/s LVOT diam:     2.30 cm      LV E/e' lateral: 8.3 LV SV:         53 LV SV Index:   26 LVOT Area:     4.15 cm LV IVRT:       79 msec  LV Volumes (MOD) LV vol d, MOD A2C: 130.0 ml LV vol d, MOD A4C: 119.0 ml LV vol s, MOD A2C: 59.1 ml LV vol s, MOD A4C: 56.0 ml LV SV MOD A2C:     70.9 ml LV SV MOD A4C:     119.0 ml LV SV MOD BP:      68.2 ml RIGHT VENTRICLE             IVC RV S prime:     13.80 cm/s  IVC diam: 1.60 cm TAPSE (M-mode): 1.5 cm                             PULMONARY VEINS                             Diastolic Velocity: 47.00 cm/s                             S/D Velocity:       1.10                             Systolic Velocity:  50.20 cm/s LEFT ATRIUM             Index        RIGHT ATRIUM           Index LA diam:        3.85 cm 1.91 cm/m   RA Area:     11.30 cm LA  Vol [facility]):   70.5 ml 35.06 ml/m  RA Volume:   25.20 ml  12.53 ml/m LA Vol (A4C):   90.0 ml 44.76 ml/m LA Biplane Vol: 85.2 ml 42.37 ml/m  AORTIC VALVE                    PULMONIC VALVE AV Area (Vmax):    3.01 cm     PV Vmax:          1.27 m/s AV Area (Vmean):   2.87 cm     PV Peak grad:     6.5 mmHg AV Area (VTI):     2.87 cm     PR End Diast Vel: 5.02 msec AV Vmax:           121.00 cm/s AV Vmean:          88.000 cm/s AV VTI:            0.185 m AV Peak Grad:      5.9 mmHg AV Mean Grad:      3.0 mmHg LVOT Vmax:         87.80 cm/s LVOT Vmean:        60.800 cm/s LVOT VTI:          0.128 m LVOT/AV VTI ratio: 0.69  AORTA Ao Root diam: 4.00 cm Ao Asc diam:  3.00 cm MITRAL VALVE MV Area (PHT): 3.66 cm    SHUNTS MV Area VTI:   3.66 cm    Systemic VTI:  0.13 m MV  Peak grad:  3.2 mmHg    Systemic Diam: 2.30 cm MV Mean grad:  2.0 mmHg MV Vmax:       0.90 m/s MV Vmean:      70.4 cm/s MV Decel Time: 207 msec MV E velocity: 69.20 cm/s MV A velocity: 75.50 cm/s MV E/A ratio:  0.92 Jayson Sierras MD Electronically signed by Jayson Sierras MD Signature Date/Time: 11/20/2024/10:31:11 AM    Final    CT ABDOMEN PELVIS W CONTRAST Result Date: 11/19/2024 EXAM: CT ABDOMEN AND PELVIS WITH CONTRAST 11/19/2024 07:27:43 PM TECHNIQUE: CT of the abdomen and pelvis was performed with the administration of 80 mL iohexol  (OMNIPAQUE ) 300 mg/mL solution. Multiplanar reformatted images are provided for review. Automated exposure control, iterative reconstruction, and/or weight-based adjustment of the mA/kV was utilized to reduce the radiation dose to as low as reasonably achievable. COMPARISON: Chest x-ray from 11/19/2024. CLINICAL HISTORY: Shortness of breath for several days. FINDINGS: LOWER CHEST: Large bilateral pleural effusions are noted. Bibasilar consolidation is noted slightly worse on the right than the left. LIVER: Perihepatic fluid is noted. GALLBLADDER AND BILE DUCTS: Gallbladder is unremarkable. No biliary ductal dilatation. SPLEEN: Perisplenic fluid is noted. PANCREAS: No acute abnormality. ADRENAL GLANDS: No acute abnormality. KIDNEYS, URETERS AND BLADDER: The kidneys show no renal calculi or obstructive changes. Cystic changes are noted within the right kidney. No follow-up is recommended. The bladder is decompressed. No perinephric or periureteral stranding. GI AND BOWEL: The stomach is unremarkable. Multiple edematous loops of small bowel are identified within in the ileum. The colon is predominantly decompressed. The appendix is within normal limits. No obstructive changes are seen. PERITONEUM AND RETROPERITONEUM: Ascites is noted extending into the left inguinal canal. No bowel is noted within. No free air. VASCULATURE: Aorta is normal in caliber. LYMPH NODES: No  lymphadenopathy. REPRODUCTIVE ORGANS: No acute abnormality. BONES AND SOFT TISSUES: Osseous structures are within normal limits. No focal soft tissue abnormality. IMPRESSION: 1. Large bilateral pleural  effusions with bibasilar consolidation, slightly worse on the right. 2. No evidence of bowel obstruction. 3. Ascites, including extension into the left inguinal canal without bowel involvement. 4. Multiple edematous loops of small bowel, particularly in the ileum, without obstruction. This may represent underlying inflammatory bowel disease. Electronically signed by: Oneil Devonshire MD 11/19/2024 07:38 PM EST RP Workstation: [id number]   DG Chest Port 1 View Result Date: 11/19/2024 EXAM: 1 VIEW(S) XRAY OF THE CHEST 11/19/2024 05:41:00 PM COMPARISON: 02/24/2021. CLINICAL HISTORY: Shortness of breath. FINDINGS: LUNGS AND PLEURA: Low lung volumes. Small bilateral pleural effusions. Bibasilar consolidations and atelectasis. Right mid lung opacity. No pneumothorax. HEART AND MEDIASTINUM: No acute abnormality of the cardiac and mediastinal silhouettes. BONES AND SOFT TISSUES: No acute osseous abnormality. IMPRESSION: 1. Bibasilar consolidations and atelectasis. 2. Small bilateral pleural effusions. 3. Right mid lung opacity, which may represent additional consolidation. Electronically signed by: Oneil Devonshire MD 11/19/2024 05:50 PM EST RP Workstation: [id number]    Microbiology: Results for orders placed or performed during the hospital encounter of 11/19/24  Resp panel by RT-PCR (RSV, Flu A&B, Covid) Anterior Nasal Swab     Status: None   Collection Time: 11/19/24  5:20 PM   Specimen: Anterior Nasal Swab  Result Value Ref Range Status   SARS Coronavirus 2 by RT PCR NEGATIVE NEGATIVE Final    Comment: (NOTE) SARS-CoV-2 target nucleic acids are NOT DETECTED.  The SARS-CoV-2 RNA is generally detectable in upper respiratory specimens during the acute phase of infection. The lowest concentration of SARS-CoV-2 viral  copies this assay can detect is 138 copies/mL. A negative result does not preclude SARS-Cov-2 infection and should not be used as the sole basis for treatment or other patient management decisions. A negative result may occur with  improper specimen collection/handling, submission of specimen other than nasopharyngeal swab, presence of viral mutation(s) within the areas targeted by this assay, and inadequate number of viral copies(<138 copies/mL). A negative result must be combined with clinical observations, patient history, and epidemiological information. The expected result is Negative.  Fact Sheet for Patients:  bloggercourse.com  Fact Sheet for Healthcare Providers:  seriousbroker.it  This test is no t yet approved or cleared by the [place]  FDA and  has been authorized for detection and/or diagnosis of SARS-CoV-2 by FDA under an Emergency Use Authorization (EUA). This EUA will remain  in effect (meaning this test can be used) for the duration of the COVID-19 declaration under Section 564(b)(1) of the Act, 21 U.S.C.section 360bbb-3(b)(1), unless the authorization is terminated  or revoked sooner.       Influenza A by PCR NEGATIVE NEGATIVE Final   Influenza B by PCR NEGATIVE NEGATIVE Final    Comment: (NOTE) The Xpert Xpress SARS-CoV-2/FLU/RSV plus assay is intended as an aid in the diagnosis of influenza from Nasopharyngeal swab specimens and should not be used as a sole basis for treatment. Nasal washings and aspirates are unacceptable for Xpert Xpress SARS-CoV-2/FLU/RSV testing.  Fact Sheet for Patients: bloggercourse.com  Fact Sheet for Healthcare Providers: seriousbroker.it  This test is not yet approved or cleared by the [place]  FDA and has been authorized for detection and/or diagnosis of SARS-CoV-2 by FDA under an Emergency Use Authorization (EUA). This  EUA will remain in effect (meaning this test can be used) for the duration of the COVID-19 declaration under Section 564(b)(1) of the Act, 21 U.S.C. section 360bbb-3(b)(1), unless the authorization is terminated or revoked.     Resp Syncytial Virus by PCR NEGATIVE NEGATIVE Final  Comment: (NOTE) Fact Sheet for Patients: bloggercourse.com  Fact Sheet for Healthcare Providers: seriousbroker.it  This test is not yet approved or cleared by the [place]  FDA and has been authorized for detection and/or diagnosis of SARS-CoV-2 by FDA under an Emergency Use Authorization (EUA). This EUA will remain in effect (meaning this test can be used) for the duration of the COVID-19 declaration under Section 564(b)(1) of the Act, 21 U.S.C. section 360bbb-3(b)(1), unless the authorization is terminated or revoked.  Performed at [facility], [address], [place], [place] [address]     Labs: CBC: Recent Labs  Lab 11/19/24 1719 11/20/24 0503 11/22/24 0846  WBC 11.5* 10.7* 8.2  NEUTROABS  --  9.1*  --   HGB 13.0 11.0* 10.8*  HCT 40.6 34.7* 33.4*  MCV 91.0 91.3 90.8  PLT 436* 356 343   Basic Metabolic Panel: Recent Labs  Lab 11/19/24 1719 11/20/24 0503 11/21/24 0825 11/22/24 0846  NA 144 143 145 143  K 3.8 3.6 3.2* 3.4*  CL 108 108 110 108  CO2 21* 21* 22 24  GLUCOSE 123* 118* 103* 103*  BUN 26* 32* 35* 32*  CREATININE 1.68* 1.88* 1.76* 1.63*  CALCIUM 8.4* 7.8* 7.4* 7.5*  MG  --  2.1  --   --   PHOS  --  5.3*  --   --    Liver Function Tests: Recent Labs  Lab 11/19/24 1719 11/20/24 0503  AST 18 15  ALT 11 9  ALKPHOS 132* 101  BILITOT 0.4 0.3  PROT 6.1* 4.9*  ALBUMIN 2.8* 2.4*   CBG: No results for input(s): GLUCAP in the last 168 hours.  Discharge time spent: {LESS THAN/GREATER UYJW:73611} 30 minutes.  Signed: Concepcion Riser, MD [facility] 11/22/2024 "
